# Patient Record
Sex: Female | Born: 1935 | Race: White | Hispanic: No | Smoking: Former smoker
Health system: Southern US, Community
[De-identification: ages and names within clinical notes are randomized; demographics above are authoritative.]

## PROBLEM LIST (undated history)

## (undated) ENCOUNTER — Inpatient Hospital Stay: Admission: AD | Payer: Self-pay | Admitting: Internal Medicine

## (undated) DIAGNOSIS — I4891 Unspecified atrial fibrillation: Secondary | ICD-10-CM

## (undated) DIAGNOSIS — E039 Hypothyroidism, unspecified: Secondary | ICD-10-CM

## (undated) DIAGNOSIS — E669 Obesity, unspecified: Secondary | ICD-10-CM

## (undated) DIAGNOSIS — I5031 Acute diastolic (congestive) heart failure: Secondary | ICD-10-CM

## (undated) DIAGNOSIS — F419 Anxiety disorder, unspecified: Secondary | ICD-10-CM

## (undated) DIAGNOSIS — IMO0002 Reserved for concepts with insufficient information to code with codable children: Secondary | ICD-10-CM

## (undated) DIAGNOSIS — Z90710 Acquired absence of both cervix and uterus: Secondary | ICD-10-CM

## (undated) DIAGNOSIS — I739 Peripheral vascular disease, unspecified: Secondary | ICD-10-CM

## (undated) DIAGNOSIS — I10 Essential (primary) hypertension: Secondary | ICD-10-CM

## (undated) DIAGNOSIS — F0390 Unspecified dementia without behavioral disturbance: Secondary | ICD-10-CM

## (undated) HISTORY — PX: HERNIA REPAIR: SHX51

## (undated) HISTORY — DX: Hypothyroidism, unspecified: E03.9

## (undated) HISTORY — DX: Essential (primary) hypertension: I10

## (undated) HISTORY — PX: ABDOMINAL HYSTERECTOMY: SHX81

## (undated) HISTORY — DX: Acute diastolic (congestive) heart failure: I50.31

## (undated) HISTORY — DX: Reserved for concepts with insufficient information to code with codable children: IMO0002

## (undated) HISTORY — PX: CHOLECYSTECTOMY: SHX55

## (undated) HISTORY — DX: Obesity, unspecified: E66.9

## (undated) HISTORY — DX: Acquired absence of both cervix and uterus: Z90.710

## (undated) HISTORY — DX: Anxiety disorder, unspecified: F41.9

---

## 1998-09-19 ENCOUNTER — Encounter: Payer: Self-pay | Admitting: Cardiology

## 2000-09-11 ENCOUNTER — Encounter: Payer: Self-pay | Admitting: Cardiology

## 2009-01-12 ENCOUNTER — Encounter: Payer: Self-pay | Admitting: Cardiology

## 2009-02-22 ENCOUNTER — Encounter: Payer: Self-pay | Admitting: Cardiology

## 2009-04-05 ENCOUNTER — Ambulatory Visit: Payer: Self-pay | Admitting: Cardiology

## 2009-04-05 DIAGNOSIS — R609 Edema, unspecified: Secondary | ICD-10-CM | POA: Insufficient documentation

## 2009-04-05 DIAGNOSIS — I10 Essential (primary) hypertension: Secondary | ICD-10-CM | POA: Insufficient documentation

## 2009-04-05 DIAGNOSIS — R0602 Shortness of breath: Secondary | ICD-10-CM

## 2009-04-05 DIAGNOSIS — Z8674 Personal history of sudden cardiac arrest: Secondary | ICD-10-CM

## 2009-04-06 ENCOUNTER — Telehealth (INDEPENDENT_AMBULATORY_CARE_PROVIDER_SITE_OTHER): Payer: Self-pay | Admitting: *Deleted

## 2009-04-17 ENCOUNTER — Ambulatory Visit: Payer: Self-pay | Admitting: Internal Medicine

## 2009-04-17 ENCOUNTER — Encounter: Payer: Self-pay | Admitting: Cardiology

## 2009-04-17 ENCOUNTER — Ambulatory Visit: Payer: Self-pay

## 2009-04-17 ENCOUNTER — Ambulatory Visit (HOSPITAL_COMMUNITY): Admission: RE | Admit: 2009-04-17 | Discharge: 2009-04-17 | Payer: Self-pay | Admitting: Cardiology

## 2009-04-18 LAB — CONVERTED CEMR LAB
CO2: 31 meq/L (ref 19–32)
Calcium: 9.7 mg/dL (ref 8.4–10.5)

## 2009-04-19 ENCOUNTER — Ambulatory Visit: Payer: Self-pay | Admitting: Cardiology

## 2009-04-19 DIAGNOSIS — R079 Chest pain, unspecified: Secondary | ICD-10-CM

## 2009-04-19 DIAGNOSIS — I426 Alcoholic cardiomyopathy: Secondary | ICD-10-CM

## 2009-04-26 LAB — CONVERTED CEMR LAB
GFR calc non Af Amer: 73.56 mL/min (ref >60)
Glucose, Bld: 104 mg/dL — ABNORMAL HIGH (ref 70–99)
Potassium: 4.1 meq/L (ref 3.5–5.1)
Sodium: 138 meq/L (ref 135–145)

## 2010-04-16 NOTE — Letter (Signed)
Summary: [facility] 1996 - 2002  [facility] 1996 - 2002   Imported By: Roderic Ovens 06/21/2009 16:47:59  _____________________________________________________________________  External Attachment:    Type:   Image     Comment:   External Document

## 2010-04-16 NOTE — Letter (Signed)
Summary: [facility] Surgical Clearance  [facility] Surgical Clearance   Imported By: Roderic Ovens 04/26/2009 11:54:46  _____________________________________________________________________  External Attachment:    Type:   Image     Comment:   External Document

## 2010-04-16 NOTE — Assessment & Plan Note (Signed)
Summary: f/u echo 04-17-09 md will see pt in  [address] office   Visit Type:  Follow-up Primary Provider:  [name], MD   History of Present Illness: 77 yo with history of HTN and diastolic CHF presents for cardiac evaluation prior to cataract surgery.  She has a history of multiple past surgeries for ? abdominal cysts (she is a little vague about exactly what the operations were).  During one of the operations, at [facility] in [place], she says that her "heart stopped."  She is not sure what was done, but she says they kept her in the hospital about 3 extra days.  This was 15-20 years ago.  She says that she was told afterwards to "take it easy" and "eat right."  She has never had any other heart trouble that she is aware of.  She has never passed out or had significant lightheadedness.  She does not get much exercise but does walk in the grocery store.    She is easily fatigued.  She can climb a flight of steps but is short of breath at the top.  Today she tells me that she gets occasional chest tightness along with shortness of breath when walking up an incline.    We have been trying to get records about this ? cardiac arrest from [place] but there is nothing in the records from [place] mentioning it and her prior primary care MD has no documentation. She needs cataract surgery.  Her daughter-in-law thinks that she will be under general anesthesia.    Echocardiogram showed normal LV systolic function with mild diastolic dysfunction.   Labs (10/10): K 4.3, creatinine 0.88, HCT 37.6, TSH normal Labs (2/11): K 4.3, creatinine 0.8, BNP 179  Current Medications (verified): 1)  Alprazolam 0.5 Mg Tabs (Alprazolam) .... Take One Tablet Two Times A Day 2)  Metoprolol Succinate 50 Mg Xr24h-Tab (Metoprolol Succinate) .... Take One Tablet Once Daily 3)  Benazepril-Hydrochlorothiazide 20-12.5 Mg Tabs (Benazepril-Hydrochlorothiazide) .... Take Two  Tablets  Once  Daily 4)  Vitamin D (Ergocalciferol) 50000 Unit Caps (Ergocalciferol) .... Take One Tablet Weekly 5)  Levothyroxine Sodium 100 Mcg Tabs (Levothyroxine Sodium) .... Take One Tablet By Mouth Once Daily 6)  Ketoconazole 200 Mg Tabs (Ketoconazole) .... Take 1/2 Tablet Two Times A Day 7)  Furosemide 40 Mg Tabs (Furosemide) .... Take One Tablet By Mouth Daily. 8)  Aspir-Low 81 Mg Tbec (Aspirin) .... One Tablet Daily 9)  Potassium Chloride Crys Cr 20 Meq Cr-Tabs (Potassium Chloride Crys Cr) .... Take One Tablet By Mouth Daily  Allergies: 1)  ! Morphine 2)  ! Codeine 3)  ! Valium 4)  ! Demerol 5)  ! Penicillin  Past History:  Past Medical History: 1. HTN 2. Anxiety 3. hypothyroidism 4. TAH 5. CCY 6. Multiple surgeries for ? abdominal cysts in [place] 7. "Heart stopped" on the operating table during one of her "cyst" operations, probably 15-20 years ago 8. Obesity.  9. Diastolic CHF: BNP 179 in 1/11.  Echo (1/11) with EF 60%, mild focal basal septal hypertrophy, grade I diasotlic dysfunction, mild left atrial enlargement.   Family History: Reviewed history from 04/05/2009 and no changes required. Father died at 76 from "heart disease" (she does not know any more details.  Son has had stents placed.  She has 2 brothers, neither has had coronary disease.   Social History: Reviewed history from 04/05/2009 and no changes required. Widow, originally from [place], now lives in [place].  She  lives with her son.  Her grandson and his wife live nearby.  She quit smoking at age 44.    Review of Systems       All systems reviewed and negative except as per HPI.   Vital Signs:  Patient profile:   77 year old female Height:      68 inches Weight:      215 pounds BMI:     32.81 Pulse rate:   76 / minute BP sitting:   136 / 82  (left arm)  Vitals Entered By: Laurance Flatten CMA (April 19, 2009 10:01 AM)  Physical Exam  General:  Well developed, well nourished, in no acute distress.  Obese.  Neck:  Neck supple, JVP 7-8 cm. No masses, thyromegaly or abnormal cervical nodes. Lungs:  Clear bilaterally to auscultation and percussion. Heart:  Non-displaced PMI, chest non-tender; regular rate and rhythm, S1, S2 without  rubs or gallops. Carotid upstroke normal, no bruit. 2+ edema 2/3 to knees left > right lower legs.  Difficult to feel pedal pulses due to edema.   Abdomen:  Bowel sounds positive; abdomen soft and non-tender without masses, organomegaly, or hernias noted. No hepatosplenomegaly. Extremities:  No clubbing or cyanosis. Neurologic:  Alert and oriented x 3. Psych:  Normal affect.   Impression & Recommendations:  Problem # 1:  CHEST PAIN UNSPECIFIED (ICD-786.50) Patient now reports occasional episodes of exertional chest tightness (usually when going up an incline).  She will continue ASA.  I will risk stratify her with a Lexiscan myoview given diastolic CHF, exertional chest tightness, and need for cataract surgery with history of ? perioperative cardiac arrest.   Problem # 2:  PERSONAL HISTORY OF SUDDEN CARDIAC ARREST (ICD-V12.53) Patient has history of "heart stopping" 15-20 years ago during surgery.  I do not have any further details of this.  We have tried to get records from [place] about this but have not been able to find anything either from [facility] or her primary MD in [place] (Dr. Christophe Louis).  I think that the safest course, especially given her diastolic CHF and occasional exertional chest tightness, will be to do a Lexiscan myoview to assess for any ischemia.  Her echo showed normal LV systolic function and mild diastolic dysfunction.  If the myoview is low risk, she should be able to undergo surgery with no further workup.    Problem # 3:  DYSPNEA (ICD-786.05) Suspect diastolic CHF given elevated BNP and normal EF by echo.  I will have her increase Lasix to 40 mg daily as she does appear somewhat volume overloaded.  Will add KCl 20  mEq daily.  BMET and BNP in 2 wks.   Other Orders: Nuclear Stress Test (Nuc Stress Test) TLB-BMP (Basic Metabolic Panel-BMET) (80048-METABOL) TLB-BNP (B-Natriuretic Peptide) (83880-BNPR)  Patient Instructions: 1)  Your physician recommends that you schedule a follow-up appointment in: follow up 1 month at [place] [facility] 2)  Your physician has recommended you make the following change in your medication: please increase lasix to 40mg  once daily and KCL 20mg  1tab by mouth qd 3)  Your physician wants you to follow-up in:   You will receive a reminder letter in the mail two months in advance. If you don't receive a letter, please call our office to schedule the follow-up appointment. 4)  Your physician has requested that you have an exercise stress myoview.  For further information please visit [email or web address]  Please follow instruction sheet, as given. Prescriptions: POTASSIUM  CHLORIDE CRYS CR 20 MEQ CR-TABS (POTASSIUM CHLORIDE CRYS CR) Take one tablet by mouth daily  #30 x 10   Entered by:   Ledon Snare, RN   Authorized by:   Marca Ancona, MD   Signed by:   Ledon Snare, RN on 04/19/2009   Method used:   [facility]  [place] [address]       [address]       [place], [place]  [address]       Ph: [phone] or [phone]       Fax: [phone]   RxID:   [id number] FUROSEMIDE 40 MG TABS (FUROSEMIDE) Take one tablet by mouth daily.  #30 x 10   Entered by:   Ledon Snare, RN   Authorized by:   Marca Ancona, MD   Signed by:   Ledon Snare, RN on 04/19/2009   Method used:   [facility]        [facility]  [place] [address] [facility]       [address]       [place], [place]  [address]       Ph: [phone] or [phone]       Fax: [phone]   RxID:   [id number]

## 2010-04-16 NOTE — Progress Notes (Signed)
  Faxed ROi over today, Recieved Records today forwarded to [place]. Cala Bradford Mesiemore  April 06, 2009 11:26 AM

## 2010-04-16 NOTE — Assessment & Plan Note (Signed)
Summary: NP6/CARDIAC EVAL/JML   Primary Provider:  [name], MD  CC:  new patient.  Cardiac evaluation.  Pt went to have procedure on eye and was referred for cardiac consult as she had a prior surgery during which her heart stopped.  Tina West  History of Present Illness: 77 yo with history of HTN presents for cardiac evaluation prior to cataract surgery.  She has a history of multiple past surgeries for ? abdominal cysts (she is a little vague about exactly what the operations were).  During one of the operations, at [facility] [place] in [place], she says that her "heart stopped."  She is not sure what was done, but she says they kept her in the hospital about 3 extra days.  This was 15-20 years ago.  She says that she was told afterwards to "take it easy" and "eat right."  She has never had any other heart trouble that she is aware of.  She has never had exertional chest pain.  She gets a fluttering sensation in her chest when she is excited or under stress.  She has never passed out or had significant lightheadedness.  She does not get much exercise but does walk in the grocery store.  She is easily fatigued.  She can climb a flight of steps but is short of breath at the top.   ECG: NSR, normal  Labs (10/10): K 4.3, creatinine 0.88, HCT 37.6, TSH normal  Current Medications (verified): 1)  Alprazolam 0.5 Mg Tabs (Alprazolam) .... Take One Tablet Two Times A Day 2)  Folic Acid 1 Mg Tabs (Folic Acid) .... Take One Tablet Once Daily 3)  Metoprolol Succinate 50 Mg Xr24h-Tab (Metoprolol Succinate) .... Take One Tablet Once Daily 4)  Benazepril-Hydrochlorothiazide 20-12.5 Mg Tabs (Benazepril-Hydrochlorothiazide) .... Take Two  Tablets  Once Daily 5)  Vitamin D (Ergocalciferol) 50000 Unit Caps (Ergocalciferol) .... Take One Tablet Weekly 6)  Levothyroxine Sodium 100 Mcg Tabs (Levothyroxine Sodium) .... Take One Tablet By Mouth Once Daily 7)  Ketoconazole 200 Mg Tabs (Ketoconazole) .... Take 1/2  Tablet Two Times A Day 8)  Furosemide 20 Mg Tabs (Furosemide) .... Take One Tablet Once Daily 9)  Aspir-Low 81 Mg Tbec (Aspirin) .... One Tablet Daily  Allergies (verified): 1)  ! Morphine 2)  ! Codeine 3)  ! Valium 4)  ! Demerol 5)  ! Penicillin  Past History:  Past Medical History: 1. HTN 2. Anxiety 3. hypothyroidism 4. TAH 5. CCY 6. Multiple surgeries for ? abdominal cysts in [place] 7. "Heart stopped" on the operating table during one of her "cyst" operations, probably 15-20 years ago 8. Obesity.   Family History: Father died at 28 from "heart disease" (she does not know any more details.  Son has had stents placed.  She has 2 brothers, neither has had coronary disease.   Social History: Widow, originally from [place], now lives in [place].  She lives with her son.  Her grandson and his wife live nearby.  She quit smoking at age 89.    Review of Systems       All systems reviewed and negative except as per HPI.   Vital Signs:  Patient profile:   77 year old female Height:      68 inches Weight:      220 pounds BMI:     33.57 Pulse rate:   70 / minute Pulse rhythm:   regular BP sitting:   160 / 94  (right arm) Cuff size:  large  Vitals Entered By: Judithe Modest CMA (April 05, 2009 2:59 PM)  Physical Exam  General:  Well developed, well nourished, in no acute distress. Obese.  Head:  normocephalic and atraumatic Nose:  no deformity, discharge, inflammation, or lesions Mouth:  Teeth, gums and palate normal. Oral mucosa normal. Neck:  Neck supple, no JVD. No masses, thyromegaly or abnormal cervical nodes. Lungs:  Clear bilaterally to auscultation and percussion. Heart:  Non-displaced PMI, chest non-tender; regular rate and rhythm, S1, S2 without  rubs or gallops. Carotid upstroke normal, no bruit. 2+ edema 2/3 to knees left > right lower legs.  Difficult to feel pedal pulses due to edema.   Abdomen:  Bowel sounds positive; abdomen soft and non-tender  without masses, organomegaly, or hernias noted. No hepatosplenomegaly. Msk:  Back normal, normal gait. Muscle strength and tone normal. Extremities:  No clubbing or cyanosis. Neurologic:  Alert and oriented x 3. Skin:  Intact without lesions or rashes. Psych:  Normal affect.   Impression & Recommendations:  Problem # 1:  PERSONAL HISTORY OF SUDDEN CARDIAC ARREST (ICD-V12.53) Patient has history of "heart stopping" 15-20 years ago during surgery.  I do not have any further details of this.  Before she undergoes anesthesia again for her cataract surgery, we will need more details of exactly what happened at that time.  We will try to obtain the records from [place].  In the mean time, I will have her start ASA 81 mg daily and will get an echocardiogram to assess LV function.  There is no definite indication at this time for a stress test but she may need one based on the records from [place] or based on echo findings.    Problem # 2:  DYSPNEA (ICD-786.05) Patient has mild dyspnea on exertion and quite significant lower extremity edema.  Her JVP is not elevated, so the edema may be more due to obesity and venous insufficiency.  Will assess LV systolic and diastolic function on echo.  Will get a BNP.    Problem # 3:  HYPERTENSION, UNSPECIFIED (ICD-401.9) BP is elevated today to 160/94 and was 178/73 when she saw her PCP last.  I will have her increase her lisinopril/HCTZ to 40/25.  Will get a BMET in 10 days.    Patient will followup in 2 weeks.  Will get her last lipids from Dr [facility] office.   Other Orders: EKG w/ Interpretation (93[address]) Echocardiogram (Echo)  Patient Instructions: 1)  Your physician has recommended you make the following change in your medication:  2)  Increase Lisinopril/HCT to 20/12.5 two tablets daily--this will be 40/25mg  daily 3)  Start Apirin 81mg  daily 4)  Your physician has requested that you have an echocardiogram.  Echocardiography is a painless test that uses  sound waves to create images of your heart. It provides your doctor with information about the size and shape of your heart and how well your heart's chambers and valves are working.  This procedure takes approximately one hour. There are no restrictions for this procedure.  Tuesday February 1,2011 at 10:30 5)  Your physician recommends that you return for lab work  in 10days when you have the echocardiogram  BMP/BNP/783.3 v58.69  Tuesday February 1,2011 at 10:30 6)  Your physician recommends that you schedule a follow-up appointment in: 2 weeks with Dr Dario Ave 3,2011 10am Prescriptions: BENAZEPRIL-HYDROCHLOROTHIAZIDE 20-12.5 MG TABS (BENAZEPRIL-HYDROCHLOROTHIAZIDE) take two  tablets  once daily  #60 x 3   Entered by:   Katina Dung, RN,  BSN   Authorized by:   Marca Ancona, MD   Signed by:   Katina Dung, RN, BSN on 04/05/2009   Method used:   [facility]  [place] [address]       [address]       [place], [place]  [address]       Ph: [phone] or [phone]       Fax: [phone]   RxID:   [id number]

## 2010-04-16 NOTE — Progress Notes (Signed)
Summary: Patients Hand Written Medical History  Patients Medical History   Imported By: Roderic Ovens 04/26/2009 10:14:30  _____________________________________________________________________  External Attachment:    Type:   Image     Comment:   External Document

## 2010-04-16 NOTE — Letter (Signed)
Summary: [facility], [place], [place] 9147 - 2002  [facility] 200 - 2002   Imported By: Roderic Ovens 04/26/2009 10:16:23  _____________________________________________________________________  External Attachment:    Type:   Image     Comment:   External Document

## 2010-10-29 ENCOUNTER — Encounter: Payer: Self-pay | Admitting: Cardiology

## 2012-07-28 ENCOUNTER — Other Ambulatory Visit: Payer: Self-pay

## 2012-07-28 MED ORDER — FUROSEMIDE 40 MG PO TABS: 40.0000 mg | ORAL_TABLET | Freq: Every day | ORAL | Status: DC

## 2012-10-19 ENCOUNTER — Other Ambulatory Visit: Payer: Self-pay | Admitting: Family Medicine

## 2014-07-14 ENCOUNTER — Encounter (INDEPENDENT_AMBULATORY_CARE_PROVIDER_SITE_OTHER): Payer: Self-pay

## 2014-07-14 ENCOUNTER — Ambulatory Visit (INDEPENDENT_AMBULATORY_CARE_PROVIDER_SITE_OTHER): Payer: Medicare PPO | Admitting: Family Medicine

## 2014-07-14 ENCOUNTER — Encounter: Payer: Self-pay | Admitting: Family Medicine

## 2014-07-14 VITALS — BP 205/104 | HR 102 | Temp 97.1°F | Ht 64.0 in | Wt 173.0 lb

## 2014-07-14 DIAGNOSIS — E038 Other specified hypothyroidism: Secondary | ICD-10-CM | POA: Diagnosis not present

## 2014-07-14 DIAGNOSIS — I10 Essential (primary) hypertension: Secondary | ICD-10-CM | POA: Diagnosis not present

## 2014-07-14 LAB — POCT CBC
GRANULOCYTE PERCENT: 53.6 % (ref 37–80)
HEMATOCRIT: 40.1 % (ref 37.7–47.9)
Hemoglobin: 12.3 g/dL (ref 12.2–16.2)
Lymph, poc: 2 (ref 0.6–3.4)
MCH: 25.9 pg — AB (ref 27–31.2)
MCHC: 30.6 g/dL — AB (ref 31.8–35.4)
MCV: 84.5 fL (ref 80–97)
MPV: 7.7 fL (ref 0–99.8)
PLATELET COUNT, POC: 490 10*3/uL — AB (ref 142–424)
POC GRANULOCYTE: 2.9 (ref 2–6.9)
POC LYMPH PERCENT: 37.6 %L (ref 10–50)
RBC: 4.75 M/uL (ref 4.04–5.48)
RDW, POC: 15.1 %
WBC: 5.4 10*3/uL (ref 4.6–10.2)

## 2014-07-14 MED ORDER — ALPRAZOLAM 0.25 MG PO TABS: 0.2500 mg | ORAL_TABLET | Freq: Two times a day (BID) | ORAL | Status: AC

## 2014-07-14 MED ORDER — DONEPEZIL HCL 5 MG PO TABS: 5.0000 mg | ORAL_TABLET | Freq: Every day | ORAL | Status: DC

## 2014-07-14 NOTE — Progress Notes (Signed)
Subjective:  Patient ID: Tina West, female    DOB: 08-06-1928  Age: 81 y.o. MRN: [id number]  CC: Hypertension and GAD   HPI Tina West presents for  follow-up of hypertension. Patient has no history of headache chest pain or shortness of breath or recent cough. Patient also denies symptoms of TIA such as numbness weakness lateralizing. Patient checks  blood pressure at home and has not had any elevated readings recently. Patient denies side effects from his medication. States taking it regularly.  Patient presents for follow-up on  thyroid. She has a history of hypothyroidism for many years. It has been stable recently. Pt. denies any change in  voice, loss of hair, heat or cold intolerance. Energy level has been adequate to good. She denies constipation and diarrhea. No myxedema. Medication is as noted below. Verified that pt is taking it daily on an empty stomach. Well tolerated.  PAranoia toward family stealing.     History Tina West has a past medical history of HTN (hypertension); Anxiety; Hypothyroidism; S/P TAH (total abdominal hysterectomy); Abdominal cyst; Obesity; and Diastolic CHF, acute.   She has past surgical history that includes Abdominal hysterectomy; Cholecystectomy; and Hernia repair.   Her family history includes Heart disease in her father.She reports that she has quit smoking. She does not have any smokeless tobacco history on file. Her alcohol and drug histories are not on file.  Current Outpatient Prescriptions on File Prior to Visit  Medication Sig Dispense Refill  . aspirin (ASPIR-LOW) 81 MG EC tablet Take 81 mg by mouth daily.      . benazepril-hydrochlorthiazide (LOTENSIN HCT) 20-12.5 MG per tablet Take 1 tablet by mouth 2 (two) times daily.      . ergocalciferol (VITAMIN D2) 50000 UNITS capsule Take 50,000 Units by mouth once a week.      . furosemide (LASIX) 40 MG tablet TAKE 1 TABLET (40 MG TOTAL) BY MOUTH DAILY. (Patient not taking: Reported on  07/14/2014) 30 tablet 2  . ketoconazole (NIZORAL) 200 MG tablet Take 100 mg by mouth 2 (two) times daily.      Marland Kitchen levothyroxine (SYNTHROID, LEVOTHROID) 100 MCG tablet Take 100 mcg by mouth daily.      . metoprolol (TOPROL-XL) 50 MG 24 hr tablet Take 50 mg by mouth daily.      . potassium chloride SA (K-DUR,KLOR-CON) 20 MEQ tablet Take 20 mEq by mouth daily.       No current facility-administered medications on file prior to visit.    ROS Review of Systems  Constitutional: Negative for fever, chills, diaphoresis, appetite change, fatigue and unexpected weight change.  HENT: Negative for congestion, ear pain, hearing loss, postnasal drip, rhinorrhea, sneezing, sore throat and trouble swallowing.   Eyes: Negative for pain.  Respiratory: Negative for cough, chest tightness and shortness of breath.   Cardiovascular: Negative for chest pain and palpitations.  Gastrointestinal: Negative for nausea, vomiting, abdominal pain, diarrhea and constipation.  Genitourinary: Negative for dysuria, frequency and menstrual problem.  Musculoskeletal: Negative for joint swelling and arthralgias.  Skin: Negative for rash.  Neurological: Negative for dizziness, weakness, numbness and headaches.  Psychiatric/Behavioral: Negative for dysphoric mood and agitation.    Objective:  BP 205/104 mmHg  Pulse 102  Temp(Src) 97.1 F (36.2 C) (Oral)  Ht 5\' 4"  (1.626 m)  Wt 173 lb (78.472 kg)  BMI 29.68 kg/m2  BP Readings from Last 3 Encounters:  07/14/14 205/104  04/19/09 136/82  04/05/09 160/94    Wt Readings from Last 3 Encounters:  07/14/14 173 lb (78.472 kg)  04/19/09 215 lb (97.523 kg)  04/05/09 220 lb (99.791 kg)     Physical Exam  Constitutional: She is oriented to person, place, and time. She appears well-developed and well-nourished. No distress.  HENT:  Head: Normocephalic and atraumatic.  Right Ear: External ear normal.  Left Ear: External ear normal.  Nose: Nose normal.  Mouth/Throat:  Oropharynx is clear and moist.  Eyes: Conjunctivae and EOM are normal. Pupils are equal, round, and reactive to light.  Neck: Normal range of motion. Neck supple. No thyromegaly present.  Cardiovascular: Normal rate, regular rhythm and normal heart sounds.   No murmur heard. Pulmonary/Chest: Effort normal and breath sounds normal. No respiratory distress. She has no wheezes. She has no rales.  Abdominal: Soft. Bowel sounds are normal. She exhibits no distension. There is no tenderness.  Lymphadenopathy:    She has no cervical adenopathy.  Neurological: She is alert and oriented to person, place, and time. She has normal reflexes.  Skin: Skin is warm and dry.  Psychiatric: She has a normal mood and affect. Her behavior is normal. Judgment and thought content normal.    No results found for: HGBA1C  Lab Results  Component Value Date   GLUCOSE 104* 04/19/2009   NA 138 04/19/2009   K 4.1 04/19/2009   CL 103 04/19/2009   CREATININE 0.8 04/19/2009   BUN 18 04/19/2009   CO2 30 04/19/2009    No results found.  Assessment & Plan:   Tina West was seen today for hypertension and gad.  Diagnoses and all orders for this visit:  Essential hypertension  Other specified hypothyroidism  Other orders -     donepezil (ARICEPT) 5 MG tablet; Take 1 tablet (5 mg total) by mouth at bedtime. -     ALPRAZolam (XANAX) 0.25 MG tablet; Take 1 tablet (0.25 mg total) by mouth 2 (two) times daily.  I have changed Tina West's ALPRAZolam. I am also having her start on donepezil. Additionally, I am having her maintain her aspirin, benazepril-hydrochlorthiazide, ketoconazole, levothyroxine, metoprolol succinate, potassium chloride SA, ergocalciferol, and furosemide.  Meds ordered this encounter  Medications  . donepezil (ARICEPT) 5 MG tablet    Sig: Take 1 tablet (5 mg total) by mouth at bedtime.    Dispense:  30 tablet    Refill:  2  . ALPRAZolam (XANAX) 0.25 MG tablet    Sig: Take 1 tablet (0.25  mg total) by mouth 2 (two) times daily.    Dispense:  60 tablet    Refill:  2     Follow-up: Return in about 6 weeks (around 08/25/2014).  Claretta Fraise, M.D.

## 2014-07-15 LAB — CMP14+EGFR
A/G RATIO: 1.9 (ref 1.1–2.5)
ALT: 11 IU/L (ref 0–32)
AST: 16 IU/L (ref 0–40)
Albumin: 4.6 g/dL (ref 3.5–4.7)
Alkaline Phosphatase: 74 IU/L (ref 39–117)
BUN/Creatinine Ratio: 19 (ref 11–26)
BUN: 13 mg/dL (ref 8–27)
Bilirubin Total: 0.9 mg/dL (ref 0.0–1.2)
CALCIUM: 9.7 mg/dL (ref 8.7–10.3)
CO2: 24 mmol/L (ref 18–29)
Chloride: 98 mmol/L (ref 97–108)
Creatinine, Ser: 0.69 mg/dL (ref 0.57–1.00)
GFR calc Af Amer: 92 mL/min/{1.73_m2} (ref >59)
GFR calc non Af Amer: 80 mL/min/{1.73_m2} (ref >59)
Globulin, Total: 2.4 g/dL (ref 1.5–4.5)
Glucose: 99 mg/dL (ref 65–99)
POTASSIUM: 4.2 mmol/L (ref 3.5–5.2)
SODIUM: 139 mmol/L (ref 134–144)
Total Protein: 7 g/dL (ref 6.0–8.5)

## 2014-07-15 LAB — THYROID PANEL WITH TSH
Free Thyroxine Index: 1.2 (ref 1.2–4.9)
T3 UPTAKE RATIO: 21 % — AB (ref 24–39)
T4 TOTAL: 5.5 ug/dL (ref 4.5–12.0)
TSH: 10.23 u[IU]/mL — ABNORMAL HIGH (ref 0.450–4.500)

## 2014-07-16 ENCOUNTER — Other Ambulatory Visit: Payer: Self-pay | Admitting: Family Medicine

## 2014-07-16 MED ORDER — LEVOTHYROXINE SODIUM 125 MCG PO TABS: 125.0000 ug | ORAL_TABLET | Freq: Every day | ORAL | Status: DC

## 2014-07-16 NOTE — Addendum Note (Signed)
Addended by: Claretta Fraise on: 07/16/2014 10:19 PM   Modules accepted: Miquel Dunn

## 2014-08-21 ENCOUNTER — Ambulatory Visit (INDEPENDENT_AMBULATORY_CARE_PROVIDER_SITE_OTHER): Payer: Medicare PPO | Admitting: Family Medicine

## 2014-08-21 ENCOUNTER — Encounter (INDEPENDENT_AMBULATORY_CARE_PROVIDER_SITE_OTHER): Payer: Self-pay

## 2014-08-21 ENCOUNTER — Encounter: Payer: Self-pay | Admitting: Family Medicine

## 2014-08-21 VITALS — BP 178/108 | HR 107 | Temp 97.3°F | Ht 64.0 in | Wt 170.0 lb

## 2014-08-21 DIAGNOSIS — I10 Essential (primary) hypertension: Secondary | ICD-10-CM | POA: Diagnosis not present

## 2014-08-21 DIAGNOSIS — E038 Other specified hypothyroidism: Secondary | ICD-10-CM | POA: Diagnosis not present

## 2014-08-21 DIAGNOSIS — G309 Alzheimer's disease, unspecified: Secondary | ICD-10-CM | POA: Diagnosis not present

## 2014-08-21 DIAGNOSIS — F0280 Dementia in other diseases classified elsewhere without behavioral disturbance: Secondary | ICD-10-CM

## 2014-08-21 MED ORDER — DONEPEZIL HCL 10 MG PO TABS: 5.0000 mg | ORAL_TABLET | Freq: Every day | ORAL | Status: DC

## 2014-08-21 MED ORDER — LEVOTHYROXINE SODIUM 125 MCG PO TABS: 125.0000 ug | ORAL_TABLET | Freq: Every day | ORAL | Status: DC

## 2014-08-21 NOTE — Progress Notes (Signed)
Subjective:  Patient ID: Tina West, female    DOB: [birth year]  Age: 81 y.o. MRN: [id number]  CC: No chief complaint on file.   HPI Tina West presents for Follow up of use of donepezil denies any side effects of the medication. Feels that is helping her mind to clear somewhat.  Patient presents for follow-up on  thyroid. She has a history of hypothyroidism for many years. Patient did not increase the medication as recommended after previous visit.. Pt. denies any change in  voice, loss of hair, heat or cold intolerance. Energy level has been adequate to good. She denies constipation and diarrhea. No myxedema. Medication is still 100 g by mouth daily rather than the stated dose noted below.. Verified that pt is taking it daily on an empty stomach. Well tolerated.  History Tina West has a past medical history of HTN (hypertension); Anxiety; Hypothyroidism; S/P TAH (total abdominal hysterectomy); Abdominal cyst; Obesity; and Diastolic CHF, acute.   She has past surgical history that includes Abdominal hysterectomy; Cholecystectomy; and Hernia repair.   Her family history includes Heart disease in her father.She reports that she has quit smoking. She does not have any smokeless tobacco history on file. Her alcohol and drug histories are not on file.  Outpatient Prescriptions Prior to Visit  Medication Sig Dispense Refill  . ALPRAZolam (XANAX) 0.25 MG tablet Take 1 tablet (0.25 mg total) by mouth 2 (two) times daily. 60 tablet 2  . aspirin (ASPIR-LOW) 81 MG EC tablet Take 81 mg by mouth daily.      . benazepril-hydrochlorthiazide (LOTENSIN HCT) 20-12.5 MG per tablet Take 1 tablet by mouth 2 (two) times daily.      . metoprolol (TOPROL-XL) 50 MG 24 hr tablet Take 50 mg by mouth daily.      Marland Kitchen donepezil (ARICEPT) 5 MG tablet Take 1 tablet (5 mg total) by mouth at bedtime. 30 tablet 2  . ergocalciferol (VITAMIN D2) 50000 UNITS capsule Take 50,000 Units by mouth once a week.      .  furosemide (LASIX) 40 MG tablet TAKE 1 TABLET (40 MG TOTAL) BY MOUTH DAILY. (Patient not taking: Reported on 07/14/2014) 30 tablet 2  . ketoconazole (NIZORAL) 200 MG tablet Take 100 mg by mouth 2 (two) times daily.      . potassium chloride SA (K-DUR,KLOR-CON) 20 MEQ tablet Take 20 mEq by mouth daily.      Marland Kitchen levothyroxine (SYNTHROID, LEVOTHROID) 125 MCG tablet Take 1 tablet (125 mcg total) by mouth daily. (Patient not taking: Reported on 08/21/2014) 30 tablet 2   No facility-administered medications prior to visit.    ROS Review of Systems  Constitutional: Negative for fever, chills, diaphoresis, appetite change, fatigue and unexpected weight change.  HENT: Negative for congestion, ear pain, hearing loss, postnasal drip, rhinorrhea, sneezing, sore throat and trouble swallowing.   Eyes: Negative for pain.  Respiratory: Negative for cough, chest tightness and shortness of breath.   Cardiovascular: Negative for chest pain and palpitations.  Gastrointestinal: Negative for nausea, vomiting, abdominal pain, diarrhea and constipation.  Genitourinary: Negative for dysuria, frequency and menstrual problem.  Musculoskeletal: Negative for joint swelling and arthralgias.  Skin: Negative for rash.  Neurological: Negative for dizziness, weakness, numbness and headaches.  Psychiatric/Behavioral: Positive for behavioral problems and confusion. Negative for dysphoric mood and agitation.    Objective:  BP 178/108 mmHg  Pulse 107  Temp(Src) 97.3 F (36.3 C) (Oral)  Ht 5\' 4"  (1.626 m)  Wt 170 lb (77.111 kg)  BMI 29.17  kg/m2  BP Readings from Last 3 Encounters:  08/21/14 178/108  07/14/14 205/104  04/19/09 136/82    Wt Readings from Last 3 Encounters:  08/21/14 170 lb (77.111 kg)  07/14/14 173 lb (78.472 kg)  04/19/09 215 lb (97.523 kg)     Physical Exam  Constitutional: She is oriented to person, place, and time. She appears well-developed and well-nourished. No distress.  HENT:  Head:  Normocephalic and atraumatic.  Right Ear: External ear normal.  Left Ear: External ear normal.  Nose: Nose normal.  Mouth/Throat: Oropharynx is clear and moist.  Eyes: Conjunctivae and EOM are normal. Pupils are equal, round, and reactive to light.  Neck: Normal range of motion. Neck supple. No thyromegaly present.  Cardiovascular: Normal rate, regular rhythm and normal heart sounds.   No murmur heard. Pulmonary/Chest: Effort normal and breath sounds normal. No respiratory distress. She has no wheezes. She has no rales.  Abdominal: Soft. Bowel sounds are normal. She exhibits no distension. There is no tenderness.  Musculoskeletal:  dorsal kyphosis  Lymphadenopathy:    She has no cervical adenopathy.  Neurological: She is alert and oriented to person, place, and time. She has normal reflexes.  Skin: Skin is warm and dry.  Psychiatric: She has a normal mood and affect. Her behavior is normal.  Patient has tangential thinking and unable to remain on topic. However she is cognizant and responds appropriately with appropriate memory but impaired judgment and association    No results found for: HGBA1C  Lab Results  Component Value Date   WBC 5.4 07/14/2014   HGB 12.3 07/14/2014   HCT 40.1 07/14/2014   GLUCOSE 99 07/14/2014   ALT 11 07/14/2014   AST 16 07/14/2014   NA 139 07/14/2014   K 4.2 07/14/2014   CL 98 07/14/2014   CREATININE 0.69 07/14/2014   BUN 13 07/14/2014   CO2 24 07/14/2014   TSH 10.230* 07/14/2014    No results found.  Assessment & Plan:   Diagnoses and all orders for this visit:  Other specified hypothyroidism  Essential hypertension  Alzheimer's disease  Other orders -     levothyroxine (SYNTHROID, LEVOTHROID) 125 MCG tablet; Take 1 tablet (125 mcg total) by mouth daily. -     donepezil (ARICEPT) 10 MG tablet; Take 0.5 tablets (5 mg total) by mouth at bedtime.  I have changed Tina West's donepezil. I am also having her maintain her aspirin,  benazepril-hydrochlorthiazide, ketoconazole, metoprolol succinate, potassium chloride SA, ergocalciferol, furosemide, ALPRAZolam, and levothyroxine.  Meds ordered this encounter  Medications  . levothyroxine (SYNTHROID, LEVOTHROID) 125 MCG tablet    Sig: Take 1 tablet (125 mcg total) by mouth daily.    Dispense:  30 tablet    Refill:  2  . donepezil (ARICEPT) 10 MG tablet    Sig: Take 0.5 tablets (5 mg total) by mouth at bedtime.    Dispense:  30 tablet    Refill:  2   Encouraged patient to take the dose of medication as previously directed.  Follow-up: Return in about 2 months (around 10/21/2014) for thyroid.  Claretta Fraise, M.D.

## 2014-09-19 ENCOUNTER — Encounter: Payer: Self-pay | Admitting: *Deleted

## 2014-10-23 ENCOUNTER — Ambulatory Visit: Payer: Medicare PPO | Admitting: Family Medicine

## 2014-10-25 ENCOUNTER — Ambulatory Visit (INDEPENDENT_AMBULATORY_CARE_PROVIDER_SITE_OTHER): Payer: Medicare PPO | Admitting: Family Medicine

## 2014-10-25 ENCOUNTER — Encounter: Payer: Self-pay | Admitting: Family Medicine

## 2014-10-25 VITALS — BP 187/101 | HR 104 | Temp 97.2°F | Ht 64.0 in | Wt 162.4 lb

## 2014-10-25 DIAGNOSIS — I10 Essential (primary) hypertension: Secondary | ICD-10-CM

## 2014-10-25 DIAGNOSIS — E038 Other specified hypothyroidism: Secondary | ICD-10-CM | POA: Diagnosis not present

## 2014-10-25 MED ORDER — FUROSEMIDE 40 MG PO TABS: ORAL_TABLET | ORAL | Status: DC

## 2014-10-25 MED ORDER — BENAZEPRIL-HYDROCHLOROTHIAZIDE 20-12.5 MG PO TABS: 1.0000 | ORAL_TABLET | Freq: Two times a day (BID) | ORAL | Status: DC

## 2014-10-25 MED ORDER — METOPROLOL SUCCINATE ER 100 MG PO TB24: 100.0000 mg | ORAL_TABLET | Freq: Every day | ORAL | Status: DC

## 2014-10-25 MED ORDER — METOPROLOL SUCCINATE ER 50 MG PO TB24: 50.0000 mg | ORAL_TABLET | Freq: Every day | ORAL | Status: DC

## 2014-10-25 NOTE — Progress Notes (Addendum)
Subjective:  Patient ID: Tina West, female    DOB: 09/04/1928  Age: 81 y.o. MRN: [id number]  CC: Hypertension and Hypothyroidism   HPI Tina West presents for  follow-up of hypertension. Patient has no history of headache chest pain or shortness of breath or recent cough. Patient also denies symptoms of TIA such as numbness weakness lateralizing Patient denies side effects from his medication. States taking it regularly. Patient presents for follow-up on  thyroid. She has a history of hypothyroidism for many years. It has been stable recently. Pt. denies any change in  voice, loss of hair, heat or cold intolerance. Energy level has been adequate to good. She denies constipation and diarrhea. No myxedema. Medication is as noted below. Verified that pt is taking it daily on an empty stomach. Well tolerated.  History Tina West has a past medical history of HTN (hypertension); Anxiety; Hypothyroidism; S/P TAH (total abdominal hysterectomy); Abdominal cyst; Obesity; and Diastolic CHF, acute.   She has past surgical history that includes Abdominal hysterectomy; Cholecystectomy; and Hernia repair.   Her family history includes Heart disease in her father.She reports that she has quit smoking. She does not have any smokeless tobacco history on file. Her alcohol and drug histories are not on file.  Outpatient Prescriptions Prior to Visit  Medication Sig Dispense Refill  . ALPRAZolam (XANAX) 0.25 MG tablet Take 1 tablet (0.25 mg total) by mouth 2 (two) times daily. (Patient not taking: Reported on 10/25/2014) 60 tablet 2  . aspirin (ASPIR-LOW) 81 MG EC tablet Take 81 mg by mouth daily.      Marland Kitchen donepezil (ARICEPT) 10 MG tablet Take 0.5 tablets (5 mg total) by mouth at bedtime. 30 tablet 2  . ergocalciferol (VITAMIN D2) 50000 UNITS capsule Take 50,000 Units by mouth once a week.      Marland Kitchen ketoconazole (NIZORAL) 200 MG tablet Take 100 mg by mouth 2 (two) times daily.      . potassium chloride SA  (K-DUR,KLOR-CON) 20 MEQ tablet Take 20 mEq by mouth daily.      . benazepril-hydrochlorthiazide (LOTENSIN HCT) 20-12.5 MG per tablet Take 1 tablet by mouth 2 (two) times daily.      . furosemide (LASIX) 40 MG tablet TAKE 1 TABLET (40 MG TOTAL) BY MOUTH DAILY. (Patient not taking: Reported on 10/25/2014) 30 tablet 2  . levothyroxine (SYNTHROID, LEVOTHROID) 125 MCG tablet Take 1 tablet (125 mcg total) by mouth daily. 30 tablet 2  . metoprolol (TOPROL-XL) 50 MG 24 hr tablet Take 50 mg by mouth daily.       No facility-administered medications prior to visit.    ROS Review of Systems  Constitutional: Negative for fever, chills, diaphoresis, appetite change, fatigue and unexpected weight change.  HENT: Negative for congestion, ear pain, hearing loss, postnasal drip, rhinorrhea, sneezing, sore throat and trouble swallowing.   Eyes: Negative for pain.  Respiratory: Negative for cough, chest tightness and shortness of breath.   Cardiovascular: Negative for chest pain and palpitations.  Gastrointestinal: Negative for nausea, vomiting, abdominal pain, diarrhea and constipation.  Genitourinary: Negative for dysuria, frequency and menstrual problem.  Musculoskeletal: Negative for joint swelling and arthralgias.  Skin: Negative for rash.  Neurological: Negative for dizziness, weakness, numbness and headaches.  Psychiatric/Behavioral: Negative for dysphoric mood and agitation.    Objective:  BP 187/101 mmHg  Pulse 104  Temp(Src) 97.2 F (36.2 C) (Oral)  Ht 5' 4"  (1.626 m)  Wt 162 lb 6.4 oz (73.664 kg)  BMI 27.86 kg/m2  BP Readings  from Last 3 Encounters:  10/25/14 187/101  08/21/14 178/108  07/14/14 205/104    Wt Readings from Last 3 Encounters:  10/25/14 162 lb 6.4 oz (73.664 kg)  08/21/14 170 lb (77.111 kg)  07/14/14 173 lb (78.472 kg)     Physical Exam  Constitutional: She is oriented to person, place, and time. She appears well-developed and well-nourished. No distress.  HENT:    Head: Normocephalic and atraumatic.  Right Ear: External ear normal.  Left Ear: External ear normal.  Nose: Nose normal.  Mouth/Throat: Oropharynx is clear and moist.  Eyes: Conjunctivae and EOM are normal. Pupils are equal, round, and reactive to light.  Neck: Normal range of motion. Neck supple. No thyromegaly present.  Cardiovascular: Normal rate, regular rhythm and normal heart sounds.   No murmur heard. Pulmonary/Chest: Effort normal and breath sounds normal. No respiratory distress. She has no wheezes. She has no rales.  Abdominal: Soft. Bowel sounds are normal. She exhibits no distension. There is no tenderness.  Lymphadenopathy:    She has no cervical adenopathy.  Neurological: She is alert and oriented to person, place, and time. She has normal reflexes.  Skin: Skin is warm and dry.  Psychiatric: She has a normal mood and affect. Her behavior is normal. Judgment and thought content normal.    No results found for: HGBA1C  Lab Results  Component Value Date   WBC 5.4 07/14/2014   HGB 12.3 07/14/2014   HCT 40.1 07/14/2014   GLUCOSE 88 10/25/2014   ALT 7 10/25/2014   AST 21 10/25/2014   NA 140 10/25/2014   K 4.6 10/25/2014   CL 99 10/25/2014   CREATININE 0.69 10/25/2014   BUN 24 10/25/2014   CO2 21 10/25/2014   TSH 6.890* 10/25/2014    No results found.  Assessment & Plan:   Tina West was seen today for hypertension and hypothyroidism.  Diagnoses and all orders for this visit:  Accelerated hypertension  Other specified hypothyroidism -     TSH -     T4, Free  Essential hypertension -     CMP14+EGFR  Other orders -     benazepril-hydrochlorthiazide (LOTENSIN HCT) 20-12.5 MG per tablet; Take 1 tablet by mouth 2 (two) times daily. -     furosemide (LASIX) 40 MG tablet; TAKE 1 TABLET (40 MG TOTAL) BY MOUTH DAILY. -     Discontinue: metoprolol succinate (TOPROL-XL) 50 MG 24 hr tablet; Take 1 tablet (50 mg total) by mouth daily. -     metoprolol succinate  (TOPROL-XL) 100 MG 24 hr tablet; Take 1 tablet (100 mg total) by mouth daily.   I have discontinued [name]'s metoprolol succinate. I have also changed her metoprolol succinate. Additionally, I am having her maintain her aspirin, ketoconazole, potassium chloride SA, ergocalciferol, ALPRAZolam, donepezil, benazepril-hydrochlorthiazide, and furosemide.  Meds ordered this encounter  Medications  . DISCONTD: donepezil (ARICEPT) 5 MG tablet    Sig:   . benazepril-hydrochlorthiazide (LOTENSIN HCT) 20-12.5 MG per tablet    Sig: Take 1 tablet by mouth 2 (two) times daily.    Dispense:  60 tablet    Refill:  11  . furosemide (LASIX) 40 MG tablet    Sig: TAKE 1 TABLET (40 MG TOTAL) BY MOUTH DAILY.    Dispense:  30 tablet    Refill:  5  . DISCONTD: metoprolol succinate (TOPROL-XL) 50 MG 24 hr tablet    Sig: Take 1 tablet (50 mg total) by mouth daily.    Dispense:  30  tablet    Refill:  11  . metoprolol succinate (TOPROL-XL) 100 MG 24 hr tablet    Sig: Take 1 tablet (100 mg total) by mouth daily.    Dispense:  30 tablet    Refill:  2     Follow-up: Return in about 2 weeks (around 11/08/2014) for hypertension.  Claretta Fraise, M.D.

## 2014-10-26 ENCOUNTER — Other Ambulatory Visit: Payer: Self-pay | Admitting: Family Medicine

## 2014-10-26 ENCOUNTER — Encounter: Payer: Self-pay | Admitting: *Deleted

## 2014-10-26 LAB — CMP14+EGFR
A/G RATIO: 1.9 (ref 1.1–2.5)
ALK PHOS: 66 IU/L (ref 39–117)
ALT: 7 IU/L (ref 0–32)
AST: 21 IU/L (ref 0–40)
Albumin: 4.2 g/dL (ref 3.5–4.7)
BUN / CREAT RATIO: 35 — AB (ref 11–26)
BUN: 24 mg/dL (ref 8–27)
Bilirubin Total: 0.5 mg/dL (ref 0.0–1.2)
CO2: 21 mmol/L (ref 18–29)
Calcium: 9.5 mg/dL (ref 8.7–10.3)
Chloride: 99 mmol/L (ref 97–108)
Creatinine, Ser: 0.69 mg/dL (ref 0.57–1.00)
GFR calc Af Amer: 92 mL/min/{1.73_m2} (ref >59)
GFR calc non Af Amer: 80 mL/min/{1.73_m2} (ref >59)
GLOBULIN, TOTAL: 2.2 g/dL (ref 1.5–4.5)
Glucose: 88 mg/dL (ref 65–99)
Potassium: 4.6 mmol/L (ref 3.5–5.2)
Sodium: 140 mmol/L (ref 134–144)
Total Protein: 6.4 g/dL (ref 6.0–8.5)

## 2014-10-26 LAB — TSH: TSH: 6.89 u[IU]/mL — AB (ref 0.450–4.500)

## 2014-10-26 LAB — T4, FREE: Free T4: 0.86 ng/dL (ref 0.82–1.77)

## 2014-10-26 MED ORDER — LEVOTHYROXINE SODIUM 137 MCG PO TABS: 137.0000 ug | ORAL_TABLET | Freq: Every day | ORAL | Status: DC

## 2014-10-26 NOTE — Addendum Note (Signed)
Addended by: Claretta Fraise on: 10/26/2014 07:46 PM   Modules accepted: Miquel Dunn

## 2014-11-08 ENCOUNTER — Ambulatory Visit: Payer: Self-pay | Admitting: Family Medicine

## 2014-11-10 ENCOUNTER — Ambulatory Visit (INDEPENDENT_AMBULATORY_CARE_PROVIDER_SITE_OTHER): Payer: Medicare PPO | Admitting: Family Medicine

## 2014-11-10 ENCOUNTER — Encounter: Payer: Self-pay | Admitting: Family Medicine

## 2014-11-10 VITALS — BP 134/72 | HR 68 | Temp 97.0°F | Ht 64.0 in | Wt 164.0 lb

## 2014-11-10 DIAGNOSIS — F0280 Dementia in other diseases classified elsewhere without behavioral disturbance: Secondary | ICD-10-CM

## 2014-11-10 DIAGNOSIS — E038 Other specified hypothyroidism: Secondary | ICD-10-CM | POA: Diagnosis not present

## 2014-11-10 DIAGNOSIS — I10 Essential (primary) hypertension: Secondary | ICD-10-CM | POA: Diagnosis not present

## 2014-11-10 DIAGNOSIS — B372 Candidiasis of skin and nail: Secondary | ICD-10-CM | POA: Diagnosis not present

## 2014-11-10 DIAGNOSIS — G309 Alzheimer's disease, unspecified: Secondary | ICD-10-CM

## 2014-11-10 MED ORDER — METOPROLOL SUCCINATE ER 100 MG PO TB24: 100.0000 mg | ORAL_TABLET | Freq: Every day | ORAL | Status: DC

## 2014-11-10 MED ORDER — DONEPEZIL HCL 10 MG PO TABS: 5.0000 mg | ORAL_TABLET | Freq: Every day | ORAL | Status: AC

## 2014-11-10 NOTE — Patient Instructions (Signed)
Use lamisil cream for rash in skin fold of mid abdomen

## 2014-11-10 NOTE — Progress Notes (Signed)
Subjective:  Patient ID: Tina West, female    DOB: 04-08-1928  Age: 81 y.o. MRN: [id number]  CC: Hypertension and Hypothyroidism   HPI Geneva Barrero presents for  follow-up of hypertension. Patient has no history of headache chest pain or shortness of breath or recent cough. Patient also denies symptoms of TIA such as numbness weakness lateralizing. Patient not checking blood pressure at home. Patient denies side effects from his medication. States taking it regularly.  Patient presents for follow-up on  thyroid. She has a history of hypothyroidism for many years. It has been stable recently. Pt. denies any change in  voice, loss of hair, heat or cold intolerance. Energy level has been adequate to good. She denies constipation and diarrhea. No myxedema. Medication is as noted below. Verified that pt is taking it daily on an empty stomach. Well tolerated.  Patient become quite nervous and has feelings of panic and impending doom at times. This is well controlled as long she takes the alprazolam. She continues to be rather forgetful. Today she cannot recall the date or day of the week. She does know location. She can recall names. The pain or nor is she expressed in the past has resolved since starting treatment with donepezil.   History Pinky has a past medical history of HTN (hypertension); Anxiety; Hypothyroidism; S/P TAH (total abdominal hysterectomy); Abdominal cyst; Obesity; and Diastolic CHF, acute.   She has past surgical history that includes Abdominal hysterectomy; Cholecystectomy; and Hernia repair.   Her family history includes Heart disease in her father.She reports that she has quit smoking. She does not have any smokeless tobacco history on file. Her alcohol and drug histories are not on file.  Current Outpatient Prescriptions on File Prior to Visit  Medication Sig Dispense Refill  . ALPRAZolam (XANAX) 0.25 MG tablet Take 1 tablet (0.25 mg total) by mouth 2 (two)  times daily. 60 tablet 2  . aspirin (ASPIR-LOW) 81 MG EC tablet Take 81 mg by mouth daily.      . benazepril-hydrochlorthiazide (LOTENSIN HCT) 20-12.5 MG per tablet Take 1 tablet by mouth 2 (two) times daily. 60 tablet 11  . ergocalciferol (VITAMIN D2) 50000 UNITS capsule Take 50,000 Units by mouth once a week.      . furosemide (LASIX) 40 MG tablet TAKE 1 TABLET (40 MG TOTAL) BY MOUTH DAILY. 30 tablet 5  . ketoconazole (NIZORAL) 200 MG tablet Take 100 mg by mouth 2 (two) times daily.      . potassium chloride SA (K-DUR,KLOR-CON) 20 MEQ tablet Take 20 mEq by mouth daily.      Marland Kitchen levothyroxine (SYNTHROID, LEVOTHROID) 137 MCG tablet Take 1 tablet (137 mcg total) by mouth daily. (Patient not taking: Reported on 11/10/2014) 30 tablet 5   No current facility-administered medications on file prior to visit.    ROS Review of Systems  Constitutional: Negative for fever, chills, diaphoresis, appetite change, fatigue and unexpected weight change.  HENT: Negative for congestion, ear pain, hearing loss, postnasal drip, rhinorrhea, sneezing, sore throat and trouble swallowing.   Eyes: Negative for pain.  Respiratory: Negative for cough, chest tightness and shortness of breath.   Cardiovascular: Negative for chest pain and palpitations.  Gastrointestinal: Negative for nausea, vomiting, abdominal pain, diarrhea and constipation.  Genitourinary: Negative for dysuria, frequency and menstrual problem.  Musculoskeletal: Negative for joint swelling and arthralgias.  Skin: Positive for rash.  Neurological: Negative for dizziness, weakness, numbness and headaches.  Psychiatric/Behavioral: Positive for confusion. Negative for dysphoric mood and agitation.  The patient is nervous/anxious.     Objective:  BP 134/72 mmHg  Pulse 68  Temp(Src) 97 F (36.1 C) (Oral)  Ht 5\' 4"  (1.626 m)  Wt 164 lb (74.39 kg)  BMI 28.14 kg/m2  BP Readings from Last 3 Encounters:  11/10/14 134/72  10/25/14 187/101  08/21/14  178/108    Wt Readings from Last 3 Encounters:  11/10/14 164 lb (74.39 kg)  10/25/14 162 lb 6.4 oz (73.664 kg)  08/21/14 170 lb (77.111 kg)     Physical Exam  Constitutional: She is oriented to person, place, and time. She appears well-developed and well-nourished. No distress.  HENT:  Head: Normocephalic and atraumatic.  Right Ear: External ear normal.  Left Ear: External ear normal.  Nose: Nose normal.  Mouth/Throat: Oropharynx is clear and moist.  Eyes: Conjunctivae and EOM are normal. Pupils are equal, round, and reactive to light.  Neck: Normal range of motion. Neck supple. No thyromegaly present.  Cardiovascular: Normal rate, regular rhythm and normal heart sounds.   No murmur heard. Pulmonary/Chest: Effort normal and breath sounds normal. No respiratory distress. She has no wheezes. She has no rales.  Abdominal: Soft. Bowel sounds are normal. She exhibits no distension. There is no tenderness.  Lymphadenopathy:    She has no cervical adenopathy.  Neurological: She is alert and oriented to person, place, and time. She has normal reflexes.  Skin: Skin is warm and dry. Rash (ntertriginous satellite erythema noted under her breasts bilaterally) noted.  Psychiatric: She has a normal mood and affect. Her behavior is normal.  Unusual affect noted.    No results found for: HGBA1C  Lab Results  Component Value Date   WBC 5.4 07/14/2014   HGB 12.3 07/14/2014   HCT 40.1 07/14/2014   GLUCOSE 88 10/25/2014   ALT 7 10/25/2014   AST 21 10/25/2014   NA 140 10/25/2014   K 4.6 10/25/2014   CL 99 10/25/2014   CREATININE 0.69 10/25/2014   BUN 24 10/25/2014   CO2 21 10/25/2014   TSH 6.890* 10/25/2014    No results found.  Assessment & Plan:   Jenice was seen today for hypertension and hypothyroidism.  Diagnoses and all orders for this visit:  Essential hypertension  Other specified hypothyroidism  Alzheimer's dementia  Yeast dermatitis  Other orders -      donepezil (ARICEPT) 10 MG tablet; Take 0.5 tablets (5 mg total) by mouth at bedtime. -     metoprolol succinate (TOPROL-XL) 100 MG 24 hr tablet; Take 1 tablet (100 mg total) by mouth daily.   I am having Ms. Dross maintain her aspirin, ketoconazole, potassium chloride SA, ergocalciferol, ALPRAZolam, benazepril-hydrochlorthiazide, furosemide, levothyroxine, donepezil, and metoprolol succinate.  Meds ordered this encounter  Medications  . DISCONTD: metoprolol succinate (TOPROL-XL) 50 MG 24 hr tablet    Sig:   . donepezil (ARICEPT) 10 MG tablet    Sig: Take 0.5 tablets (5 mg total) by mouth at bedtime.    Dispense:  30 tablet    Refill:  5  . metoprolol succinate (TOPROL-XL) 100 MG 24 hr tablet    Sig: Take 1 tablet (100 mg total) by mouth daily.    Dispense:  30 tablet    Refill:  5     Follow-up: Return in about 2 months (around 01/10/2015).  Claretta Fraise, M.D.

## 2015-01-12 ENCOUNTER — Encounter: Payer: Self-pay | Admitting: Family Medicine

## 2015-01-12 ENCOUNTER — Ambulatory Visit (INDEPENDENT_AMBULATORY_CARE_PROVIDER_SITE_OTHER): Payer: Medicare PPO | Admitting: Family Medicine

## 2015-01-12 VITALS — BP 139/67 | HR 67 | Temp 97.0°F | Ht 64.0 in | Wt 165.6 lb

## 2015-01-12 DIAGNOSIS — F0280 Dementia in other diseases classified elsewhere without behavioral disturbance: Secondary | ICD-10-CM | POA: Diagnosis not present

## 2015-01-12 DIAGNOSIS — E038 Other specified hypothyroidism: Secondary | ICD-10-CM

## 2015-01-12 DIAGNOSIS — I10 Essential (primary) hypertension: Secondary | ICD-10-CM | POA: Diagnosis not present

## 2015-01-12 DIAGNOSIS — G309 Alzheimer's disease, unspecified: Secondary | ICD-10-CM

## 2015-01-12 NOTE — Patient Instructions (Signed)
Use colace, a stool softener once or twice every day so stools won't hurt so bad.

## 2015-01-12 NOTE — Progress Notes (Signed)
Subjective:  Patient ID: Tina West, female    DOB: [birth year]  Age: 81 y.o. MRN: [id number]  CC: Hypertension and Hypothyroidism   HPI Tina West presents for  follow-up of hypertension. Patient has no history of headache chest pain or shortness of breath or recent cough. Patient also denies symptoms of TIA such as numbness weakness lateralizing. Patient checks  blood pressure at home and has not had any elevated readings recently. Patient denies side effects from his medication. States taking it regularly.  Patient presents for follow-up on  thyroid. She has a history of hypothyroidism for many years. It has been under active recently. Pt. denies any change in  voice, loss of hair, heat or cold intolerance. Energy level has been adequate to good. She does get tired fast.  She denies constipation and diarrhea. No myxedema. Medication is as noted below. Verified that pt is taking it daily on an empty stomach. Well tolerated. Dosage recently adjusted. See medical history.    History Tina West has a past medical history of HTN (hypertension); Anxiety; Hypothyroidism; S/P TAH (total abdominal hysterectomy); Abdominal cyst; Obesity; and Diastolic CHF, acute ([place]).   She has past surgical history that includes Abdominal hysterectomy; Cholecystectomy; and Hernia repair.   Her family history includes Heart disease in her father.She reports that she has quit smoking. She does not have any smokeless tobacco history on file. Her alcohol and drug histories are not on file.  Current Outpatient Prescriptions on File Prior to Visit  Medication Sig Dispense Refill  . ALPRAZolam (XANAX) 0.25 MG tablet Take 1 tablet (0.25 mg total) by mouth 2 (two) times daily. 60 tablet 2  . aspirin (ASPIR-LOW) 81 MG EC tablet Take 81 mg by mouth daily.      . benazepril-hydrochlorthiazide (LOTENSIN HCT) 20-12.5 MG per tablet Take 1 tablet by mouth 2 (two) times daily. 60 tablet 11  . donepezil (ARICEPT) 10 MG  tablet Take 0.5 tablets (5 mg total) by mouth at bedtime. 30 tablet 5  . furosemide (LASIX) 40 MG tablet TAKE 1 TABLET (40 MG TOTAL) BY MOUTH DAILY. 30 tablet 5  . ketoconazole (NIZORAL) 200 MG tablet Take 100 mg by mouth 2 (two) times daily.      Marland Kitchen levothyroxine (SYNTHROID, LEVOTHROID) 137 MCG tablet Take 1 tablet (137 mcg total) by mouth daily. 30 tablet 5  . metoprolol succinate (TOPROL-XL) 100 MG 24 hr tablet Take 1 tablet (100 mg total) by mouth daily. 30 tablet 5  . potassium chloride SA (K-DUR,KLOR-CON) 20 MEQ tablet Take 20 mEq by mouth daily.       No current facility-administered medications on file prior to visit.    ROS Review of Systems  Constitutional: Negative for fever, chills, diaphoresis, appetite change, fatigue and unexpected weight change.  HENT: Negative for congestion, ear pain, hearing loss, postnasal drip, rhinorrhea, sneezing, sore throat and trouble swallowing.   Eyes: Negative for pain.  Respiratory: Negative for cough, chest tightness and shortness of breath.   Cardiovascular: Negative for chest pain and palpitations.  Gastrointestinal: Positive for constipation. Negative for nausea, vomiting, abdominal pain and diarrhea.  Genitourinary: Negative for dysuria, frequency and menstrual problem.  Musculoskeletal: Negative for joint swelling and arthralgias.  Skin: Negative for rash.  Neurological: Negative for dizziness, weakness, numbness and headaches.  Psychiatric/Behavioral: Negative for suicidal ideas, hallucinations, sleep disturbance, dysphoric mood and agitation. The patient is nervous/anxious.     Objective:  BP 139/67 mmHg  Pulse 67  Temp(Src) 97 F (36.1 C) (Oral)  Ht _0  (1.626 m)  Wt 165 lb 9.6 oz (75.116 kg)  BMI 28.41 kg/m2  BP Readings from Last 3 Encounters:  01/12/15 139/67  11/10/14 134/72  10/25/14 187/101    Wt Readings from Last 3 Encounters:  01/12/15 165 lb 9.6 oz (75.116 kg)  11/10/14 164 lb (74.39 kg)  10/25/14 162 lb 6.4  oz (73.664 kg)     Physical Exam  Constitutional: She is oriented to person, place, and time. She appears well-developed and well-nourished. No distress.  HENT:  Head: Normocephalic and atraumatic.  Right Ear: External ear normal.  Left Ear: External ear normal.  Nose: Nose normal.  Mouth/Throat: Oropharynx is clear and moist.  Eyes: Conjunctivae and EOM are normal. Pupils are equal, round, and reactive to light.  Neck: Normal range of motion. Neck supple. No thyromegaly present.  Cardiovascular: Normal rate, regular rhythm and normal heart sounds.   No murmur heard. Pulmonary/Chest: Effort normal and breath sounds normal. No respiratory distress. She has no wheezes. She has no rales.  Abdominal: Soft. Bowel sounds are normal. She exhibits no distension. There is no tenderness.  Lymphadenopathy:    She has no cervical adenopathy.  Neurological: She is alert and oriented to person, place, and time. She has normal reflexes.  Skin: Skin is warm and dry.  Psychiatric: She has a normal mood and affect. Her speech is tangential. She is slowed. Thought content is delusional. Cognition and memory are impaired.    No results found for: HGBA1C  Lab Results  Component Value Date   WBC 5.4 07/14/2014   HGB 12.3 07/14/2014   HCT 40.1 07/14/2014   GLUCOSE 88 10/25/2014   ALT 7 10/25/2014   AST 21 10/25/2014   NA 140 10/25/2014   K 4.6 10/25/2014   CL 99 10/25/2014   CREATININE 0.69 10/25/2014   BUN 24 10/25/2014   CO2 21 10/25/2014   TSH 6.890* 10/25/2014    No results found.  Assessment & Plan:   Tina West was seen today for hypertension and hypothyroidism.  Diagnoses and all orders for this visit:  Essential hypertension -     TSH -     T4, Free -     CMP14+EGFR  Other specified hypothyroidism -     TSH -     T4, Free -     CMP14+EGFR  Alzheimer's dementia -     TSH -     T4, Free -     CMP14+EGFR   I have discontinued Tina West's ergocalciferol. I am also  having her maintain her aspirin, ketoconazole, potassium chloride SA, ALPRAZolam, benazepril-hydrochlorthiazide, furosemide, levothyroxine, donepezil, and metoprolol succinate.  No orders of the defined types were placed in this encounter.     Follow-up: Return in about 6 months (around 07/13/2015) for hypertension, Hypothyroidism.  Claretta Fraise, M.D.

## 2015-04-18 ENCOUNTER — Ambulatory Visit (INDEPENDENT_AMBULATORY_CARE_PROVIDER_SITE_OTHER): Payer: Medicare PPO | Admitting: Family Medicine

## 2015-04-18 ENCOUNTER — Encounter: Payer: Self-pay | Admitting: Family Medicine

## 2015-04-18 VITALS — Temp 96.8°F | Ht 64.0 in | Wt 164.2 lb

## 2015-04-18 DIAGNOSIS — E038 Other specified hypothyroidism: Secondary | ICD-10-CM | POA: Diagnosis not present

## 2015-04-18 DIAGNOSIS — G309 Alzheimer's disease, unspecified: Secondary | ICD-10-CM | POA: Diagnosis not present

## 2015-04-18 DIAGNOSIS — F0280 Dementia in other diseases classified elsewhere without behavioral disturbance: Secondary | ICD-10-CM | POA: Diagnosis not present

## 2015-04-18 DIAGNOSIS — I10 Essential (primary) hypertension: Secondary | ICD-10-CM | POA: Diagnosis not present

## 2015-04-18 LAB — POCT URINALYSIS DIPSTICK
BILIRUBIN UA: NEGATIVE
GLUCOSE UA: NEGATIVE
Ketones, UA: NEGATIVE
Nitrite, UA: NEGATIVE
PH UA: 6
Protein, UA: NEGATIVE
RBC UA: NEGATIVE
SPEC GRAV UA: 1.015
UROBILINOGEN UA: NEGATIVE

## 2015-04-18 NOTE — Progress Notes (Signed)
Subjective:  Patient ID: Tina West, female    DOB: 1928-11-23  Age: 82 y.o. MRN: [id number]  CC: Hypertension; Dementia; and Hypothyroidism   HPI Tina West presents for  follow-up of hypertension. Patient has no history of headache chest pain or shortness of breath or recent cough. Patient also denies symptoms of TIA such as numbness weakness lateralizing. Patient checks  blood pressure at home and has not had any elevated readings recently. Patient denies side effects from medication. States taking it regularly.  Patient presents for follow-up on  thyroid. She has a history of hypothyroidism for many years. Clinically  has been stable recently. Pt. denies any change in  voice, loss of hair, heat or cold intolerance. Energy level has been adequate to good. She denies constipation and diarrhea. No myxedema. Medication is as noted below. Verified that pt is taking it daily on an empty stomach. Well tolerated. She did have an elevated TSH at her last check. An interim visit was scheduled but patient failed to get TSH checked at that time. She is due for blood work today.   History Tina West has a past medical history of HTN (hypertension); Anxiety; Hypothyroidism; S/P TAH (total abdominal hysterectomy); Abdominal cyst; Obesity; and Diastolic CHF, acute ([place]).   She has past surgical history that includes Abdominal hysterectomy; Cholecystectomy; and Hernia repair.   Her family history includes Heart disease in her father.She reports that she has quit smoking. She does not have any smokeless tobacco history on file. Her alcohol and drug histories are not on file.  Current Outpatient Prescriptions on File Prior to Visit  Medication Sig Dispense Refill  . ALPRAZolam (XANAX) 0.25 MG tablet Take 1 tablet (0.25 mg total) by mouth 2 (two) times daily. 60 tablet 2  . aspirin (ASPIR-LOW) 81 MG EC tablet Take 81 mg by mouth daily.      . benazepril-hydrochlorthiazide (LOTENSIN HCT) 20-12.5 MG  per tablet Take 1 tablet by mouth 2 (two) times daily. 60 tablet 11  . donepezil (ARICEPT) 10 MG tablet Take 0.5 tablets (5 mg total) by mouth at bedtime. 30 tablet 5  . furosemide (LASIX) 40 MG tablet TAKE 1 TABLET (40 MG TOTAL) BY MOUTH DAILY. 30 tablet 5  . ketoconazole (NIZORAL) 200 MG tablet Take 100 mg by mouth 2 (two) times daily.      Marland Kitchen levothyroxine (SYNTHROID, LEVOTHROID) 137 MCG tablet Take 1 tablet (137 mcg total) by mouth daily. 30 tablet 5  . metoprolol succinate (TOPROL-XL) 100 MG 24 hr tablet Take 1 tablet (100 mg total) by mouth daily. 30 tablet 5  . potassium chloride SA (K-DUR,KLOR-CON) 20 MEQ tablet Take 20 mEq by mouth daily. Reported on 04/18/2015     No current facility-administered medications on file prior to visit.    ROS Review of Systems  Objective:  Temp(Src) 96.8 F (36 C) (Oral)  Ht _0  (1.626 m)  Wt 164 lb 3.2 oz (74.481 kg)  BMI 28.17 kg/m2  BP Readings from Last 3 Encounters:  01/12/15 139/67  11/10/14 134/72  10/25/14 187/101    Wt Readings from Last 3 Encounters:  04/18/15 164 lb 3.2 oz (74.481 kg)  01/12/15 165 lb 9.6 oz (75.116 kg)  11/10/14 164 lb (74.39 kg)     Physical Exam   Lab Results  Component Value Date   WBC 5.4 07/14/2014   HGB 12.3 07/14/2014   HCT 40.1 07/14/2014   GLUCOSE 88 10/25/2014   ALT 7 10/25/2014   AST 21 10/25/2014  NA 140 10/25/2014   K 4.6 10/25/2014   CL 99 10/25/2014   CREATININE 0.69 10/25/2014   BUN 24 10/25/2014   CO2 21 10/25/2014   TSH 6.890* 10/25/2014    No results found.  Assessment & Plan:   Hailley was seen today for hypertension, dementia and hypothyroidism.  Diagnoses and all orders for this visit:  Essential hypertension -     CBC with Differential/Platelet -     CMP14+EGFR -     Lipid panel -     TSH + free T4 -     POCT urinalysis dipstick  Other specified hypothyroidism -     CBC with Differential/Platelet -     CMP14+EGFR -     Lipid panel -     TSH + free T4 -      POCT urinalysis dipstick  Alzheimer's dementia   I am having Ms. Clites maintain her aspirin, ketoconazole, potassium chloride SA, ALPRAZolam, benazepril-hydrochlorthiazide, furosemide, levothyroxine, donepezil, and metoprolol succinate.  No orders of the defined types were placed in this encounter.    Clinically patient is stable with all of her issues noted above. Her blood work is pending thyroid to be adjusted based on that. When she returns in 3 months she will need a physical with a [facility] MS he performed  Follow-up: Return in about 3 months (around 07/16/2015) for hypertension, Hypothyroidism.  Claretta Fraise, M.D.

## 2015-04-19 LAB — CMP14+EGFR
ALK PHOS: 91 IU/L (ref 39–117)
ALT: 12 IU/L (ref 0–32)
AST: 21 IU/L (ref 0–40)
Albumin/Globulin Ratio: 1.5 (ref 1.1–2.5)
Albumin: 4 g/dL (ref 3.5–4.7)
BUN/Creatinine Ratio: 40 — ABNORMAL HIGH (ref 11–26)
BUN: 26 mg/dL (ref 8–27)
Bilirubin Total: 0.4 mg/dL (ref 0.0–1.2)
CHLORIDE: 99 mmol/L (ref 96–106)
CO2: 24 mmol/L (ref 18–29)
Calcium: 10.1 mg/dL (ref 8.7–10.3)
Creatinine, Ser: 0.65 mg/dL (ref 0.57–1.00)
GFR calc Af Amer: 94 mL/min/{1.73_m2} (ref >59)
GFR calc non Af Amer: 82 mL/min/{1.73_m2} (ref >59)
GLUCOSE: 101 mg/dL — AB (ref 65–99)
Globulin, Total: 2.7 g/dL (ref 1.5–4.5)
Potassium: 4.1 mmol/L (ref 3.5–5.2)
Sodium: 142 mmol/L (ref 134–144)
Total Protein: 6.7 g/dL (ref 6.0–8.5)

## 2015-04-19 LAB — CBC WITH DIFFERENTIAL/PLATELET
Basophils Absolute: 0 10*3/uL (ref 0.0–0.2)
Basos: 1 %
EOS (ABSOLUTE): 0.1 10*3/uL (ref 0.0–0.4)
Eos: 1 %
HEMOGLOBIN: 12.3 g/dL (ref 11.1–15.9)
Hematocrit: 38.2 % (ref 34.0–46.6)
Immature Grans (Abs): 0 10*3/uL (ref 0.0–0.1)
Immature Granulocytes: 0 %
Lymphocytes Absolute: 1.4 10*3/uL (ref 0.7–3.1)
Lymphs: 24 %
MCH: 28.3 pg (ref 26.6–33.0)
MCHC: 32.2 g/dL (ref 31.5–35.7)
MCV: 88 fL (ref 79–97)
MONOCYTES: 10 %
MONOS ABS: 0.6 10*3/uL (ref 0.1–0.9)
Neutrophils Absolute: 3.9 10*3/uL (ref 1.4–7.0)
Neutrophils: 64 %
PLATELETS: 577 10*3/uL — AB (ref 150–379)
RBC: 4.35 x10E6/uL (ref 3.77–5.28)
RDW: 14.2 % (ref 12.3–15.4)
WBC: 5.9 10*3/uL (ref 3.4–10.8)

## 2015-04-19 LAB — LIPID PANEL
CHOLESTEROL TOTAL: 191 mg/dL (ref 100–199)
Chol/HDL Ratio: 1.9 ratio units (ref 0.0–4.4)
HDL: 101 mg/dL (ref >39)
LDL Calculated: 82 mg/dL (ref 0–99)
TRIGLYCERIDES: 39 mg/dL (ref 0–149)
VLDL CHOLESTEROL CAL: 8 mg/dL (ref 5–40)

## 2015-04-19 LAB — TSH+FREE T4
Free T4: 0.7 ng/dL — ABNORMAL LOW (ref 0.82–1.77)
TSH: 12.37 u[IU]/mL — ABNORMAL HIGH (ref 0.450–4.500)

## 2015-05-02 ENCOUNTER — Encounter: Payer: Self-pay | Admitting: *Deleted

## 2016-07-22 ENCOUNTER — Other Ambulatory Visit: Payer: Self-pay | Admitting: Family Medicine

## 2016-07-25 ENCOUNTER — Encounter: Payer: Self-pay | Admitting: *Deleted

## 2016-08-01 ENCOUNTER — Ambulatory Visit: Payer: Medicare PPO | Admitting: Family

## 2016-08-04 ENCOUNTER — Encounter: Payer: Self-pay | Admitting: Family Medicine

## 2016-08-04 ENCOUNTER — Telehealth: Payer: Self-pay | Admitting: Family Medicine

## 2016-08-08 ENCOUNTER — Telehealth: Payer: Self-pay | Admitting: Pharmacist

## 2016-08-08 NOTE — Telephone Encounter (Signed)
Tried to call to reschedule missed appt with Tina Dun, NP from 08/01/17 - no answer and unable to leave message

## 2016-08-13 NOTE — Telephone Encounter (Signed)
No voice mail available to leave a message.

## 2017-03-02 ENCOUNTER — Other Ambulatory Visit: Payer: Self-pay | Admitting: *Deleted

## 2017-03-02 ENCOUNTER — Encounter: Payer: Self-pay | Admitting: *Deleted

## 2017-03-02 NOTE — Patient Outreach (Signed)
[employer] HRA call attempted for pt that does not seem to have had an AWV this year. Called and got message that the pt is not taking calls at this time. I will continue to out reach.  Eulah Pont. Myrtie Neither, MSN, [facility] Gerontological Nurse Practitioner [facility] Care Management [phone]

## 2017-03-02 NOTE — Patient Outreach (Signed)
I have tried to call all the listed numbers for this pt without success. I am going to send a letter to inform her of our services.  Tina West. Myrtie Neither, MSN, [facility] Gerontological Nurse Practitioner [facility] Care Management [phone]

## 2017-03-12 ENCOUNTER — Emergency Department (HOSPITAL_COMMUNITY): Payer: Medicare PPO

## 2017-03-12 ENCOUNTER — Other Ambulatory Visit: Payer: Self-pay

## 2017-03-12 ENCOUNTER — Encounter (HOSPITAL_COMMUNITY): Payer: Self-pay | Admitting: *Deleted

## 2017-03-12 ENCOUNTER — Ambulatory Visit: Payer: Medicare PPO | Admitting: Family

## 2017-03-12 ENCOUNTER — Inpatient Hospital Stay (HOSPITAL_COMMUNITY)
Admission: EM | Admit: 2017-03-12 | Discharge: 2017-03-27 | DRG: 286 | Disposition: A | Discharge status: Skilled Nursing Facility | Payer: Medicare PPO | Attending: Internal Medicine | Admitting: Internal Medicine

## 2017-03-12 DIAGNOSIS — L03116 Cellulitis of left lower limb: Secondary | ICD-10-CM

## 2017-03-12 DIAGNOSIS — I481 Persistent atrial fibrillation: Secondary | ICD-10-CM | POA: Diagnosis not present

## 2017-03-12 DIAGNOSIS — R0602 Shortness of breath: Secondary | ICD-10-CM | POA: Diagnosis present

## 2017-03-12 DIAGNOSIS — I248 Other forms of acute ischemic heart disease: Secondary | ICD-10-CM | POA: Diagnosis present

## 2017-03-12 DIAGNOSIS — E871 Hypo-osmolality and hyponatremia: Secondary | ICD-10-CM | POA: Diagnosis not present

## 2017-03-12 DIAGNOSIS — D473 Essential (hemorrhagic) thrombocythemia: Secondary | ICD-10-CM

## 2017-03-12 DIAGNOSIS — I10 Essential (primary) hypertension: Secondary | ICD-10-CM | POA: Diagnosis present

## 2017-03-12 DIAGNOSIS — I509 Heart failure, unspecified: Secondary | ICD-10-CM

## 2017-03-12 DIAGNOSIS — I428 Other cardiomyopathies: Secondary | ICD-10-CM | POA: Diagnosis present

## 2017-03-12 DIAGNOSIS — I272 Pulmonary hypertension, unspecified: Secondary | ICD-10-CM | POA: Diagnosis present

## 2017-03-12 DIAGNOSIS — I5021 Acute systolic (congestive) heart failure: Secondary | ICD-10-CM

## 2017-03-12 DIAGNOSIS — I2510 Atherosclerotic heart disease of native coronary artery without angina pectoris: Secondary | ICD-10-CM | POA: Diagnosis present

## 2017-03-12 DIAGNOSIS — E43 Unspecified severe protein-calorie malnutrition: Secondary | ICD-10-CM

## 2017-03-12 DIAGNOSIS — Z23 Encounter for immunization: Secondary | ICD-10-CM

## 2017-03-12 DIAGNOSIS — I4891 Unspecified atrial fibrillation: Secondary | ICD-10-CM | POA: Diagnosis not present

## 2017-03-12 DIAGNOSIS — I700 Atherosclerosis of aorta: Secondary | ICD-10-CM | POA: Diagnosis present

## 2017-03-12 DIAGNOSIS — R748 Abnormal levels of other serum enzymes: Secondary | ICD-10-CM

## 2017-03-12 DIAGNOSIS — Z6822 Body mass index (BMI) 22.0-22.9, adult: Secondary | ICD-10-CM | POA: Diagnosis not present

## 2017-03-12 DIAGNOSIS — F419 Anxiety disorder, unspecified: Secondary | ICD-10-CM | POA: Diagnosis present

## 2017-03-12 DIAGNOSIS — R600 Localized edema: Secondary | ICD-10-CM | POA: Diagnosis not present

## 2017-03-12 DIAGNOSIS — I5033 Acute on chronic diastolic (congestive) heart failure: Secondary | ICD-10-CM

## 2017-03-12 DIAGNOSIS — Z79899 Other long term (current) drug therapy: Secondary | ICD-10-CM

## 2017-03-12 DIAGNOSIS — Z8249 Family history of ischemic heart disease and other diseases of the circulatory system: Secondary | ICD-10-CM

## 2017-03-12 DIAGNOSIS — I472 Ventricular tachycardia: Secondary | ICD-10-CM | POA: Diagnosis not present

## 2017-03-12 DIAGNOSIS — R2243 Localized swelling, mass and lump, lower limb, bilateral: Secondary | ICD-10-CM | POA: Diagnosis not present

## 2017-03-12 DIAGNOSIS — I110 Hypertensive heart disease with heart failure: Secondary | ICD-10-CM | POA: Diagnosis present

## 2017-03-12 DIAGNOSIS — Z7982 Long term (current) use of aspirin: Secondary | ICD-10-CM

## 2017-03-12 DIAGNOSIS — D75839 Thrombocytosis, unspecified: Secondary | ICD-10-CM

## 2017-03-12 DIAGNOSIS — Z91041 Radiographic dye allergy status: Secondary | ICD-10-CM

## 2017-03-12 DIAGNOSIS — Z90710 Acquired absence of both cervix and uterus: Secondary | ICD-10-CM

## 2017-03-12 DIAGNOSIS — I739 Peripheral vascular disease, unspecified: Secondary | ICD-10-CM

## 2017-03-12 DIAGNOSIS — E872 Acidosis: Secondary | ICD-10-CM | POA: Diagnosis present

## 2017-03-12 DIAGNOSIS — I447 Left bundle-branch block, unspecified: Secondary | ICD-10-CM | POA: Diagnosis present

## 2017-03-12 DIAGNOSIS — I70262 Atherosclerosis of native arteries of extremities with gangrene, left leg: Secondary | ICD-10-CM | POA: Diagnosis present

## 2017-03-12 DIAGNOSIS — L97529 Non-pressure chronic ulcer of other part of left foot with unspecified severity: Secondary | ICD-10-CM | POA: Diagnosis present

## 2017-03-12 DIAGNOSIS — D649 Anemia, unspecified: Secondary | ICD-10-CM | POA: Diagnosis present

## 2017-03-12 DIAGNOSIS — R296 Repeated falls: Secondary | ICD-10-CM | POA: Diagnosis present

## 2017-03-12 DIAGNOSIS — Z87891 Personal history of nicotine dependence: Secondary | ICD-10-CM

## 2017-03-12 DIAGNOSIS — E039 Hypothyroidism, unspecified: Secondary | ICD-10-CM

## 2017-03-12 DIAGNOSIS — Z599 Problem related to housing and economic circumstances, unspecified: Secondary | ICD-10-CM

## 2017-03-12 DIAGNOSIS — Z9114 Patient's other noncompliance with medication regimen: Secondary | ICD-10-CM

## 2017-03-12 DIAGNOSIS — I5043 Acute on chronic combined systolic (congestive) and diastolic (congestive) heart failure: Secondary | ICD-10-CM | POA: Diagnosis present

## 2017-03-12 DIAGNOSIS — Z9119 Patient's noncompliance with other medical treatment and regimen: Secondary | ICD-10-CM

## 2017-03-12 DIAGNOSIS — L0390 Cellulitis, unspecified: Secondary | ICD-10-CM | POA: Diagnosis not present

## 2017-03-12 HISTORY — DX: Unspecified atrial fibrillation: I48.91

## 2017-03-12 HISTORY — DX: Peripheral vascular disease, unspecified: I73.9

## 2017-03-12 LAB — BASIC METABOLIC PANEL
Anion gap: 16 — ABNORMAL HIGH (ref 5–15)
BUN: 27 mg/dL — ABNORMAL HIGH (ref 6–20)
CHLORIDE: 98 mmol/L — AB (ref 101–111)
CO2: 21 mmol/L — ABNORMAL LOW (ref 22–32)
Calcium: 10.1 mg/dL (ref 8.9–10.3)
Creatinine, Ser: 1.04 mg/dL — ABNORMAL HIGH (ref 0.44–1.00)
GFR calc non Af Amer: 47 mL/min — ABNORMAL LOW (ref >60)
GFR, EST AFRICAN AMERICAN: 55 mL/min — AB (ref >60)
Glucose, Bld: 141 mg/dL — ABNORMAL HIGH (ref 65–99)
POTASSIUM: 4 mmol/L (ref 3.5–5.1)
SODIUM: 135 mmol/L (ref 135–145)

## 2017-03-12 LAB — I-STAT CG4 LACTIC ACID, ED
Lactic Acid, Venous: 3.19 mmol/L (ref 0.5–1.9)
Lactic Acid, Venous: 2.38 mmol/L (ref 0.5–1.9)

## 2017-03-12 LAB — URINALYSIS, ROUTINE W REFLEX MICROSCOPIC
Bilirubin Urine: NEGATIVE
Glucose, UA: 50 mg/dL — AB
Hgb urine dipstick: NEGATIVE
Ketones, ur: NEGATIVE mg/dL
Leukocytes, UA: NEGATIVE
Nitrite: NEGATIVE
Protein, ur: 100 mg/dL — AB
Specific Gravity, Urine: 1.026 (ref 1.005–1.030)
pH: 5 (ref 5.0–8.0)

## 2017-03-12 LAB — CBC
HEMATOCRIT: 36.6 % (ref 36.0–46.0)
Hemoglobin: 11.5 g/dL — ABNORMAL LOW (ref 12.0–15.0)
MCH: 25.6 pg — ABNORMAL LOW (ref 26.0–34.0)
MCHC: 31.4 g/dL (ref 30.0–36.0)
MCV: 81.3 fL (ref 78.0–100.0)
PLATELETS: 717 10*3/uL — AB (ref 150–400)
RBC: 4.5 MIL/uL (ref 3.87–5.11)
RDW: 15.4 % (ref 11.5–15.5)
WBC: 10.1 10*3/uL (ref 4.0–10.5)

## 2017-03-12 LAB — I-STAT TROPONIN, ED: Troponin i, poc: 0.31 ng/mL (ref 0.00–0.08)

## 2017-03-12 LAB — TROPONIN I
Troponin I: 0.29 ng/mL (ref <0.03)
Troponin I: 0.29 ng/mL (ref <0.03)

## 2017-03-12 LAB — TSH: TSH: 32.904 u[IU]/mL — AB (ref 0.350–4.500)

## 2017-03-12 MED ORDER — ACETAMINOPHEN 325 MG PO TABS
650.0000 mg | ORAL_TABLET | Freq: Four times a day (QID) | ORAL | Status: DC | PRN
  Administered 2017-03-13 – 2017-03-26 (×8): 650 mg via ORAL
  Filled 2017-03-12 (×8): qty 2

## 2017-03-12 MED ORDER — DILTIAZEM HCL-DEXTROSE 100-5 MG/100ML-% IV SOLN (PREMIX)
5.0000 mg/h | Freq: Once | INTRAVENOUS | Status: AC
  Administered 2017-03-12: 5 mg/h via INTRAVENOUS
  Filled 2017-03-12: qty 100

## 2017-03-12 MED ORDER — ACETAMINOPHEN 650 MG RE SUPP
650.0000 mg | Freq: Four times a day (QID) | RECTAL | Status: DC | PRN
  Filled 2017-03-12: qty 1

## 2017-03-12 MED ORDER — DILTIAZEM HCL-DEXTROSE 100-5 MG/100ML-% IV SOLN (PREMIX)
5.0000 mg/h | INTRAVENOUS | Status: DC
Start: 2017-03-12 — End: 2017-03-13
  Administered 2017-03-13: 7.5 mg/h via INTRAVENOUS
  Filled 2017-03-12: qty 100

## 2017-03-12 MED ORDER — CEFAZOLIN SODIUM-DEXTROSE 1-4 GM/50ML-% IV SOLN
1.0000 g | Freq: Once | INTRAVENOUS | Status: AC
  Administered 2017-03-12: 1 g via INTRAVENOUS
  Filled 2017-03-12: qty 50

## 2017-03-12 MED ORDER — HEPARIN (PORCINE) IN NACL 100-0.45 UNIT/ML-% IJ SOLN
1000.0000 [IU]/h | INTRAMUSCULAR | Status: DC
  Administered 2017-03-12 – 2017-03-13 (×2): 1000 [IU]/h via INTRAVENOUS
  Administered 2017-03-15 – 2017-03-16 (×2): 1350 [IU]/h via INTRAVENOUS
  Administered 2017-03-17: 1250 [IU]/h via INTRAVENOUS
  Filled 2017-03-12 (×7): qty 250

## 2017-03-12 MED ORDER — SODIUM CHLORIDE 0.9% FLUSH
3.0000 mL | Freq: Two times a day (BID) | INTRAVENOUS | Status: DC
  Administered 2017-03-13 – 2017-03-26 (×13): 3 mL via INTRAVENOUS

## 2017-03-12 MED ORDER — BARIUM SULFATE 2.1 % PO SUSP
ORAL | Status: AC
  Filled 2017-03-12: qty 2

## 2017-03-12 MED ORDER — FUROSEMIDE 10 MG/ML IJ SOLN
20.0000 mg | Freq: Once | INTRAMUSCULAR | Status: AC
  Administered 2017-03-12: 20 mg via INTRAVENOUS
  Filled 2017-03-12: qty 2

## 2017-03-12 MED ORDER — ASPIRIN 81 MG PO CHEW
81.0000 mg | CHEWABLE_TABLET | Freq: Once | ORAL | Status: AC
  Administered 2017-03-12: 81 mg via ORAL
  Filled 2017-03-12: qty 1

## 2017-03-12 MED ORDER — ONDANSETRON HCL 4 MG PO TABS: 4.0000 mg | ORAL_TABLET | Freq: Four times a day (QID) | ORAL | Status: DC | PRN

## 2017-03-12 MED ORDER — INFLUENZA VAC SPLIT HIGH-DOSE 0.5 ML IM SUSY
0.5000 mL | PREFILLED_SYRINGE | INTRAMUSCULAR | Status: AC
  Administered 2017-03-15: 0.5 mL via INTRAMUSCULAR
  Filled 2017-03-12: qty 0.5

## 2017-03-12 MED ORDER — PNEUMOCOCCAL VAC POLYVALENT 25 MCG/0.5ML IJ INJ
0.5000 mL | INJECTION | INTRAMUSCULAR | Status: AC
  Administered 2017-03-15: 0.5 mL via INTRAMUSCULAR
  Filled 2017-03-12: qty 0.5

## 2017-03-12 MED ORDER — SODIUM CHLORIDE 0.9 % IV SOLN: 250.0000 mL | INTRAVENOUS | Status: DC | PRN

## 2017-03-12 MED ORDER — ONDANSETRON HCL 4 MG/2ML IJ SOLN
4.0000 mg | Freq: Four times a day (QID) | INTRAMUSCULAR | Status: DC | PRN
  Administered 2017-03-21: 4 mg via INTRAVENOUS
  Filled 2017-03-12: qty 2

## 2017-03-12 MED ORDER — HEPARIN BOLUS VIA INFUSION
3700.0000 [IU] | Freq: Once | INTRAVENOUS | Status: AC
  Administered 2017-03-12: 3700 [IU] via INTRAVENOUS
  Filled 2017-03-12: qty 3700

## 2017-03-12 MED ORDER — SODIUM CHLORIDE 0.9% FLUSH: 3.0000 mL | INTRAVENOUS | Status: DC | PRN

## 2017-03-12 MED ORDER — DILTIAZEM HCL ER COATED BEADS 120 MG PO CP24: 120.0000 mg | ORAL_CAPSULE | Freq: Every day | ORAL | Status: DC

## 2017-03-12 NOTE — ED Notes (Signed)
Blood for TSH was attempted to be collected x2 by tech unsuccessfully.

## 2017-03-12 NOTE — ED Notes (Signed)
Pt returns from CT after accompanied by RN

## 2017-03-12 NOTE — ED Notes (Signed)
Called XR pt is there, will transport to E46.

## 2017-03-12 NOTE — Progress Notes (Signed)
ANTICOAGULATION CONSULT NOTE - Initial Consult  Pharmacy Consult for heparin Indication: atrial fibrillation  Allergies  Allergen Reactions  . Codeine Hives  . Contrast Media [Iodinated Diagnostic Agents] Other (See Comments)    unspecified  . Diazepam Hives  . Meperidine Hcl Other (See Comments)    unspecified  . Morphine Other (See Comments)    "caused heart attack"    Patient Measurements: Height: 5\' 9"  (175.3 cm) Weight: 164 lb (74.4 kg) IBW/kg (Calculated) : 66.2 Heparin Dosing Weight: 74.4 kg  Vital Signs: Temp: 98.2 F (36.8 C) (12/27 1330) Temp Source: Rectal (12/27 1330) BP: 130/114 (12/27 1730) Pulse Rate: 76 (12/27 1730)  Labs: Recent Labs    03/12/17 1130 03/12/17 1524  HGB 11.5*  --   HCT 36.6  --   PLT 717*  --   CREATININE 1.04*  --   TROPONINI  --  0.29*    Estimated Creatinine Clearance: 40.6 mL/min (A) (by C-G formula based on SCr of 1.04 mg/dL (H)).   Medical History: Past Medical History:  Diagnosis Date  . A-fib ([place])   . Abdominal cyst    Multiple surgeries in [place]. "Heart Stopped" on the operating table during one of her "cyst" operations, probably 15-20 years ago  . Anxiety   . Diastolic CHF, acute ([facility])    BNP 179 in 1/11. Echo (1/11) with EF 60%, mild focal basal septal hypertrophy, grade I diastolic dysfunction, mild left atrial enlargement.   Marland Kitchen HTN (hypertension)   . Hypothyroidism   . Obesity   . S/P TAH (total abdominal hysterectomy)     Medications:  Scheduled:  . aspirin  81 mg Oral Once  . diltiazem  120 mg Oral Daily  . furosemide  20 mg Intravenous Once  . heparin  3,700 Units Intravenous Once  . sodium chloride flush  3 mL Intravenous Q12H    Assessment: 38 yof found to be in atrial fibrillation with RVR with no known history. Was not on anticoagulation prior to admission. Hgb 11.5, platelets are elevated at 717. No signs/symptoms of bleeding.   Goal of Therapy:  Heparin level 0.3-0.7 units/ml Monitor  platelets by anticoagulation protocol: Yes   Plan:   Give 3700 units bolus x 1 Start heparin infusion at 1000 units/hr Check anti-Xa level in 8 hours and daily while on heparin Continue to monitor H&H and platelets  Doylene Canard, PharmD Clinical Pharmacist  Pager: [phone] Phone: [phone] 03/12/2017,6:33 PM

## 2017-03-12 NOTE — ED Notes (Signed)
In & Out cath performed at 13:30  Both UA and Urine Culture obtained.

## 2017-03-12 NOTE — ED Notes (Signed)
Heart rate has variability from 90-140. Pt does not perfuse all beats. And morphology of beats varies. Discussed diltiazem drip with Hedges PA and he states to leave drip at current rate.

## 2017-03-12 NOTE — ED Notes (Signed)
Call Frankey Poot when patient is discharge [phone] or [phone]

## 2017-03-12 NOTE — ED Notes (Signed)
Pt c/o pain at feet. Right foot cool to touch with swelling noted . DP pulse located with doppler. Left foot warm to touch and swollen with DP palpable.

## 2017-03-12 NOTE — ED Provider Notes (Signed)
[facility] EMERGENCY DEPARTMENT Provider Note   CSN: [id number] Arrival date & time: 03/12/17  1123   History   Chief Complaint Chief Complaint  Patient presents with  . Shortness of Breath  . Abdominal Pain    HPI Tina West is a 83 y.o. female.  HPI   83 year old female with a past medical history of hypertension, anxiety, hypothyroidism, diastolic congestive heart failure, status post total abdominal hysterectomy presents today with shortness of breath. She notes chest pain, central in location with no radiation of symptoms.  Patient has her son at bedside.  He reports that due to financial constraints she has not had her medications in several months.  He notes that over the last month patient has developed bilateral lower extremity edema, shortness of breath, orthopnea, and abdominal pain.  They deny any fevers at home, report 2 weeks of redness to the left lower foot.    Past Medical History:  Diagnosis Date  . A-fib ([place])   . Abdominal cyst    Multiple surgeries in [place]. "Heart Stopped" on the operating table during one of her "cyst" operations, probably 15-20 years ago  . Anxiety   . Diastolic CHF, acute (HCC)    BNP 179 in 1/11. Echo (1/11) with EF 60%, mild focal basal septal hypertrophy, grade I diastolic dysfunction, mild left atrial enlargement.   Marland Kitchen HTN (hypertension)   . Hypothyroidism   . Obesity   . S/P TAH (total abdominal hysterectomy)     Patient Active Problem List   Diagnosis Date Noted  . A-fib ([place]) 03/12/2017  . Pedal edema 03/12/2017  . Left leg cellulitis 03/12/2017  . Hypothyroidism 03/12/2017  . Other specified hypothyroidism 01/12/2015  . Alzheimer's dementia 01/12/2015  . CARDIOMYOPATHY, ALCOHOLIC 82/42/3536  . Essential hypertension 04/05/2009  . PERSONAL HISTORY OF SUDDEN CARDIAC ARREST 04/05/2009    Past Surgical History:  Procedure Laterality Date  . ABDOMINAL HYSTERECTOMY    . CHOLECYSTECTOMY    .  HERNIA REPAIR      OB History    No data available       Home Medications    Prior to Admission medications   Medication Sig Start Date End Date Taking? Authorizing Provider  acetaminophen (TYLENOL) 325 MG tablet Take 650 mg by mouth every 6 (six) hours as needed for mild pain.   Yes [provider]  ALPRAZolam (XANAX) 0.25 MG tablet Take 1 tablet (0.25 mg total) by mouth 2 (two) times daily. Patient taking differently: Take 0.5 mg by mouth daily as needed for anxiety.  07/14/14  Yes Claretta Fraise, MD  aspirin (ASPIR-LOW) 81 MG EC tablet Take 81 mg by mouth daily.     Yes [provider]  DM-Doxylamine-Acetaminophen (NYQUIL COLD & FLU PO) Take 15 mLs by mouth at bedtime as needed (cough).   Yes [provider]  ibuprofen (ADVIL,MOTRIN) 200 MG tablet Take 200 mg by mouth every 6 (six) hours as needed for moderate pain.   Yes [provider]  benazepril-hydrochlorthiazide (LOTENSIN HCT) 20-12.5 MG per tablet Take 1 tablet by mouth 2 (two) times daily. Patient not taking: Reported on 03/12/2017 10/25/14   Claretta Fraise, MD  donepezil (ARICEPT) 10 MG tablet Take 0.5 tablets (5 mg total) by mouth at bedtime. Patient not taking: Reported on 03/12/2017 11/10/14   Claretta Fraise, MD  furosemide (LASIX) 40 MG tablet TAKE 1 TABLET (40 MG TOTAL) BY MOUTH DAILY. Patient not taking: Reported on 03/12/2017 10/25/14   Claretta Fraise, MD  levothyroxine (SYNTHROID, LEVOTHROID) 137 MCG tablet Take 1 tablet (137 mcg total) by mouth daily. Patient not taking: Reported on 03/12/2017 10/26/14   Claretta Fraise, MD  metoprolol succinate (TOPROL-XL) 100 MG 24 hr tablet Take 1 tablet (100 mg total) by mouth daily. Patient not taking: Reported on 03/12/2017 11/10/14   Claretta Fraise, MD    Family History Family History  Problem Relation Age of Onset  . Heart disease Father        Died of "heart attack" at age 81  . CAD Son        Stents.  Currently age 26  . Brain cancer  Mother   . Hypertension Brother   . Brain cancer Brother   . Ulcers Son        bleeding ulcers  . Alzheimer's disease Brother        presumed to have died of heart attack  . Stomach cancer Daughter        died in her 49's    Social History Social History   Tobacco Use  . Smoking status: Former Research scientist (life sciences)  . Smokeless tobacco: Never Used  . Tobacco comment: quit smoking at age 77  Substance Use Topics  . Alcohol use: No    Frequency: Never  . Drug use: No     Allergies   Codeine; Contrast media [iodinated diagnostic agents]; Diazepam; Meperidine hcl; and Morphine   Review of Systems Review of Systems  All other systems reviewed and are negative.  Physical Exam Updated Vital Signs BP (!) 130/114 (BP Location: Right Arm)   Pulse 76   Temp (S) 98.2 F (36.8 C) (Rectal)   Resp 17   Ht 5\' 9"  (1.753 m)   Wt 74.4 kg (164 lb)   SpO2 99%   BMI 24.22 kg/m   Physical Exam  Constitutional: She is oriented to person, place, and time. She appears well-developed and well-nourished.  HENT:  Head: Normocephalic and atraumatic.  Eyes: Conjunctivae are normal. Pupils are equal, round, and reactive to light. Right eye exhibits no discharge. Left eye exhibits no discharge. No scleral icterus.  Neck: Normal range of motion. No JVD present. No tracheal deviation present.  Pulmonary/Chest: Effort normal. No stridor.  Musculoskeletal:  Bilateral lower extremities with pitting edema; slihglty cool to touch- delayed cap refill- no palpable distal pulses, doppler pulse bilateral posterior tibial, no doppler signal at dorsalis pedis. Left foot with tissue breakdown over great toe with surrounding redness   Neurological: She is alert and oriented to person, place, and time. Coordination normal.  Psychiatric: She has a normal mood and affect. Her behavior is normal. Judgment and thought content normal.  Nursing note and vitals reviewed.    ED Treatments / Results  Labs (all labs ordered  are listed, but only abnormal results are displayed) Labs Reviewed  BASIC METABOLIC PANEL - Abnormal; Notable for the following components:      Result Value   Chloride 98 (*)    CO2 21 (*)    Glucose, Bld 141 (*)    BUN 27 (*)    Creatinine, Ser 1.04 (*)    GFR calc non Af Amer 47 (*)    GFR calc Af Amer 55 (*)    Anion gap 16 (*)    All other components within normal limits  CBC - Abnormal; Notable for the following components:   Hemoglobin 11.5 (*)    MCH 25.6 (*)    Platelets 717 (*)    All other components  within normal limits  URINALYSIS, ROUTINE W REFLEX MICROSCOPIC - Abnormal; Notable for the following components:   Color, Urine AMBER (*)    Glucose, UA 50 (*)    Protein, ur 100 (*)    Bacteria, UA RARE (*)    Squamous Epithelial / LPF 0-5 (*)    All other components within normal limits  TROPONIN I - Abnormal; Notable for the following components:   Troponin I 0.29 (*)    All other components within normal limits  TSH - Abnormal; Notable for the following components:   TSH 32.904 (*)    All other components within normal limits  I-STAT TROPONIN, ED - Abnormal; Notable for the following components:   Troponin i, poc 0.31 (*)    All other components within normal limits  I-STAT CG4 LACTIC ACID, ED - Abnormal; Notable for the following components:   Lactic Acid, Venous 3.19 (*)    All other components within normal limits  I-STAT CG4 LACTIC ACID, ED - Abnormal; Notable for the following components:   Lactic Acid, Venous 2.38 (*)    All other components within normal limits  CULTURE, BLOOD (ROUTINE X 2)  CULTURE, BLOOD (ROUTINE X 2)  URINE CULTURE  BRAIN NATRIURETIC PEPTIDE  HEMOGLOBIN O7F  BASIC METABOLIC PANEL  CBC  TROPONIN I  TROPONIN I  TROPONIN I    EKG  EKG Interpretation  Date/Time:  Thursday March 12 2017 11:30:50 EST Ventricular Rate:  132 PR Interval:    QRS Duration: 154 QT Interval:  368 QTC Calculation: 545 R Axis:   -59 Text  Interpretation:  Atrial fibrillation with rapid ventricular response Left axis deviation Left bundle branch block Abnormal ECG Confirmed by Julianne Rice [phone]) on 03/12/2017 12:40:55 PM       Radiology Ct Abdomen Pelvis Wo Contrast  Result Date: 03/12/2017 CLINICAL DATA:  Shortness of breath for several weeks. Upper abdominal pain. EXAM: CT ABDOMEN AND PELVIS WITHOUT CONTRAST TECHNIQUE: Multidetector CT imaging of the abdomen and pelvis was performed following the standard protocol without IV contrast. COMPARISON:  None. FINDINGS: Motion degraded study. Lower chest: Small to moderate bilateral pleural effusions with bibasilar collapse/ consolidation. The heart is enlarged. Coronary artery calcification is evident. Ground-glass attenuation in the lower lungs suggests edema. Hepatobiliary: No focal abnormality in the liver on this study without intravenous contrast. Gallbladder surgically absent. No intrahepatic or extrahepatic biliary dilation. Pancreas: No focal mass lesion. No dilatation of the main duct. No intraparenchymal cyst. No peripancreatic edema. Spleen: No splenomegaly. No focal mass lesion. Adrenals/Urinary Tract: No adrenal nodule or mass. Kidneys are unremarkable. No evidence for hydroureter. Bladder decompressed. Stomach/Bowel: Stomach is nondistended. No gastric wall thickening. No evidence of outlet obstruction. Distal stomach and proximal duodenum not well characterized due to the motion artifact. No small bowel wall thickening. No small bowel dilatation. Terminal ileum not well seen. The appendix is not visualized, but there is no edema or inflammation in the region of the cecum. Diverticular changes are noted in the left colon without evidence of diverticulitis. Vascular/Lymphatic: There is abdominal aortic atherosclerosis without aneurysm. There is no gastrohepatic or hepatoduodenal ligament lymphadenopathy. No intraperitoneal or retroperitoneal lymphadenopathy. No pelvic sidewall  lymphadenopathy. Reproductive: Uterus surgically absent.  There is no adnexal mass. Other: Apparent edema/ fluid in the retroperitoneum of the right abdomen, tracking in the anterior pararenal space inferior to the duodenum and down along the right psoas muscle. Musculoskeletal: Diffuse body wall edema bones are diffusely demineralized age indeterminate L2 compression fracture noted. IMPRESSION:  1. Study is limited by lack of intravenous contrast and patient motion. There appears to be a small amount of retroperitoneal edema inferior to the duodenum and pancreatic head. Duodenitis/peptic ulcer disease could cause this appearance. Groove pancreatitis would be another consideration. Gallbladder is surgically absent. 2. Diffuse body wall edema. 3. Small the moderate bilateral pleural effusions with diffuse ground-glass attenuation in the lower lungs suggesting edema. 4.  Aortic Atherosclerois (ICD10-170.0) 5. Colonic diverticulosis without definite findings of diverticulitis. Electronically Signed   By: Misty Stanley M.D.   On: 03/12/2017 14:17   Dg Chest 2 View  Result Date: 03/12/2017 CLINICAL DATA:  83 year old female with a history of shortness of breath an weakness EXAM: CHEST  2 VIEW COMPARISON:  None. FINDINGS: Cardiomediastinal silhouette enlarged. Interlobular septal thickening. Low lung volumes with apical kyphotic positioning secondary to kyphotic curvature of the spine. Lateral view demonstrates small pleural effusions. IMPRESSION: Small pleural effusions with evidence of mild edema. Electronically Signed   By: Corrie Mckusick D.O.   On: 03/12/2017 12:32   Dg Foot Complete Left  Result Date: 03/12/2017 CLINICAL DATA:  Left foot necrosis EXAM: LEFT FOOT - COMPLETE 3+ VIEW COMPARISON:  None. FINDINGS: Diffuse osteopenia limits the exam. Mild degenerative changes at the DIP and PIP joints. Mild degenerative change at the first MTP joint. No obvious fracture. Vascular calcifications. Heterogeneous  lucency in the calcaneus bone, possibly due to osteopenia. Dorsal soft tissue swelling. No soft tissue gas. IMPRESSION: Diffuse osteopenia limits the examination. No definite acute osseous abnormality is seen. Heterogeneous lucencies in the calcaneus and metatarsals could be secondary to demineralization Electronically Signed   By: Donavan Foil M.D.   On: 03/12/2017 15:14    Procedures Procedures (including critical care time)  [id number]-[id number] Score for Atrial Fibrillation    Patient Score  Age <65 = 0 65-74 = 1 > 75 = 2 2  Sex Female = 0 Female = 1 1  CHF History No = 0  Yes = 1 yes  HTN History No = 0  Yes = 1 1  Stroke/TIA/TE History No = 0  Yes = 1 0  Vascular Disease History No = 0  Yes = 1 1  Diabetes History No = 0  Yes = 1 0  Total:  5   7.2 % stroke rate/year from a score of 5              CRITICAL CARE Performed by: Stevie Kern Zareena Willis   Total critical care time: 35 minutes  Critical care time was exclusive of separately billable procedures and treating other patients.  Critical care was necessary to treat or prevent imminent or life-threatening deterioration.  Critical care was time spent personally by me on the following activities: development of treatment plan with patient and/or surrogate as well as nursing, discussions with consultants, evaluation of patient's response to treatment, examination of patient, obtaining history from patient or surrogate, ordering and performing treatments and interventions, ordering and review of laboratory studies, ordering and review of radiographic studies, pulse oximetry and re-evaluation of patient's condition.  Medications Ordered in ED Medications  aspirin chewable tablet 81 mg (not administered)  furosemide (LASIX) injection 20 mg (not administered)  heparin ADULT infusion 100 units/mL (25000 units/213mL sodium chloride 0.45%) (not administered)  heparin bolus via infusion 3,700 Units (not administered)  sodium  chloride flush (NS) 0.9 % injection 3 mL (not administered)  sodium chloride flush (NS) 0.9 % injection 3 mL (not administered)  0.9 %  sodium chloride  infusion (not administered)  acetaminophen (TYLENOL) tablet 650 mg (not administered)    Or  acetaminophen (TYLENOL) suppository 650 mg (not administered)  ondansetron (ZOFRAN) tablet 4 mg (not administered)    Or  ondansetron (ZOFRAN) injection 4 mg (not administered)  diltiazem (CARDIZEM CD) 24 hr capsule 120 mg (not administered)  diltiazem (CARDIZEM) 100 mg in dextrose 5% 132mL (1 mg/mL) infusion (not administered)  diltiazem (CARDIZEM) 100 mg in dextrose 5% 183mL (1 mg/mL) infusion (5 mg/hr Intravenous New Bag/Given 03/12/17 1332)  ceFAZolin (ANCEF) IVPB 1 g/50 mL premix (0 g Intravenous Stopped 03/12/17 1616)     Initial Impression / Assessment and Plan / ED Course  I have reviewed the triage vital signs and the nursing notes.  Pertinent labs & imaging results that were available during my care of the patient were reviewed by me and considered in my medical decision making (see chart for details).     Final Clinical Impressions(s) / ED Diagnoses   Final diagnoses:  Atrial fibrillation with RVR (HCC)  Cellulitis of left lower extremity  Acute on chronic diastolic (congestive) heart failure (HCC)    Labs:  I stat lactic acid, I stat trop, bmp, cbc  Imaging: DG Chest   Consults: Cardiology, [facility]   Therapeutics: diltiazem,   Discharge Meds:   Assessment/Plan: 83 year old female presents today with multiple issues.  Patient noncompliant has not been taking medication for last 2 months.  She appears to be fluid overloaded with acute on chronic heart failure, new onset A. fib with RVR, and also septic secondary to cellulitic foot.  Patient started on diltiazem drip here.  Cardiology consulted for evaluation, hospitalist will be consulted for admission for ongoing management.   ED Discharge Orders    None       Francee Gentile 03/12/17 1818    Julianne Rice, MD 03/14/17 [phone]

## 2017-03-12 NOTE — ED Triage Notes (Signed)
Pt states increasing sob over several weeks.  Now states she is unable to lay flat d/t sob.  Also c/o bil LE edema that "goes up into my stomach and hurts and goes up into my chest and sets my heart a-flutterin" .  States has not been taking bp and lasix b/c she has not had the money to fill her prescriptions.

## 2017-03-12 NOTE — ED Notes (Signed)
Writer notified PA [place] of abnormal I stat lactic result 2.38

## 2017-03-12 NOTE — Progress Notes (Signed)
Cardiology Consultation:   Patient ID: Tina West; [id number]; 01-12-1929   Admit date: 03/12/2017 Date of Consult: 03/12/2017  Primary Care Provider: Claretta Fraise, West Primary Cardiologist: New- Tina West  Patient Profile:   Tina West is a 83 y.o. female with a hx of HTN, anxiety, hypothyroidism, diastolic CHF who is being seen today for the evaluation of CHF and atrial fibrillation at the request of Tina West.  History of Present Illness:   Tina West presented to the [facility] ED this am for Complaints of increasing bilateral lower extremity edema, shortness of breath and orthopnea over the past month. She is noted to not have been taking her lasix and BP meds due to lack of money. Her son says that she has hade no meds except for TUMS and occ nerve pill in well over a year.   The patient is alert and oriented but rambling about things that have happened in the distant past. It is difficult to get her recent symptoms. She lives in a trailer with her son and he provides most of the information. He says that he noted specifically that she developed symptoms on December 14th including shortness of breath, dry cough, sneezing, orthopnea and wheezing at night. The patient notes that she has been unable to lay down to sleep, having to sit up on the side of the bed and this leaves her exposed to the cold. She has had fluttering in her chest as well, but I can't get the specifics.   The patient mostly walks around the house. When she walks outside or goes tot he store she gets short of breath. She denies chest pain and her sons says that she has not complained of chest pain.   Her father died of "heart disease" at age 49 but she does not know any details. Her son has had stents placed and just some sort of other procedure yesterday. Her other son has had bleeding ulcers and has a pinched nerve is his neck that causes him to pass out at times. His mother watches him for that. Her  daughter dies of stomach cancer in the 74s'. Her mother also died of a brain tumor.   The patient has been seen by Tina West in the past, last time in 2011. He noted that she had reported that her heart stopped many years ago, well over 20 years, during a surgery. She was told to take it easy and eat better. Tina West was unable to find find records for this, from [place]. He ordered a Lexiscan myoview at the time to assess her complaints of exertional chest tightness and recent symptoms of diastolic CHF. This was presumed to be normal as no cardiac cath was done. Her echo showed normal systolic function.   She has vague abdominal pain and has had nausea for a week or more.    Significant findings: -EKG shows afib with RVR, LAD and LBBB that was not present in 2011.  -First Troponin  0.31 -CXR small pleural effusions with evidence of mild edema.  -Lactic acid 3.19 -K+ 4.0, Scr 1.04 -Hgb 11.5, WBC 10.0 -LDL 82 on 04/18/2015 -CT abdomen shows evidence of coronary artery calcification, suggestion of pulmonary edema, small amt of retroperitoneal edema could indicate duodenitis/peptic ulcer disease. Groove pancreatitis could be another consideration.  -Blood cultures collected  Past Medical History:  Diagnosis Date  . A-fib ([place])   . Abdominal cyst    Multiple surgeries in [place]. "Heart Stopped" on the operating table during  one of her "cyst" operations, probably 15-20 years ago  . Anxiety   . Diastolic CHF, acute ([facility])    BNP 179 in 1/11. Echo (1/11) with EF 60%, mild focal basal septal hypertrophy, grade I diastolic dysfunction, mild left atrial enlargement.   Marland Kitchen HTN (hypertension)   . Hypothyroidism   . Obesity   . S/P TAH (total abdominal hysterectomy)     Past Surgical History:  Procedure Laterality Date  . ABDOMINAL HYSTERECTOMY    . CHOLECYSTECTOMY    . HERNIA REPAIR       Home Medications:  Prior to Admission medications   Medication Sig Start Date End Date Taking?  Authorizing Provider  acetaminophen (TYLENOL) 325 MG tablet Take 650 mg by mouth every 6 (six) hours as needed for mild pain.   Yes Provider, Historical, West  ALPRAZolam (XANAX) 0.25 MG tablet Take 1 tablet (0.25 mg total) by mouth 2 (two) times daily. Patient taking differently: Take 0.5 mg by mouth daily as needed for anxiety.  07/14/14  Yes Tina West  aspirin (ASPIR-LOW) 81 MG EC tablet Take 81 mg by mouth daily.     Yes Provider, Historical, West  DM-Doxylamine-Acetaminophen (NYQUIL COLD & FLU PO) Take 15 mLs by mouth at bedtime as needed (cough).   Yes Provider, Historical, West  ibuprofen (ADVIL,MOTRIN) 200 MG tablet Take 200 mg by mouth every 6 (six) hours as needed for moderate pain.   Yes Provider, Historical, West  benazepril-hydrochlorthiazide (LOTENSIN HCT) 20-12.5 MG per tablet Take 1 tablet by mouth 2 (two) times daily. Patient not taking: Reported on 03/12/2017 10/25/14   Tina West  donepezil (ARICEPT) 10 MG tablet Take 0.5 tablets (5 mg total) by mouth at bedtime. Patient not taking: Reported on 03/12/2017 11/10/14   Tina West  furosemide (LASIX) 40 MG tablet TAKE 1 TABLET (40 MG TOTAL) BY MOUTH DAILY. Patient not taking: Reported on 03/12/2017 10/25/14   Tina West  levothyroxine (SYNTHROID, LEVOTHROID) 137 MCG tablet Take 1 tablet (137 mcg total) by mouth daily. Patient not taking: Reported on 03/12/2017 10/26/14   Tina West  metoprolol succinate (TOPROL-XL) 100 MG 24 hr tablet Take 1 tablet (100 mg total) by mouth daily. Patient not taking: Reported on 03/12/2017 11/10/14   Tina West    Inpatient Medications: Scheduled Meds:  Continuous Infusions:  PRN Meds:   Allergies:    Allergies  Allergen Reactions  . Codeine Hives  . Contrast Media [Iodinated Diagnostic Agents] Other (See Comments)    unspecified  . Diazepam Hives  . Meperidine Hcl Other (See Comments)    unspecified  . Morphine Other (See Comments)    "caused heart  attack"    Social History:   Social History   Socioeconomic History  . Marital status: Widowed    Spouse name: Not on file  . Number of children: 1  . Years of education: Not on file  . Highest education level: Not on file  Social Needs  . Financial resource strain: Not on file  . Food insecurity - worry: Not on file  . Food insecurity - inability: Not on file  . Transportation needs - medical: Not on file  . Transportation needs - non-medical: Not on file  Occupational History  . Not on file  Tobacco Use  . Smoking status: Former Research scientist (life sciences)  . Smokeless tobacco: Never Used  . Tobacco comment: quit smoking at age 59  Substance and Sexual Activity  . Alcohol use: No  Frequency: Never  . Drug use: No  . Sexual activity: Not on file  Other Topics Concern  . Not on file  Social History Narrative   Originally from [place], now lives in [place]. She lives with her son. Her grandson and his wife live nearby.    Family History:    Family History  Problem Relation Age of Onset  . Heart disease Father        Died of "heart attack" at age 23  . CAD Son        Stents.  Currently age 86  . Brain cancer Mother   . Hypertension Brother   . Brain cancer Brother   . Ulcers Son        bleeding ulcers  . Alzheimer's disease Brother        presumed to have died of heart attack  . Stomach cancer Daughter        died in her 83's     ROS:  Please see the history of present illness.  ROS  All other ROS reviewed and negative.     Physical Exam/Data:   Vitals:   03/12/17 1530 03/12/17 1545 03/12/17 1600 03/12/17 1615  BP: 120/87 (!) 120/106 (!) 119/106 (!) 123/92  Pulse: 92 70 (!) 57 (!) 52  Resp: (!) 21 20 19  (!) 21  Temp:      TempSrc:      SpO2: 95% 100% 94% 100%  Weight:      Height:       No intake or output data in the 24 hours ending 03/12/17 1645 Filed Weights   03/12/17 1141  Weight: 164 lb (74.4 kg)   Body mass index is 24.22 kg/m.  General:  Slightly  disheveled elderly female, in no acute distress HEENT: normal Lymph: no adenopathy Neck: 1 cm JVD Vascular: No carotid bruits; FA pulses 2+ bilaterally without bruits  Cardiac:  normal S1, S2; irregularly irregular rhythm; no murmur  Lungs:  clear to auscultation bilaterally, no wheezing, rhonchi or rales  Abd: soft, nontender, no hepatomegaly  Ext: 1-2+ peripheral edema up to knees Musculoskeletal:  No deformities, BUE and BLE strength normal and equal Skin: warm and dry  Neuro:  CNs 2-12 intact, no focal abnormalities noted Psych:  Normal affect   EKG:  The EKG was personally reviewed and demonstrates:   afib with RVR, 132 bpm, LAD and LBBB that was not present in 2011.   Telemetry:  Telemetry was personally reviewed and demonstrates:  afib in the low 100's Relevant CV Studies:  Echocardiogram 04/17/2009 EF 60%, normal wall motion, grade 1 DD, mildly dilated LA, mild focal basal hypertrophy of the septum  Laboratory Data:  Chemistry Recent Labs  Lab 03/12/17 1130  NA 135  K 4.0  CL 98*  CO2 21*  GLUCOSE 141*  BUN 27*  CREATININE 1.04*  CALCIUM 10.1  GFRNONAA 47*  GFRAA 55*  ANIONGAP 16*    No results for input(s): PROT, ALBUMIN, AST, ALT, ALKPHOS, BILITOT in the last 168 hours. Hematology Recent Labs  Lab 03/12/17 1130  WBC 10.1  RBC 4.50  HGB 11.5*  HCT 36.6  MCV 81.3  MCH 25.6*  MCHC 31.4  RDW 15.4  PLT 717*   Cardiac Enzymes Recent Labs  Lab 03/12/17 1524  TROPONINI 0.29*    Recent Labs  Lab 03/12/17 1148  TROPIPOC 0.31*    BNPNo results for input(s): BNP, PROBNP in the last 168 hours.  DDimer No results for input(s): DDIMER  in the last 168 hours.  Radiology/Studies:  Ct Abdomen Pelvis Wo Contrast  Result Date: 03/12/2017 CLINICAL DATA:  Shortness of breath for several weeks. Upper abdominal pain. EXAM: CT ABDOMEN AND PELVIS WITHOUT CONTRAST TECHNIQUE: Multidetector CT imaging of the abdomen and pelvis was performed following the standard  protocol without IV contrast. COMPARISON:  None. FINDINGS: Motion degraded study. Lower chest: Small to moderate bilateral pleural effusions with bibasilar collapse/ consolidation. The heart is enlarged. Coronary artery calcification is evident. Ground-glass attenuation in the lower lungs suggests edema. Hepatobiliary: No focal abnormality in the liver on this study without intravenous contrast. Gallbladder surgically absent. No intrahepatic or extrahepatic biliary dilation. Pancreas: No focal mass lesion. No dilatation of the main duct. No intraparenchymal cyst. No peripancreatic edema. Spleen: No splenomegaly. No focal mass lesion. Adrenals/Urinary Tract: No adrenal nodule or mass. Kidneys are unremarkable. No evidence for hydroureter. Bladder decompressed. Stomach/Bowel: Stomach is nondistended. No gastric wall thickening. No evidence of outlet obstruction. Distal stomach and proximal duodenum not well characterized due to the motion artifact. No small bowel wall thickening. No small bowel dilatation. Terminal ileum not well seen. The appendix is not visualized, but there is no edema or inflammation in the region of the cecum. Diverticular changes are noted in the left colon without evidence of diverticulitis. Vascular/Lymphatic: There is abdominal aortic atherosclerosis without aneurysm. There is no gastrohepatic or hepatoduodenal ligament lymphadenopathy. No intraperitoneal or retroperitoneal lymphadenopathy. No pelvic sidewall lymphadenopathy. Reproductive: Uterus surgically absent.  There is no adnexal mass. Other: Apparent edema/ fluid in the retroperitoneum of the right abdomen, tracking in the anterior pararenal space inferior to the duodenum and down along the right psoas muscle. Musculoskeletal: Diffuse body wall edema bones are diffusely demineralized age indeterminate L2 compression fracture noted. IMPRESSION: 1. Study is limited by lack of intravenous contrast and patient motion. There appears to be a  small amount of retroperitoneal edema inferior to the duodenum and pancreatic head. Duodenitis/peptic ulcer disease could cause this appearance. Groove pancreatitis would be another consideration. Gallbladder is surgically absent. 2. Diffuse body wall edema. 3. Small the moderate bilateral pleural effusions with diffuse ground-glass attenuation in the lower lungs suggesting edema. 4.  Aortic Atherosclerois (ICD10-170.0) 5. Colonic diverticulosis without definite findings of diverticulitis. Electronically Signed   By: Misty Stanley M.D.   On: 03/12/2017 14:17   Dg Chest 2 View  Result Date: 03/12/2017 CLINICAL DATA:  83 year old female with a history of shortness of breath an weakness EXAM: CHEST  2 VIEW COMPARISON:  None. FINDINGS: Cardiomediastinal silhouette enlarged. Interlobular septal thickening. Low lung volumes with apical kyphotic positioning secondary to kyphotic curvature of the spine. Lateral view demonstrates small pleural effusions. IMPRESSION: Small pleural effusions with evidence of mild edema. Electronically Signed   By: Corrie Mckusick D.O.   On: 03/12/2017 12:32   Dg Foot Complete Left  Result Date: 03/12/2017 CLINICAL DATA:  Left foot necrosis EXAM: LEFT FOOT - COMPLETE 3+ VIEW COMPARISON:  None. FINDINGS: Diffuse osteopenia limits the exam. Mild degenerative changes at the DIP and PIP joints. Mild degenerative change at the first MTP joint. No obvious fracture. Vascular calcifications. Heterogeneous lucency in the calcaneus bone, possibly due to osteopenia. Dorsal soft tissue swelling. No soft tissue gas. IMPRESSION: Diffuse osteopenia limits the examination. No definite acute osseous abnormality is seen. Heterogeneous lucencies in the calcaneus and metatarsals could be secondary to demineralization Electronically Signed   By: Donavan Foil M.D.   On: 03/12/2017 15:14    Assessment and Plan:   1.  Atrial fibrillation with RVR -No known hx of afib -possibly related to acute illness  as lactic acid is elevated. BC in process. Has ulcerations on her left first 2 toes -Pt with Shortness of breath and heart fluttering for about 2 weeks. Difficult to tell if has been in afib for that length of time.  -EKG shows new LBBB since EKG in 2011, may be rate related. Will need to reassess once rate is controlled.  -currently with rate improved to the low 100's on IV cardizem. BP is stable.  -CHA2DS2/VAS Stroke Risk score is at least 5  (CHF, HTN, Age (2), female). She has no known bleeding risk but is being evaluated for abdominal pain. Once evaluation done and safe, would start anticoagulation for stroke risk reduction.  -Troponin mildly elevated, trend troponins. No chest pain. Possible related to afib with RVR and demand ischemia.     2. Acute on chronic diastolic CHF -Last echo was in 2011 showed normal LV systolic function with EF 60% and grade 1 dd. Last seen by Tina West in 2011 and was noted to have extra volume at the time and lasix added.  -Pt with 1 month of progressively worsening lower extremity edema, DOE and orthopnea. Not taking any meds in at least a year per the son. Was prescribed benazapril-HCTZ 20-12.5 mg BID, Lasix 40 mg daily, Toprol 100 mg daily -Check BNP -CXR and CT shows mild pulmonary edema. Pt with lower extremity edema up to the knees.  -Check echo once heart rate better controlled.   3. Hypothyroidism -Not taking her levothyroxine. Will check TSH.  4. Possible infection -Elevated lactic acid. WBCs normal. BC done. -Pt with complaints of abdominal pain and nausea -Has ulcerations on left foot.  -Check Hgb A1c for risk stratification and possibility of diabetes related complications.    For questions or updates, please contact [place] Please consult [email or web address] for contact info under Cardiology/STEMI.   Signed, Daune Perch, NP  03/12/2017 4:45 PM   History and all data above reviewed.  Patient examined.  I agree with the findings as  above.  The patient presents with SOB.  Her case seems very sad.  She has been seen in the distant past with diastolic HF but not seen in years because of finances.  She lives in a mobile home with her son.  She can get a ride to the grocery store and shop but she moves slowly.  She does not report severe SOB until about two weeks ago.  There apparently was an abrupt worsening of her SOB.  She cannot describe this.  Her son gives more (but still vague) details.  She is in atrial fib but it is not clear whether she notices this and therefore it is not clear how new this is.  She does not report chest pain.  She reports being cold all of the time but there is a problem with how her home is heated.  Also she has untreated hypothyroidism.  Also of note her feet are cool and there is an ulcer on the left dorsum of the foot and some discoloration on the toes of the right foot.   She does have mild troponin elevation and LBBB on EKG which was not present on old EKGs.  She has an elevated lactic acid.  There is some edema on CXR.  The patient exam reveals [id number]  ,  Lungs: Decreased breath sounds with crackles,  Neck Trace JVD  ,  Abd: Positive bowel  sounds, no rebound no guarding, Ext DP/PT are not palpable but per the ED they were able to Doppler pulses on the right foot but not the left.  Right is cooler than the left but this apparently has improved since she was admitted.    .  All available labs, radiology testing, previous records reviewed. Agree with documented assessment and plan. Atrial fib:  Not clear when the onset was.  She will need anticoagulation and I would start with Heparin.  Rate is currently controlled with IV Dilt and this can continue tonight while we gather information.  Not sure if this precipitated how she feels or how long this has been ongoing.  Elevated troponin:  Could be the tail end of an MI two weeks ago or demand ischemia.  Less likely but possible that it is ongoing ischemia.  Cycle  enzymes, start ASA, check an echo.  Acute SOB:  I suspect some HF and would check echo and BNP.  I will give one dose of IV Lasix in the ED.  PVD:  I would start heparin and get vascular surgery involved to look at her feet.    Jeneen Rinks Sparsh Callens  4:48 PM  03/12/2017

## 2017-03-12 NOTE — H&P (Signed)
History and Physical    Tina West NTI:[id number] DOB: 06-Sep-1928 DOA: 03/12/2017    PCP: Claretta Fraise, MD  Patient coming from: home  Chief Complaint: shortness of breath on exertion  HPI: Tina West is a 83 y.o. female with medical history of A-fib, dCHF, hypothyroidism and anxiety who comes in for symptoms that have been going on for about 2 wks (started Dec 14th).  She mainly complains of shortness of breath with laying flat and with exertion. No chest pain and no palpitations. No cough or wheezing.  She is noted to have significant pedal edema and has 2 shallow ulcers on her left foot. She states that she has blisters which burst open and resulted in the ulcers. Her feet are sore from the swelling.  She further states that has not been able to take her medications for many months now. They do not have a car to get to the pharmacy and have not had a way to pay for medications.   ED Course:  noted to have A-fib with RVR- started on Cardizem infusion and cardiology eval requested - mild troponin elevation - Lactic acid is elevated - TSH is 32  Review of Systems:  Admits to weight loss- cannot quantify - admits to poor urine output  All other systems reviewed and apart from HPI, are negative.  Past Medical History:  Diagnosis Date  . A-fib ([place])   . Abdominal cyst    Multiple surgeries in [place]. "Heart Stopped" on the operating table during one of her "cyst" operations, probably 15-20 years ago  . Anxiety   . Diastolic CHF, acute (HCC)    BNP 179 in 1/11. Echo (1/11) with EF 60%, mild focal basal septal hypertrophy, grade I diastolic dysfunction, mild left atrial enlargement.   Marland Kitchen HTN (hypertension)   . Hypothyroidism   . Obesity   . S/P TAH (total abdominal hysterectomy)     Past Surgical History:  Procedure Laterality Date  . ABDOMINAL HYSTERECTOMY    . CHOLECYSTECTOMY    . HERNIA REPAIR      Social History:   reports that she has quit smoking. she  has never used smokeless tobacco. She reports that she does not drink alcohol or use drugs.  Allergies  Allergen Reactions  . Codeine Hives  . Contrast Media [Iodinated Diagnostic Agents] Other (See Comments)    unspecified  . Diazepam Hives  . Meperidine Hcl Other (See Comments)    unspecified  . Morphine Other (See Comments)    "caused heart attack"    Family History  Problem Relation Age of Onset  . Heart disease Father        Died of "heart attack" at age 34  . CAD Son        Stents.  Currently age 61  . Brain cancer Mother   . Hypertension Brother   . Brain cancer Brother   . Ulcers Son        bleeding ulcers  . Alzheimer's disease Brother        presumed to have died of heart attack  . Stomach cancer Daughter        died in her 50's     Prior to Admission medications  Physical Exam: Wt Readings from Last 3 Encounters:  03/12/17 74.4 kg (164 lb)  04/18/15 74.5 kg (164 lb 3.2 oz)  01/12/15 75.1 kg (165 lb 9.6 oz)   Vitals:   03/12/17 1657 03/12/17 1700 03/12/17 1715 03/12/17 1730  BP:  (!) 112/93 (!) 126/111 (!) 130/114  Pulse: 63   76  Resp: 13 (!) 26 (!) 23 17  Temp:      TempSrc:      SpO2: 100%   99%  Weight:      Height:          Constitutional: NAD, calm, comfortable- thin, frail appearing woman  Eyes: PERTLA, lids and conjunctivae normal ENMT: Mucous membranes are moist. Posterior pharynx clear of any exudate or lesions. Normal dentition.  Neck: normal, supple, no masses, no thyromegaly Respiratory: clear to auscultation bilaterally, no wheezing, no crackles. Normal respiratory effort. No accessory muscle use.  Cardiovascular: S1 & S2 heard, irregularly irregular rate and rhythm, no murmurs / rubs / gallops. 3 + pitting lower extremity edema from toes to mid shin- difficult to palpate dorsalis pedal pulses. No carotid bruits.  Abdomen: No distension, no  tenderness, no masses palpated. No hepatosplenomegaly. Bowel sounds normal.  Musculoskeletal: no clubbing / cyanosis. No joint deformity upper and lower extremities. Good ROM, no contractures. Normal muscle tone.  Skin: two shallow ulcers on left toes, erythema of left foot and ankle with tenderness- right foot is more bluish tinted rather than red-  Neurologic: CN 2-12 grossly intact. Sensation intact, DTR normal. Strength 5/5 in all 4 limbs.  Psychiatric: Normal judgment and insight. Alert and oriented x 3. Normal mood.     Labs on Admission: I have personally reviewed following labs and imaging studies  CBC: Recent Labs  Lab 03/12/17 1130  WBC 10.1  HGB 11.5*  HCT 36.6  MCV 81.3  PLT 865*   Basic Metabolic Panel: Recent Labs  Lab 03/12/17 1130  NA 135  K 4.0  CL 98*  CO2 21*  GLUCOSE 141*  BUN 27*  CREATININE 1.04*  CALCIUM 10.1   GFR: Estimated Creatinine Clearance: 40.6 mL/min (A) (by C-G formula based on SCr of 1.04 mg/dL (H)). Liver Function Tests: No results for input(s): AST, ALT, ALKPHOS, BILITOT, PROT, ALBUMIN in the last 168 hours. No results for input(s): LIPASE, AMYLASE in the last 168 hours. No results for input(s): AMMONIA in the last 168 hours. Coagulation Profile: No results for input(s): INR, PROTIME in the last 168 hours. Cardiac Enzymes: Recent Labs  Lab 03/12/17 1524  TROPONINI 0.29*   BNP (last 3 results) No results for input(s): PROBNP in the last 8760 hours. HbA1C: No results for input(s): HGBA1C in the last 72 hours. CBG: No results for input(s): GLUCAP in the last 168 hours. Lipid Profile: No results for input(s): CHOL, HDL, LDLCALC, TRIG, CHOLHDL, LDLDIRECT in the last 72 hours. Thyroid Function Tests: Recent Labs    03/12/17 1645  TSH 32.904*   Anemia Panel: No results for input(s): VITAMINB12, FOLATE, FERRITIN, TIBC, IRON, RETICCTPCT in the last 72 hours. Urine analysis:    Component Value Date/Time   COLORURINE AMBER (A)  03/12/2017 1338   APPEARANCEUR CLEAR 03/12/2017 1338   LABSPEC 1.026 03/12/2017 1338   PHURINE 5.0 03/12/2017 1338   GLUCOSEU 50 (A) 03/12/2017 1338   HGBUR NEGATIVE 03/12/2017 1338   BILIRUBINUR NEGATIVE 03/12/2017 1338   BILIRUBINUR negative 04/18/2015 1114   KETONESUR NEGATIVE 03/12/2017 1338   PROTEINUR 100 (A) 03/12/2017 1338   UROBILINOGEN negative  04/18/2015 1114   NITRITE NEGATIVE 03/12/2017 1338   LEUKOCYTESUR NEGATIVE 03/12/2017 1338   Sepsis Labs: @LABRCNTIP (procalcitonin:4,lacticidven:4) )No results found for this or any previous visit (from the past 240 hour(s)).   Radiological Exams on Admission: Ct Abdomen Pelvis Wo Contrast  Result Date: 03/12/2017 CLINICAL DATA:  Shortness of breath for several weeks. Upper abdominal pain. EXAM: CT ABDOMEN AND PELVIS WITHOUT CONTRAST TECHNIQUE: Multidetector CT imaging of the abdomen and pelvis was performed following the standard protocol without IV contrast. COMPARISON:  None. FINDINGS: Motion degraded study. Lower chest: Small to moderate bilateral pleural effusions with bibasilar collapse/ consolidation. The heart is enlarged. Coronary artery calcification is evident. Ground-glass attenuation in the lower lungs suggests edema. Hepatobiliary: No focal abnormality in the liver on this study without intravenous contrast. Gallbladder surgically absent. No intrahepatic or extrahepatic biliary dilation. Pancreas: No focal mass lesion. No dilatation of the main duct. No intraparenchymal cyst. No peripancreatic edema. Spleen: No splenomegaly. No focal mass lesion. Adrenals/Urinary Tract: No adrenal nodule or mass. Kidneys are unremarkable. No evidence for hydroureter. Bladder decompressed. Stomach/Bowel: Stomach is nondistended. No gastric wall thickening. No evidence of outlet obstruction. Distal stomach and proximal duodenum not well characterized due to the motion artifact. No small bowel wall thickening. No small bowel dilatation. Terminal  ileum not well seen. The appendix is not visualized, but there is no edema or inflammation in the region of the cecum. Diverticular changes are noted in the left colon without evidence of diverticulitis. Vascular/Lymphatic: There is abdominal aortic atherosclerosis without aneurysm. There is no gastrohepatic or hepatoduodenal ligament lymphadenopathy. No intraperitoneal or retroperitoneal lymphadenopathy. No pelvic sidewall lymphadenopathy. Reproductive: Uterus surgically absent.  There is no adnexal mass. Other: Apparent edema/ fluid in the retroperitoneum of the right abdomen, tracking in the anterior pararenal space inferior to the duodenum and down along the right psoas muscle. Musculoskeletal: Diffuse body wall edema bones are diffusely demineralized age indeterminate L2 compression fracture noted. IMPRESSION: 1. Study is limited by lack of intravenous contrast and patient motion. There appears to be a small amount of retroperitoneal edema inferior to the duodenum and pancreatic head. Duodenitis/peptic ulcer disease could cause this appearance. Groove pancreatitis would be another consideration. Gallbladder is surgically absent. 2. Diffuse body wall edema. 3. Small the moderate bilateral pleural effusions with diffuse ground-glass attenuation in the lower lungs suggesting edema. 4.  Aortic Atherosclerois (ICD10-170.0) 5. Colonic diverticulosis without definite findings of diverticulitis. Electronically Signed   By: Misty Stanley M.D.   On: 03/12/2017 14:17   Dg Chest 2 View  Result Date: 03/12/2017 CLINICAL DATA:  83 year old female with a history of shortness of breath an weakness EXAM: CHEST  2 VIEW COMPARISON:  None. FINDINGS: Cardiomediastinal silhouette enlarged. Interlobular septal thickening. Low lung volumes with apical kyphotic positioning secondary to kyphotic curvature of the spine. Lateral view demonstrates small pleural effusions. IMPRESSION: Small pleural effusions with evidence of mild  edema. Electronically Signed   By: Corrie Mckusick D.O.   On: 03/12/2017 12:32   Dg Foot Complete Left  Result Date: 03/12/2017 CLINICAL DATA:  Left foot necrosis EXAM: LEFT FOOT - COMPLETE 3+ VIEW COMPARISON:  None. FINDINGS: Diffuse osteopenia limits the exam. Mild degenerative changes at the DIP and PIP joints. Mild degenerative change at the first MTP joint. No obvious fracture. Vascular calcifications. Heterogeneous lucency in the calcaneus bone, possibly due to osteopenia. Dorsal soft tissue swelling. No soft tissue gas. IMPRESSION: Diffuse osteopenia limits the examination. No definite acute osseous abnormality is seen. Heterogeneous lucencies in the  calcaneus and metatarsals could be secondary to demineralization Electronically Signed   By: Donavan Foil M.D.   On: 03/12/2017 15:14    EKG: Independently reviewed. S-fib with RVR at 132, LAD, LBBB  Assessment/Plan Principal Problem:   A-fib with RVR - CHA2DS2-VASc Score at least 4  - has been started on Cardizem and heparin infusiona - will start oral Cardizem which will allow HR to hopefully come under control overnight and infusion to be titrated off by the AM - f/u ECHO   Active Problems: Mild Troponin elevation - f/u ECHO- cont to cycle troponin - cardiology following  Severe hypothyroidism - due to being off of Synthroid - will resume  Lactic acidosis - likely poor cardiac output in setting of A-fib with RVR, cellulitis and possibly being intravascularly depleted due to anasarca - she state her urine output has been poor and U sp gravity is on high side suggesting intravascular depletion - follow   Severe pedal edema/ anasarca - diffuse body wall edema noted on CT abd/pelvis - likely due to hypothyroidism and also venous stasis - f/u ECHO for right sided CHF - I don't feel that she has much left sided heart failure- she does not have any pulmonary edema on exam- lungs are clear and pulse ox on room air is 98/ 100 - low  dose Lasix given in ER- I recommend elevating feet and following  Erythema left foot and ankle - pink discoloration of left foot and ankle with significant edema- right foot is swollen as well but is more bluish in color rather than pink  - possibly cellulitis of left foot - has been given Ancef in ER- will continue and follow - ulcers on left toes are quite shallow and a result of ruptured blisters    Essential hypertension - currently will manage with Cardizem    Social issues: unable to afford medications, no transportation, poor health literacy as well as significant poverty - case management consulted   DVT prophylaxis: Heparin infusion Code Status: full code  Family Communication: son  Disposition Plan: admit to SDU   Consults called: cardiology  Admission status: admit    Debbe Odea MD [facility] Pager: [email or web address] Password [email or web address] 7PM-7AM, please contact night-coverage   03/12/2017, 6:03 PM

## 2017-03-12 NOTE — ED Notes (Signed)
ED Provider at bedside. 

## 2017-03-12 NOTE — ED Notes (Signed)
Troponin attempted to be collected x1 by tech; unsuccessful; phlebotomy in room now at 19:44 drawing.

## 2017-03-13 ENCOUNTER — Inpatient Hospital Stay (HOSPITAL_COMMUNITY): Payer: Medicare PPO

## 2017-03-13 DIAGNOSIS — E43 Unspecified severe protein-calorie malnutrition: Secondary | ICD-10-CM

## 2017-03-13 DIAGNOSIS — R2243 Localized swelling, mass and lump, lower limb, bilateral: Secondary | ICD-10-CM

## 2017-03-13 DIAGNOSIS — I4891 Unspecified atrial fibrillation: Secondary | ICD-10-CM

## 2017-03-13 DIAGNOSIS — D75839 Thrombocytosis, unspecified: Secondary | ICD-10-CM

## 2017-03-13 DIAGNOSIS — I248 Other forms of acute ischemic heart disease: Secondary | ICD-10-CM

## 2017-03-13 DIAGNOSIS — D473 Essential (hemorrhagic) thrombocythemia: Secondary | ICD-10-CM

## 2017-03-13 DIAGNOSIS — I5033 Acute on chronic diastolic (congestive) heart failure: Secondary | ICD-10-CM

## 2017-03-13 DIAGNOSIS — I481 Persistent atrial fibrillation: Principal | ICD-10-CM

## 2017-03-13 LAB — BASIC METABOLIC PANEL
ANION GAP: 12 (ref 5–15)
Anion gap: 13 (ref 5–15)
BUN: 28 mg/dL — AB (ref 6–20)
BUN: 37 mg/dL — ABNORMAL HIGH (ref 6–20)
CALCIUM: 8.9 mg/dL (ref 8.9–10.3)
CALCIUM: 8.9 mg/dL (ref 8.9–10.3)
CO2: 22 mmol/L (ref 22–32)
CO2: 22 mmol/L (ref 22–32)
CREATININE: 1.09 mg/dL — AB (ref 0.44–1.00)
Chloride: 100 mmol/L — ABNORMAL LOW (ref 101–111)
Chloride: 99 mmol/L — ABNORMAL LOW (ref 101–111)
Creatinine, Ser: 0.96 mg/dL (ref 0.44–1.00)
GFR calc Af Amer: >60 mL/min (ref >60)
GFR, EST AFRICAN AMERICAN: 52 mL/min — AB (ref >60)
GFR, EST NON AFRICAN AMERICAN: 52 mL/min — AB (ref >60)
GFR, EST NON AFRICAN AMERICAN: 45 mL/min — AB (ref >60)
GLUCOSE: 107 mg/dL — AB (ref 65–99)
Glucose, Bld: 154 mg/dL — ABNORMAL HIGH (ref 65–99)
POTASSIUM: 3.9 mmol/L (ref 3.5–5.1)
Potassium: 3.9 mmol/L (ref 3.5–5.1)
SODIUM: 135 mmol/L (ref 135–145)
SODIUM: 133 mmol/L — AB (ref 135–145)

## 2017-03-13 LAB — URINE CULTURE: Culture: NO GROWTH

## 2017-03-13 LAB — CBC
HEMATOCRIT: 33.8 % — AB (ref 36.0–46.0)
HEMOGLOBIN: 10.7 g/dL — AB (ref 12.0–15.0)
MCH: 25.8 pg — ABNORMAL LOW (ref 26.0–34.0)
MCHC: 31.7 g/dL (ref 30.0–36.0)
MCV: 81.6 fL (ref 78.0–100.0)
Platelets: 666 10*3/uL — ABNORMAL HIGH (ref 150–400)
RBC: 4.14 MIL/uL (ref 3.87–5.11)
RDW: 15.9 % — ABNORMAL HIGH (ref 11.5–15.5)
WBC: 11.1 10*3/uL — AB (ref 4.0–10.5)

## 2017-03-13 LAB — PREALBUMIN: Prealbumin: 16.2 mg/dL — ABNORMAL LOW (ref 18–38)

## 2017-03-13 LAB — TROPONIN I
TROPONIN I: 0.25 ng/mL — AB (ref <0.03)
Troponin I: 0.27 ng/mL (ref <0.03)

## 2017-03-13 LAB — MAGNESIUM: MAGNESIUM: 1.8 mg/dL (ref 1.7–2.4)

## 2017-03-13 LAB — ECHOCARDIOGRAM COMPLETE
HEIGHTINCHES: 69 in
WEIGHTICAEL: 2380.97 [oz_av]

## 2017-03-13 LAB — HEMOGLOBIN A1C
Hgb A1c MFr Bld: 5.2 % (ref 4.8–5.6)
Mean Plasma Glucose: 102.54 mg/dL

## 2017-03-13 LAB — BRAIN NATRIURETIC PEPTIDE: B Natriuretic Peptide: 1846.7 pg/mL — ABNORMAL HIGH (ref 0.0–100.0)

## 2017-03-13 LAB — MRSA PCR SCREENING: MRSA BY PCR: NEGATIVE

## 2017-03-13 LAB — HEPARIN LEVEL (UNFRACTIONATED)
HEPARIN UNFRACTIONATED: 0.56 [IU]/mL (ref 0.30–0.70)
HEPARIN UNFRACTIONATED: 0.31 [IU]/mL (ref 0.30–0.70)

## 2017-03-13 LAB — SEDIMENTATION RATE: SED RATE: 0 mm/h (ref 0–22)

## 2017-03-13 LAB — C-REACTIVE PROTEIN: CRP: 1 mg/dL — AB (ref <1.0)

## 2017-03-13 LAB — LACTIC ACID, PLASMA
LACTIC ACID, VENOUS: 1.7 mmol/L (ref 0.5–1.9)
Lactic Acid, Venous: 2.2 mmol/L (ref 0.5–1.9)

## 2017-03-13 MED ORDER — FUROSEMIDE 10 MG/ML IJ SOLN
40.0000 mg | Freq: Two times a day (BID) | INTRAMUSCULAR | Status: DC
  Administered 2017-03-13 – 2017-03-16 (×7): 40 mg via INTRAVENOUS
  Filled 2017-03-13 (×8): qty 4

## 2017-03-13 MED ORDER — LEVOTHYROXINE SODIUM 75 MCG PO TABS
150.0000 ug | ORAL_TABLET | Freq: Every day | ORAL | Status: DC
Start: 2017-03-14 — End: 2017-03-27
  Administered 2017-03-14 – 2017-03-27 (×14): 150 ug via ORAL
  Filled 2017-03-13 (×14): qty 2

## 2017-03-13 MED ORDER — CEFAZOLIN SODIUM-DEXTROSE 1-4 GM/50ML-% IV SOLN
1.0000 g | Freq: Three times a day (TID) | INTRAVENOUS | Status: DC
  Administered 2017-03-13 – 2017-03-15 (×6): 1 g via INTRAVENOUS
  Filled 2017-03-13 (×7): qty 50

## 2017-03-13 MED ORDER — ADULT MULTIVITAMIN W/MINERALS CH
1.0000 | ORAL_TABLET | Freq: Every day | ORAL | Status: DC
  Administered 2017-03-13 – 2017-03-27 (×14): 1 via ORAL
  Filled 2017-03-13 (×14): qty 1

## 2017-03-13 MED ORDER — ALUM & MAG HYDROXIDE-SIMETH 200-200-20 MG/5ML PO SUSP
15.0000 mL | Freq: Four times a day (QID) | ORAL | Status: DC | PRN
  Administered 2017-03-13 – 2017-03-21 (×3): 15 mL via ORAL
  Filled 2017-03-13 (×4): qty 30

## 2017-03-13 MED ORDER — COLLAGENASE 250 UNIT/GM EX OINT
TOPICAL_OINTMENT | Freq: Every day | CUTANEOUS | Status: DC
  Administered 2017-03-13 – 2017-03-27 (×13): via TOPICAL
  Filled 2017-03-13: qty 30

## 2017-03-13 MED ORDER — ENSURE ENLIVE PO LIQD
237.0000 mL | Freq: Three times a day (TID) | ORAL | Status: DC
  Administered 2017-03-13 – 2017-03-24 (×26): 237 mL via ORAL

## 2017-03-13 MED ORDER — DILTIAZEM HCL 60 MG PO TABS
60.0000 mg | ORAL_TABLET | Freq: Three times a day (TID) | ORAL | Status: DC
  Administered 2017-03-13 – 2017-03-14 (×4): 60 mg via ORAL
  Filled 2017-03-13 (×3): qty 1

## 2017-03-13 MED ORDER — ALPRAZOLAM 0.5 MG PO TABS
0.5000 mg | ORAL_TABLET | Freq: Every day | ORAL | Status: DC | PRN
Start: 2017-03-13 — End: 2017-03-27
  Administered 2017-03-13 – 2017-03-24 (×8): 0.5 mg via ORAL
  Filled 2017-03-13 (×8): qty 1

## 2017-03-13 MED ORDER — CALCIUM CARBONATE ANTACID 500 MG PO CHEW
1.0000 | CHEWABLE_TABLET | Freq: Three times a day (TID) | ORAL | Status: DC
  Administered 2017-03-14 – 2017-03-25 (×33): 200 mg via ORAL
  Filled 2017-03-13 (×34): qty 1

## 2017-03-13 MED ORDER — SODIUM CHLORIDE 0.9 % IV BOLUS (SEPSIS)
500.0000 mL | Freq: Once | INTRAVENOUS | Status: AC
Start: 2017-03-13 — End: 2017-03-13
  Administered 2017-03-13: 500 mL via INTRAVENOUS

## 2017-03-13 MED ORDER — HYDROCODONE-ACETAMINOPHEN 5-325 MG PO TABS
2.0000 | ORAL_TABLET | Freq: Once | ORAL | Status: AC
  Administered 2017-03-13: 2 via ORAL
  Filled 2017-03-13: qty 2

## 2017-03-13 NOTE — Progress Notes (Signed)
CRITICAL VALUE ALERT  Critical Value:  Lactic Acid  Date & Time Notied:  0140  Provider Notified: yes  Orders Received/Actions taken: trending

## 2017-03-13 NOTE — Progress Notes (Signed)
[place] for heparin  Indication: atrial fibrillation  Allergies  Allergen Reactions  . Codeine Hives  . Contrast Media [Iodinated Diagnostic Agents] Other (See Comments)    unspecified  . Diazepam Hives  . Meperidine Hcl Other (See Comments)    unspecified  . Morphine Other (See Comments)    "caused heart attack"    Patient Measurements: Height: 5\' 9"  (175.3 cm) Weight: 148 lb 13 oz (67.5 kg) IBW/kg (Calculated) : 66.2   Vital Signs: Temp: 97.6 F (36.4 C) (12/28 0800) Temp Source: Oral (12/28 0800) BP: 119/77 (12/28 1400) Pulse Rate: 44 (12/28 1400)  Labs: Recent Labs    03/12/17 1130  03/12/17 1936 03/12/17 2335 03/13/17 0341 03/13/17 0731 03/13/17 1133  HGB 11.5*  --   --   --  10.7*  --   --   HCT 36.6  --   --   --  33.8*  --   --   PLT 717*  --   --   --  666*  --   --   HEPARINUNFRC  --   --   --   --  0.56  --  0.31  CREATININE 1.04*  --   --   --  0.96  --   --   TROPONINI  --    < > 0.29* 0.25*  --  0.27*  --    < > = values in this interval not displayed.    Estimated Creatinine Clearance: 44 mL/min (by C-G formula based on SCr of 0.96 mg/dL).   Medical History: Past Medical History:  Diagnosis Date  . A-fib ([place])   . Abdominal cyst    Multiple surgeries in [place]. "Heart Stopped" on the operating table during one of her "cyst" operations, probably 15-20 years ago  . Anxiety   . Diastolic CHF, acute (HCC)    BNP 179 in 1/11. Echo (1/11) with EF 60%, mild focal basal septal hypertrophy, grade I diastolic dysfunction, mild left atrial enlargement.   Marland Kitchen HTN (hypertension)   . Hypothyroidism   . Obesity   . S/P TAH (total abdominal hysterectomy)     Medications:  Medications Prior to Admission  Medication Sig Dispense Refill Last Dose  . acetaminophen (TYLENOL) 325 MG tablet Take 650 mg by mouth every 6 (six) hours as needed for mild pain.   Past Month at Unknown time  . ALPRAZolam (XANAX) 0.25 MG  tablet Take 1 tablet (0.25 mg total) by mouth 2 (two) times daily. (Patient taking differently: Take 0.5 mg by mouth daily as needed for anxiety. ) 60 tablet 2 03/11/2017 at Unknown time  . aspirin (ASPIR-LOW) 81 MG EC tablet Take 81 mg by mouth daily.     03/11/2017 at Unknown time  . DM-Doxylamine-Acetaminophen (NYQUIL COLD & FLU PO) Take 15 mLs by mouth at bedtime as needed (cough).   Past Week at Unknown time  . ibuprofen (ADVIL,MOTRIN) 200 MG tablet Take 200 mg by mouth every 6 (six) hours as needed for moderate pain.   unk at unk  . benazepril-hydrochlorthiazide (LOTENSIN HCT) 20-12.5 MG per tablet Take 1 tablet by mouth 2 (two) times daily. (Patient not taking: Reported on 03/12/2017) 60 tablet 11 Not Taking at Unknown time  . donepezil (ARICEPT) 10 MG tablet Take 0.5 tablets (5 mg total) by mouth at bedtime. (Patient not taking: Reported on 03/12/2017) 30 tablet 5 Not Taking at Unknown time  . furosemide (LASIX) 40 MG tablet TAKE 1 TABLET (40  MG TOTAL) BY MOUTH DAILY. (Patient not taking: Reported on 03/12/2017) 30 tablet 5 Not Taking at Unknown time  . levothyroxine (SYNTHROID, LEVOTHROID) 137 MCG tablet Take 1 tablet (137 mcg total) by mouth daily. (Patient not taking: Reported on 03/12/2017) 30 tablet 5 Not Taking at Unknown time  . metoprolol succinate (TOPROL-XL) 100 MG 24 hr tablet Take 1 tablet (100 mg total) by mouth daily. (Patient not taking: Reported on 03/12/2017) 30 tablet 5 Not Taking at Unknown time   Scheduled:  . collagenase   Topical Daily  . diltiazem  60 mg Oral Q8H  . feeding supplement (ENSURE ENLIVE)  237 mL Oral TID BM  . furosemide  40 mg Intravenous BID  . Influenza vac split quadrivalent PF  0.5 mL Intramuscular Tomorrow-1000  . [START ON 03/14/2017] levothyroxine  150 mcg Oral QAC breakfast  . multivitamin with minerals  1 tablet Oral Daily  . pneumococcal 23 valent vaccine  0.5 mL Intramuscular Tomorrow-1000  . sodium chloride flush  3 mL Intravenous Q12H     Assessment: 83 yo female on heparin for afib. Noted plans for possible apixaban. -heparin level confirmed at goal on 1000 units/hr  Goal of Therapy:  Heparin level 0.3-0.7 units/ml Monitor platelets by anticoagulation protocol: Yes   Plan:  -No heparin changes needed -Daily CBC and heparin level  -Will follow oral anticoagulation plans  Hildred Laser, Pharm D 03/13/2017 2:15 PM

## 2017-03-13 NOTE — Progress Notes (Signed)
   Notified the patient was having runs of NSVT for 5-7 beats. Repeat EKG obtained and showed atrial fibrillation with RVR, HR 109, with known LBBB. No acute changes. Will obtain STAT BMET and Mg.   Signed, Erma Heritage, PA-C 03/13/2017, 4:50 PM

## 2017-03-13 NOTE — Progress Notes (Signed)
PROGRESS NOTE  Tina West HQI:[id number] DOB: 1928/08/27 DOA: 03/12/2017 PCP: Claretta Fraise, MD  Brief Narrative: 88yow presented with LE edema, orthopnea, DOE. Admitted for (new) afib/RVR, elevated torponin,   Assessment/Plan Afib RVR, new onset with associated demand ischemia. CHA2DS2-VASc 5. - HR better controlled, cardiology converting IV >> PO diltiazem - ideally needs anticoagulation but financial limitations may limit NOAC use and non-compliance makes warfarin suboptimal. CM consulted  Acute on chronic diastolic CHF with anasarca and associated demand ischemia  - IV Lasix, follow daily BMP, strict I/O - f/u echo  Possible cellulitis left foot with ulcerations noted present on admission (loook like secondary to ruptured bulla but do suspect PAD). No evidence of acute limb ischemia. - consult wound care, VVS - check ABI   Hypothyroidism, TSH 32.9, off outpatient meds - resume levothyroxine @ 137 mcg  Essential HTN - stable  Thrombocytosis, chronic - f/u as an outpatient  Normocytic anemia.  On CT small amount of retroperitoneal edema inferior to the duodenum and pancreatic head. Duodenitis/peptic ulcer disease could cause this appearance. Groove pancreatitis would be another consideration. - significance unclear, patient asymptomatic - check LFTs and lipase in AM  Aortic atherosclerosis  - statin  DVT prophylaxis: heparin gtt Code Status: full Family Communication: son at bedside Disposition Plan: home    Murray Hodgkins, MD  [facility] Direct contact: [phone] --[place] [place]  --[email or web address]; password [email or web address]  7PM-7AM contact night coverage as above 03/13/2017, 11:21 AM  LOS: 1 day   Consultants:   cardiology  Procedures:    Antimicrobials:  Cefazolin 12/27 >>  Interval history/Subjective: Feels better, breathing better. Eating better.  Objective: Vitals:  Vitals:   03/13/17 0800 03/13/17 1000  BP: (!) 119/106 117/84    Pulse: (!) 43 61  Resp: 19 17  Temp: 97.6 F (36.4 C)   SpO2: 98% 95%    Exam:  Constitutional:  . Appears calm and comfortable Eyes:  . pupils and irises appear normal . Normal lids ENMT:  . grossly normal hearing  . Lips appear normal Respiratory:  . CTA bilaterally, no w/r/r.  . Respiratory effort normal.  Cardiovascular:  . RRR, no m/r/g . 2+ pedal edema   1+ bilateral DP pulses; feet warm, dry Skin:  . No rashes, lesions, ulcers. Bilateral LE rubor noted. . palpation of skin: no induration or nodules Psychiatric:  . Mental status o Mood, affect appropriate  I have personally reviewed the following:  Filed Weights   03/12/17 1141 03/12/17 2047 03/13/17 0323  Weight: 74.4 kg (164 lb) 67.5 kg (148 lb 13 oz) 67.5 kg (148 lb 13 oz)   Weight change:   Labs:  BMP unremarkable  troponins .29 >> .25 >> .27  Hgb 10.7  Plts noted  Hgb A1c 5.2  Scheduled Meds: . diltiazem  60 mg Oral Q8H  . furosemide  40 mg Intravenous BID  . Influenza vac split quadrivalent PF  0.5 mL Intramuscular Tomorrow-1000  . [START ON 03/14/2017] levothyroxine  150 mcg Oral QAC breakfast  . pneumococcal 23 valent vaccine  0.5 mL Intramuscular Tomorrow-1000  . sodium chloride flush  3 mL Intravenous Q12H   Continuous Infusions: . sodium chloride    . heparin 1,000 Units/hr (03/12/17 1838)    Principal Problem:   A-fib [facility]) Active Problems:   Essential hypertension   Left leg cellulitis   Hypothyroidism   Demand ischemia (HCC)   Acute on chronic diastolic CHF (congestive heart failure) (HCC)   Thrombocytosis ([place])  LOS: 1 day          

## 2017-03-13 NOTE — Progress Notes (Signed)
Progress Note  Patient Name: Tina West Date of Encounter: 03/13/2017  Primary Cardiologist: Dr. Percival Spanish  Subjective   Still having dyspnea with minimal activity and orthopnea. No chest discomfort. Reports feeling her "heart racing" intermittently throughout the day.    Inpatient Medications    Scheduled Meds: . Influenza vac split quadrivalent PF  0.5 mL Intramuscular Tomorrow-1000  . pneumococcal 23 valent vaccine  0.5 mL Intramuscular Tomorrow-1000  . sodium chloride flush  3 mL Intravenous Q12H   Continuous Infusions: . sodium chloride    . diltiazem (CARDIZEM) infusion 7.5 mg/hr (03/13/17 0315)  . heparin 1,000 Units/hr (03/12/17 1838)   PRN Meds: sodium chloride, acetaminophen **OR** acetaminophen, ondansetron **OR** ondansetron (ZOFRAN) IV, sodium chloride flush   Vital Signs    Vitals:   03/13/17 0500 03/13/17 0600 03/13/17 0700 03/13/17 0800  BP: 119/80 102/76 (!) 116/93 (!) 119/106  Pulse: 85 (!) 102 (!) 115 (!) 43  Resp: 20 (!) 23 17 19   Temp:      TempSrc:      SpO2: 97% 97% 99% 98%  Weight:      Height:        Intake/Output Summary (Last 24 hours) at 03/13/2017 0848 Last data filed at 03/13/2017 0325 Gross per 24 hour  Intake 240 ml  Output 0 ml  Net 240 ml   Filed Weights   03/12/17 1141 03/12/17 2047 03/13/17 0323  Weight: 164 lb (74.4 kg) 148 lb 13 oz (67.5 kg) 148 lb 13 oz (67.5 kg)    Telemetry    Atrial fibrillation, HR in 80's to low-100's. Occasional PVC's.  - Personally Reviewed  ECG    No new tracings.   Physical Exam   General: Well developed, elderly Caucasian female appearing in no acute distress. Head: Normocephalic, atraumatic.  Neck: Supple without bruits, JVD at 9cm. Lungs:  Resp regular and unlabored, decreased breath sounds along bases bilaterally. Heart: Irregularly irregular, S1, S2, no S3, S4, or murmur; no rub. Abdomen: Soft, non-tender, non-distended with normoactive bowel sounds. No hepatomegaly. No  rebound/guarding. No obvious abdominal masses. Extremities: No clubbing or cyanosis, 1+ pitting edema up to knees bilaterally. Distal pedal pulses are diminished bilaterally. Ulcers along both feet in various stages of healing.  Neuro: Alert and oriented X 3. Moves all extremities spontaneously. Psych: Normal affect.  Labs    Chemistry Recent Labs  Lab 03/12/17 1130 03/13/17 0341  NA 135 135  K 4.0 3.9  CL 98* 100*  CO2 21* 22  GLUCOSE 141* 107*  BUN 27* 28*  CREATININE 1.04* 0.96  CALCIUM 10.1 8.9  GFRNONAA 47* 52*  GFRAA 55* >60  ANIONGAP 16* 13     Hematology Recent Labs  Lab 03/12/17 1130 03/13/17 0341  WBC 10.1 11.1*  RBC 4.50 4.14  HGB 11.5* 10.7*  HCT 36.6 33.8*  MCV 81.3 81.6  MCH 25.6* 25.8*  MCHC 31.4 31.7  RDW 15.4 15.9*  PLT 717* 666*    Cardiac Enzymes Recent Labs  Lab 03/12/17 1524 03/12/17 1936 03/12/17 2335  TROPONINI 0.29* 0.29* 0.25*    Recent Labs  Lab 03/12/17 1148  TROPIPOC 0.31*     BNP Recent Labs  Lab 03/12/17 2335  BNP 1,846.7*     DDimer No results for input(s): DDIMER in the last 168 hours.   Radiology    Ct Abdomen Pelvis Wo Contrast  Result Date: 03/12/2017 CLINICAL DATA:  Shortness of breath for several weeks. Upper abdominal pain. EXAM: CT ABDOMEN AND PELVIS WITHOUT CONTRAST  TECHNIQUE: Multidetector CT imaging of the abdomen and pelvis was performed following the standard protocol without IV contrast. COMPARISON:  None. FINDINGS: Motion degraded study. Lower chest: Small to moderate bilateral pleural effusions with bibasilar collapse/ consolidation. The heart is enlarged. Coronary artery calcification is evident. Ground-glass attenuation in the lower lungs suggests edema. Hepatobiliary: No focal abnormality in the liver on this study without intravenous contrast. Gallbladder surgically absent. No intrahepatic or extrahepatic biliary dilation. Pancreas: No focal mass lesion. No dilatation of the main duct. No  intraparenchymal cyst. No peripancreatic edema. Spleen: No splenomegaly. No focal mass lesion. Adrenals/Urinary Tract: No adrenal nodule or mass. Kidneys are unremarkable. No evidence for hydroureter. Bladder decompressed. Stomach/Bowel: Stomach is nondistended. No gastric wall thickening. No evidence of outlet obstruction. Distal stomach and proximal duodenum not well characterized due to the motion artifact. No small bowel wall thickening. No small bowel dilatation. Terminal ileum not well seen. The appendix is not visualized, but there is no edema or inflammation in the region of the cecum. Diverticular changes are noted in the left colon without evidence of diverticulitis. Vascular/Lymphatic: There is abdominal aortic atherosclerosis without aneurysm. There is no gastrohepatic or hepatoduodenal ligament lymphadenopathy. No intraperitoneal or retroperitoneal lymphadenopathy. No pelvic sidewall lymphadenopathy. Reproductive: Uterus surgically absent.  There is no adnexal mass. Other: Apparent edema/ fluid in the retroperitoneum of the right abdomen, tracking in the anterior pararenal space inferior to the duodenum and down along the right psoas muscle. Musculoskeletal: Diffuse body wall edema bones are diffusely demineralized age indeterminate L2 compression fracture noted. IMPRESSION: 1. Study is limited by lack of intravenous contrast and patient motion. There appears to be a small amount of retroperitoneal edema inferior to the duodenum and pancreatic head. Duodenitis/peptic ulcer disease could cause this appearance. Groove pancreatitis would be another consideration. Gallbladder is surgically absent. 2. Diffuse body wall edema. 3. Small the moderate bilateral pleural effusions with diffuse ground-glass attenuation in the lower lungs suggesting edema. 4.  Aortic Atherosclerois (ICD10-170.0) 5. Colonic diverticulosis without definite findings of diverticulitis. Electronically Signed   By: Misty Stanley M.D.    On: 03/12/2017 14:17   Dg Chest 2 View  Result Date: 03/12/2017 CLINICAL DATA:  83 year old female with a history of shortness of breath an weakness EXAM: CHEST  2 VIEW COMPARISON:  None. FINDINGS: Cardiomediastinal silhouette enlarged. Interlobular septal thickening. Low lung volumes with apical kyphotic positioning secondary to kyphotic curvature of the spine. Lateral view demonstrates small pleural effusions. IMPRESSION: Small pleural effusions with evidence of mild edema. Electronically Signed   By: Corrie Mckusick D.O.   On: 03/12/2017 12:32   Dg Foot Complete Left  Result Date: 03/12/2017 CLINICAL DATA:  Left foot necrosis EXAM: LEFT FOOT - COMPLETE 3+ VIEW COMPARISON:  None. FINDINGS: Diffuse osteopenia limits the exam. Mild degenerative changes at the DIP and PIP joints. Mild degenerative change at the first MTP joint. No obvious fracture. Vascular calcifications. Heterogeneous lucency in the calcaneus bone, possibly due to osteopenia. Dorsal soft tissue swelling. No soft tissue gas. IMPRESSION: Diffuse osteopenia limits the examination. No definite acute osseous abnormality is seen. Heterogeneous lucencies in the calcaneus and metatarsals could be secondary to demineralization Electronically Signed   By: Donavan Foil M.D.   On: 03/12/2017 15:14    Cardiac Studies   Echocardiogram: Pending  Patient Profile     83 y.o. female w/ PMH of chronic diastolic CHF, HTN, hypothyroidism, and anxiety who presented to [facility] ED on 03/12/2017 for evaluation of worsening edema, orthopnea, and lower  extremity edema in the setting of medication noncompliance secondary to financial issues.   Assessment & Plan    1. New-onset Atrial fibrillation with RVR - presented with worsening respiratory distress and lower extremity edema over the past few weeks.   - EKG shows new LBBB since EKG in 2011. K+ WNL. TSH elevated to 32.904 in the setting of untreated hypothyroidism.  - This patients CHA2DS2-VASc  Score and unadjusted Ischemic Stroke Rate (% per year) is equal to 7.2 % stroke rate/year from a score of 5 (CHF, HTN, Female, Age (2)). Currently on Heparin. Will need to address starting long-term anticoagulation. Medical compliance may be a hindrance for Coumadin while financial limitations would limit the use of NOAC's. Will consult Case Management for patient assistance options.  - currently on IV Cardizem with HR in the 80's to low-100's. Will switch to short-acting Cardizem and convert to  Cardizem CD tomorrow if HR and BP remain well-controlled and EF is preserved on echo.   2. Acute on chronic diastolic CHF - Last echo in 2011 showed normal LV systolic function with EF 60% and grade 1 DD.  - BNP elevated to 1846 with CXR consistent with CHF. Repeat echocardiogram is pending.  - received IV Lasix on admission. None currently ordered but she still has diminished breath sounds and pitting edema on examination. Will start IV Lasix 40mg  BID. Follow I&O's along with daily weights. Repeat BMET in AM.   3. Elevated Troponin - values have been flat at 0.29, 0.29, and 0.25. - likely secondary to demand ischemia in the setting of her CHF exacerbation and atrial fibrillation with RVR.  - echo is pending to assess LV function and wall motion.   4. Hypothyroidism - TSH significantly elevated at 32. Has not taken Levothyroxine within the past year.  - recommend resuming Synthroid.   5. Possible infection/ Ulceration of Left Foot - Lactic Acid 3.19 on admission, improved to 1.7 on most recent check.  - has significant lower extremity ulcers with diminished pulses. Possible source of her infection.  - Vascular Surgery consult recommended.    For questions or updates, please contact [place] Please consult [email or web address] for contact info under Cardiology/STEMI.   Arna Medici , PA-C 8:48 AM 03/13/2017 Pager: [phone]   Attending Note:   The patient was seen and  examined.  Agree with assessment and plan as noted above.  Changes made to the above note as needed.  Patient seen and independently examined with Bernerd Pho, [place] .   We discussed all aspects of the encounter. I agree with the assessment and plan as stated above.  1.   Atrial fib:  HR is improving . Would transition to PO diltiazem instead of IV dilt Would try to get her on Eliquis - she has a very poor social situation and would not be able to manage coumdin / INR testing.  Echo pending    I have spent a total of 40 minutes with patient reviewing hospital  notes , telemetry, EKGs, labs and examining patient as well as establishing an assessment and plan that was discussed with the patient. > 50% of time was spent in direct patient care.    Thayer Headings, Brooke Bonito., MD, [facility] 03/13/2017, 9:49 AM [address],  [place] Pager [phone]

## 2017-03-13 NOTE — Progress Notes (Signed)
  Echocardiogram 2D Echocardiogram has been performed.  Tina West 03/13/2017, 11:21 AM

## 2017-03-13 NOTE — Consult Note (Addendum)
[place] Nurse wound consult note Reason for Consult: Consult requested for bilat feet.   Wound type: 3 areas of full thickness wounds to anterior areas with tightly adhered yellow slough, painful to touch.  Pt has been applying Vinegar at home. Measurement: Left great toe; 2X2cm, left 2nd toe 2X.5cm, Right great toe .8X.3cm Wound bed: All are dry yellow, no odor, drainage, or fluctuance. Dressing procedure/placement/frequency: Santyl ointment to provide enzymatic debridement of nonviable tissue.  Discussed plan of care with patient and family member at the bedside. Please re-consult if further assistance is needed.  Thank-you,  Julien Girt MSN, [place], [place], [place], [place]

## 2017-03-13 NOTE — Progress Notes (Signed)
ANTICOAGULATION CONSULT NOTE - Follow Up Consult  Pharmacy Consult for heparin Indication: atrial fibrillation  Labs: Recent Labs    03/12/17 1130 03/12/17 1524 03/12/17 1936 03/12/17 2335 03/13/17 0341  HGB 11.5*  --   --   --  10.7*  HCT 36.6  --   --   --  33.8*  PLT 717*  --   --   --  666*  HEPARINUNFRC  --   --   --   --  0.56  CREATININE 1.04*  --   --   --   --   TROPONINI  --  0.29* 0.29* 0.25*  --     Assessment/Plan:  83yo female therapeutic on heparin with initial dosing for Afib. Will continue gtt at current rate and confirm stable with additional level.   Wynona Neat, PharmD, BCPS  03/13/2017,4:42 AM

## 2017-03-13 NOTE — Plan of Care (Signed)
Care plan initiated.

## 2017-03-13 NOTE — Consult Note (Signed)
Hospital Consult    Reason for Consult:  Non healing wounds Requesting Physician:  Sarajane Jews ([facility]) MRN #:  [id number]  History of Present Illness:   Tina West is a 83 y.o. (1928-11-16) female who came to the hospital yesterday with increasing shortness of breath with some chest pain the son and pt states this has been going on for about 3 weeks.  Pt is a poor historian, so history is reconstructed from son and chart.   The son states that the pt has been developing lower extremity swelling over the past month and that the redness has also been present for about 3 weeks as well.  In the ER, she was found to be in afib and has been placed on a heparin gtt.  She was also found to have non healing wounds.  She states that she was able to get around before December 14, but has needed crutches also. She states that she feel last December and these have been present since then.  She states she feels like she has some swelling in her abdomen with some pain.  Per internal medicine, pt had a CT scan and on CT small amount of retroperitoneal edema inferior to the duodenum and pancreatic head. She also has a thrombocytosis on this admission as well.   Her son stated that due to financial constrictions, the pt has not been taking her medications in the past year.  Cardiology was consulted for increased troponins, CHF and afib.    Past Medical History:  Diagnosis Date  . A-fib ([place])   . Abdominal cyst    Multiple surgeries in [place]. "Heart Stopped" on the operating table during one of her "cyst" operations, probably 15-20 years ago  . Anxiety   . Diastolic CHF, acute ([facility])    BNP 179 in 1/11. Echo (1/11) with EF 60%, mild focal basal septal hypertrophy, grade I diastolic dysfunction, mild left atrial enlargement.   Marland Kitchen HTN (hypertension)   . Hypothyroidism   . Obesity   . S/P TAH (total abdominal hysterectomy)     Past Surgical History:  Procedure Laterality Date  . ABDOMINAL  HYSTERECTOMY    . CHOLECYSTECTOMY    . HERNIA REPAIR      Allergies  Allergen Reactions  . Codeine Hives  . Contrast Media [Iodinated Diagnostic Agents] Other (See Comments)    unspecified  . Diazepam Hives  . Meperidine Hcl Other (See Comments)    unspecified  . Morphine Other (See Comments)    "caused heart attack"    Prior to Admission medications   Medication Sig Start Date End Date Taking? Authorizing Provider  acetaminophen (TYLENOL) 325 MG tablet Take 650 mg by mouth every 6 (six) hours as needed for mild pain.   Yes [provider]  ALPRAZolam (XANAX) 0.25 MG tablet Take 1 tablet (0.25 mg total) by mouth 2 (two) times daily. Patient taking differently: Take 0.5 mg by mouth daily as needed for anxiety.  07/14/14  Yes Claretta Fraise, MD  aspirin (ASPIR-LOW) 81 MG EC tablet Take 81 mg by mouth daily.     Yes [provider]  DM-Doxylamine-Acetaminophen (NYQUIL COLD & FLU PO) Take 15 mLs by mouth at bedtime as needed (cough).   Yes [provider]  ibuprofen (ADVIL,MOTRIN) 200 MG tablet Take 200 mg by mouth every 6 (six) hours as needed for moderate pain.   Yes [provider]  benazepril-hydrochlorthiazide (LOTENSIN HCT) 20-12.5 MG per tablet Take 1 tablet by mouth 2 (  two) times daily. Patient not taking: Reported on 03/12/2017 10/25/14   Claretta Fraise, MD  donepezil (ARICEPT) 10 MG tablet Take 0.5 tablets (5 mg total) by mouth at bedtime. Patient not taking: Reported on 03/12/2017 11/10/14   Claretta Fraise, MD  furosemide (LASIX) 40 MG tablet TAKE 1 TABLET (40 MG TOTAL) BY MOUTH DAILY. Patient not taking: Reported on 03/12/2017 10/25/14   Claretta Fraise, MD  levothyroxine (SYNTHROID, LEVOTHROID) 137 MCG tablet Take 1 tablet (137 mcg total) by mouth daily. Patient not taking: Reported on 03/12/2017 10/26/14   Claretta Fraise, MD  metoprolol succinate (TOPROL-XL) 100 MG 24 hr tablet Take 1 tablet (100 mg total) by mouth daily. Patient not taking:  Reported on 03/12/2017 11/10/14   Claretta Fraise, MD    Social History   Socioeconomic History  . Marital status: Widowed    Spouse name: Not on file  . Number of children: 1  . Years of education: Not on file  . Highest education level: Not on file  Social Needs  . Financial resource strain: Not on file  . Food insecurity - worry: Not on file  . Food insecurity - inability: Not on file  . Transportation needs - medical: Not on file  . Transportation needs - non-medical: Not on file  Occupational History  . Not on file  Tobacco Use  . Smoking status: Former Research scientist (life sciences)  . Smokeless tobacco: Never Used  . Tobacco comment: quit smoking at age 32  Substance and Sexual Activity  . Alcohol use: No    Frequency: Never  . Drug use: No  . Sexual activity: Not on file  Other Topics Concern  . Not on file  Social History Narrative   Originally from [place], now lives in [place]. She lives with her son. Her grandson and his wife live nearby.     Family History  Problem Relation Age of Onset  . Heart disease Father        Died of "heart attack" at age 19  . CAD Son        Stents.  Currently age 74  . Brain cancer Mother   . Hypertension Brother   . Brain cancer Brother   . Ulcers Son        bleeding ulcers  . Alzheimer's disease Brother        presumed to have died of heart attack  . Stomach cancer Daughter        died in her 75's    ROS: [x]  Positive   [ ]  Negative   [ ]  All sytems reviewed and are negative  Cardiac: [x]  chest pain/pressure []  palpitations [x]  SOB lying flat [x]  DOE [x]  afib  Vascular: []  pain in legs while walking []  pain in legs at rest []  pain in legs at night [x]  non-healing ulcers []  hx of DVT [x]  swelling in legs [x]  redness right leg   Pulmonary: []  productive cough []  asthma/wheezing []  home O2  Neurologic: []  weakness in []  arms []  legs []  numbness in []  arms []  legs []  hx of CVA []  mini stroke [] difficulty speaking or  slurred speech []  temporary loss of vision in one eye []  dizziness  Hematologic: []  hx of cancer []  bleeding problems []  problems with blood clotting easily  Endocrine:   []  diabetes [x]  thyroid disease  GI []  vomiting blood []  blood in stool  GU: []  CKD/renal failure []  HD--[]  M/W/F or []  T/T/S []  burning with urination []  blood in urine  Psychiatric: []   anxiety []  depression  Musculoskeletal: []  arthritis []  joint pain  Integumentary: []  rashes []  ulcers  Constitutional: [x]  fever [x]  chills   Physical Examination  Vitals:   03/13/17 0800 03/13/17 1000  BP: (!) 119/106 117/84  Pulse: (!) 43 61  Resp: 19 17  Temp: 97.6 F (36.4 C)   SpO2: 98% 95%   Body mass index is 21.98 kg/m.  General:  Mild distress with mildly labored breathing Gait: Not observed HENT: WNL, normocephalic Pulmonary: mildly labored breathing, without Rales, rhonchi,  wheezing Cardiac: irregular, without  Murmurs, rubs or gallops; without carotid bruits Abdomen:  soft, NT/ND, no masses; +BS Skin: without rashes Vascular Exam/Pulses:  Right Left  Radial 2+ (normal) 2+ (normal)  Ulnar Unable to palpate  Unable to palpate   Femoral 2+ (normal) 2+ (normal)  Popliteal Unable to palpate  Unable to palpate   AT Monophasic doppler signal Monophasic doppler signal  PT Brisk doppler signal Brisk doppler signal  Peroneal Monophasic doppler signal Monophasic doppler signal   Extremities:         Musculoskeletal: no muscle wasting or atrophy  Neurologic: A&O X 3;  No focal weakness or paresthesias are detected; speech is fluent/normal Lymph:  No inguinal lymphadenopathy Psychiatric: Judgment intact, Mood & affect appropriate for pt's clinical situation    CBC    Component Value Date/Time   WBC 11.1 (H) 03/13/2017 0341   RBC 4.14 03/13/2017 0341   HGB 10.7 (L) 03/13/2017 0341   HGB 12.3 04/18/2015 0951   HCT 33.8 (L) 03/13/2017 0341   HCT 38.2 04/18/2015 0951   PLT  666 (H) 03/13/2017 0341   PLT 577 (H) 04/18/2015 0951   MCV 81.6 03/13/2017 0341   MCV 88 04/18/2015 0951   MCH 25.8 (L) 03/13/2017 0341   MCHC 31.7 03/13/2017 0341   RDW 15.9 (H) 03/13/2017 0341   RDW 14.2 04/18/2015 0951   LYMPHSABS 1.4 04/18/2015 0951   EOSABS 0.1 04/18/2015 0951   BASOSABS 0.0 04/18/2015 0951    BMET    Component Value Date/Time   NA 135 03/13/2017 0341   NA 142 04/18/2015 0951   K 3.9 03/13/2017 0341   CL 100 (L) 03/13/2017 0341   CO2 22 03/13/2017 0341   GLUCOSE 107 (H) 03/13/2017 0341   BUN 28 (H) 03/13/2017 0341   BUN 26 04/18/2015 0951   CREATININE 0.96 03/13/2017 0341   CALCIUM 8.9 03/13/2017 0341   GFRNONAA 52 (L) 03/13/2017 0341   GFRAA >60 03/13/2017 0341    COAGS: No results found for: INR, PROTIME   Non-Invasive Vascular Imaging:   none  Statin:  No. Beta Blocker:  No. Aspirin:  Yes.   ACEI:  No. ARB:  No. CCB use:  No Other antiplatelets/anticoagulants:  No.  Heparin gtt now (nothing at home)   ASSESSMENT/PLAN: Tina West is a 83 y.o. (15-Dec-1928) female  who was admitted with acute on chronic diastolic CHF and new onset afib with rvr, hypothyroidism and new toe wounds  -pt is poor historian but states that her wounds on her feet have been present since last December after a fall.  Son confirms this.  Her doppler signals in her feet are present in the AT/DP/PT bilaterally.  Santyl has been ordered -will order ABI's -pt on IV ancef  -pt on heparin for afib -Dr. Bridgett Larsson will be by to evaluate pt this afternoon.   Leontine Locket, PA-C [facility] [phone]   Addendum  I have independently interviewed and examined the  patient, and I agree with the physician assistant's findings.  Patient feet are consistent with significant recent swelling whether due to baseline CVI with CHF exacerbation.  Both feet appear well perfused.  After a period of foot elevation, both feet already look less cyanotic than  previous.  BLE ABI are pending.   Reportedly patient has been treating her toes after blistering with vinegar, so unclear if her lesions are acid burns due to the vinegar rather than malperfusion, especially as I can faintly feel pulses in both feet despite the swelling.  Elevated both feet.  Ok with using Santyl to debride the skin in the toes.  Once debrided, probably would switch over to Aquacel Ag dressing.  Once pt can tolerate would start compression on both feet with ACE wraps.  Might need ulna boots eventually.  Will check on patient after ABI completed.  No immediate interventions from Vascular Surgery needed.  Adele Barthel, MD, FACS [facility] [place] Office: [phone] Pager: [phone]  03/13/2017, 5:00 PM

## 2017-03-13 NOTE — Progress Notes (Signed)
Initial Nutrition Assessment  DOCUMENTATION CODES:   Severe malnutrition in context of social or environmental circumstances  INTERVENTION:   -Ensure Enlive po TID, each supplement provides 350 kcal and 20 grams of protein -MVI daily  NUTRITION DIAGNOSIS:   Severe Malnutrition related to social / environmental circumstances as evidenced by moderate fat depletion, severe fat depletion, moderate muscle depletion, severe muscle depletion, energy intake < 75% for > or equal to 1 month.  GOAL:   Patient will meet greater than or equal to 90% of their needs  MONITOR:   PO intake, Supplement acceptance, Labs, Weight trends, Skin, I & O's  REASON FOR ASSESSMENT:   Consult Wound healing  ASSESSMENT:   Tina West is a 83 y.o. female with medical history of A-fib, dCHF, hypothyroidism and anxiety who comes in for symptoms that have been going on for about 2 wks (started Dec 14th).   Pt admitted with new onset a-fib with RVR.   Spoke with pt and son at bedside. Both reports a general decline in health over the past year since experiencing a fall about the same time last year. Pt son reports that pt has had minimal intake over the past 2-3 weeks, which he suspects is related to poor respiratory status. Pt typically consumes 2 meals per day at baseline- meal usually consist of rice or canned green beans or canned potatoes or stew. Pt shares that she intermittently uses why protein powder and "muscle strength vitamins", however, pt has had difficulty obtained medications and medical care due to poor social situation. Per pt, she has been crawling around on the floor due to weakness.   Pt reports wt loss, however, unable to provide further details. Pt son also confirms wt loss, especially over the past few weeks. Noted pt has experienced 9.8% wt loss over the past 10 months, which while not significant for time frame, is concerning when coupled with poor oral intake and poor social  situation.   Pt reports eating a few bites of eggs and toast for breakfast. She endorses difficulty chewing due to jaw pain, however, reveals foods are "soft enough" for her. She politely declined offer of diet downgrade. Discussed importance of good meal and supplement intake to promote healing. Pt amenable to supplements.   Labs reviewed.   NUTRITION - FOCUSED PHYSICAL EXAM:    Most Recent Value  Orbital Region  Moderate depletion  Upper Arm Region  Severe depletion  Thoracic and Lumbar Region  Moderate depletion  Buccal Region  Severe depletion  Temple Region  Moderate depletion  Clavicle Bone Region  Moderate depletion  Clavicle and Acromion Bone Region  Severe depletion  Scapular Bone Region  Severe depletion  Dorsal Hand  Moderate depletion  Patellar Region  Mild depletion  Anterior Thigh Region  Mild depletion  Posterior Calf Region  Mild depletion  Edema (RD Assessment)  Moderate  Hair  Reviewed  Eyes  Reviewed  Mouth  Reviewed  Skin  Reviewed  Nails  Reviewed       Diet Order:  Diet 2 gram sodium Room service appropriate? Yes; Fluid consistency: Thin  EDUCATION NEEDS:   Education needs have been addressed  Skin:  Skin Assessment: Skin Integrity Issues: Skin Integrity Issues:: Other (Comment) Other: bilateral leg cellulitis  Last BM:  PTA  Height:   Ht Readings from Last 1 Encounters:  03/12/17 5\' 9"  (1.753 m)    Weight:   Wt Readings from Last 1 Encounters:  03/13/17 148 lb 13 oz (67.5  kg)    Ideal Body Weight:  65.9 kg  BMI:  Body mass index is 21.98 kg/m.  Estimated Nutritional Needs:   Kcal:  1650-1850  Protein:  80-95 grams  Fluid:  > 1.6 L    Jonea Bukowski A. Jimmye Norman, RD, LDN, CDE Pager: [phone] After hours Pager: [phone]

## 2017-03-14 ENCOUNTER — Inpatient Hospital Stay (HOSPITAL_COMMUNITY): Payer: Medicare PPO

## 2017-03-14 DIAGNOSIS — I10 Essential (primary) hypertension: Secondary | ICD-10-CM

## 2017-03-14 DIAGNOSIS — E43 Unspecified severe protein-calorie malnutrition: Secondary | ICD-10-CM

## 2017-03-14 DIAGNOSIS — I248 Other forms of acute ischemic heart disease: Secondary | ICD-10-CM

## 2017-03-14 DIAGNOSIS — L0390 Cellulitis, unspecified: Secondary | ICD-10-CM

## 2017-03-14 DIAGNOSIS — I5021 Acute systolic (congestive) heart failure: Secondary | ICD-10-CM

## 2017-03-14 LAB — COMPREHENSIVE METABOLIC PANEL
ALBUMIN: 3.2 g/dL — AB (ref 3.5–5.0)
ALT: 85 U/L — ABNORMAL HIGH (ref 14–54)
ANION GAP: 13 (ref 5–15)
AST: 68 U/L — ABNORMAL HIGH (ref 15–41)
Alkaline Phosphatase: 62 U/L (ref 38–126)
BUN: 31 mg/dL — ABNORMAL HIGH (ref 6–20)
CO2: 25 mmol/L (ref 22–32)
Calcium: 8.3 mg/dL — ABNORMAL LOW (ref 8.9–10.3)
Chloride: 96 mmol/L — ABNORMAL LOW (ref 101–111)
Creatinine, Ser: 0.99 mg/dL (ref 0.44–1.00)
GFR calc non Af Amer: 50 mL/min — ABNORMAL LOW (ref >60)
GFR, EST AFRICAN AMERICAN: 58 mL/min — AB (ref >60)
GLUCOSE: 96 mg/dL (ref 65–99)
POTASSIUM: 3.3 mmol/L — AB (ref 3.5–5.1)
SODIUM: 134 mmol/L — AB (ref 135–145)
Total Bilirubin: 1.1 mg/dL (ref 0.3–1.2)
Total Protein: 5.7 g/dL — ABNORMAL LOW (ref 6.5–8.1)

## 2017-03-14 LAB — HEPARIN LEVEL (UNFRACTIONATED)
HEPARIN UNFRACTIONATED: 0.19 [IU]/mL — AB (ref 0.30–0.70)
HEPARIN UNFRACTIONATED: 0.25 [IU]/mL — AB (ref 0.30–0.70)

## 2017-03-14 LAB — CBC
HCT: 37 % (ref 36.0–46.0)
Hemoglobin: 11.3 g/dL — ABNORMAL LOW (ref 12.0–15.0)
MCH: 25.3 pg — ABNORMAL LOW (ref 26.0–34.0)
MCHC: 30.5 g/dL (ref 30.0–36.0)
MCV: 83 fL (ref 78.0–100.0)
PLATELETS: 605 10*3/uL — AB (ref 150–400)
RBC: 4.46 MIL/uL (ref 3.87–5.11)
RDW: 15.7 % — AB (ref 11.5–15.5)
WBC: 9.6 10*3/uL (ref 4.0–10.5)

## 2017-03-14 LAB — LIPASE, BLOOD: Lipase: 33 U/L (ref 11–51)

## 2017-03-14 MED ORDER — ATORVASTATIN CALCIUM 20 MG PO TABS
20.0000 mg | ORAL_TABLET | Freq: Every day | ORAL | Status: DC
  Administered 2017-03-15 – 2017-03-26 (×12): 20 mg via ORAL
  Filled 2017-03-14 (×12): qty 1

## 2017-03-14 MED ORDER — CARVEDILOL 3.125 MG PO TABS
3.1250 mg | ORAL_TABLET | Freq: Two times a day (BID) | ORAL | Status: DC
  Administered 2017-03-14 – 2017-03-15 (×2): 3.125 mg via ORAL
  Filled 2017-03-14 (×2): qty 1

## 2017-03-14 MED ORDER — METOPROLOL TARTRATE 12.5 MG HALF TABLET: 12.5000 mg | ORAL_TABLET | Freq: Two times a day (BID) | ORAL | Status: DC

## 2017-03-14 NOTE — Progress Notes (Signed)
Nutrition Education Note  RD consulted for nutrition education regarding new onset CHF.  RD provided "Low Sodium Nutrition Therapy" handout from the Academy of [employer]. Reviewed patient's dietary recall. Provided examples on ways to decrease sodium intake in diet. Discouraged intake of processed foods and use of salt shaker. Encouraged fresh fruits and vegetables as well as whole grain sources of carbohydrates to maximize fiber intake.   RD discussed why it is important for patient to adhere to diet recommendations, and emphasized the role of fluids, foods to avoid, and importance of weighing self daily. Teach back method used.  Expect fair compliance.  Body mass index is 22.33 kg/m. Pt meets criteria for normal weight based on current BMI.  Current diet order is 2 gram sodium  RD is following this pt  Tina Distance MS, RD, LDN Pager #[phone] After Hours Pager: [phone]

## 2017-03-14 NOTE — Progress Notes (Signed)
PROGRESS NOTE  Tina West DGL:[id number] DOB: September 22, 1928 DOA: 03/12/2017 PCP: Claretta Fraise, MD  Brief Narrative: 88yow presented with LE edema, orthopnea, DOE. Admitted for (new) afib/RVR, elevated torponin,   Assessment/Plan Afib RVR, new onset with associated demand ischemia. CHA2DS2-VASc 5. - HR controlled on oral diltiazem. Will change to BB based on low EF. - ideally needs anticoagulation but financial limitations may limit NOAC use and non-compliance makes warfarin suboptimal. CM consulted.  Acute systolic CHF (new), acute on chronic diastolic CHF with anasarca and associated demand ischemia  - LE edema rapidly decreasing thought minus just .5 L. Not sure I/O accurate. Continue IV Lasix, check BMP in AM - new, profound systolic dysfunction on echo (last 2011). Tachy mediated? - add ACE-I if Entresto not planned. - add statin - further recs per cardiology--R/LHC?  Possible cellulitis left foot with ulcerations noted present on admission (loook like secondary to ruptured bulla but do suspect PAD). No evidence of acute limb ischemia. - appreciated VVS eval and recs; f/u ABI - change to Keflex and follow; total course 5 days  Hypothyroidism, TSH 32.9, off outpatient meds - continue levothyroxine @ 137 mcg  Essential HTN - remains stable  Thrombocytosis, chronic - f/u as an outpatient  Normocytic anemia.  On CT small amount of retroperitoneal edema inferior to the duodenum and pancreatic head. Duodenitis/peptic ulcer disease could cause this appearance. Groove pancreatitis would be another consideration. - significance unclear, patient asymptomatic - check LFTs and lipase today (ordered yesterday)  Aortic atherosclerosis  - statin  Severe malnutrition - supplements per dietician  DVT prophylaxis: heparin gtt Code Status: full Family Communication: son at bedside 12/29 Disposition Plan: home    Murray Hodgkins, MD  [facility] Direct contact:  [phone] --Via [place]  --[email or web address]; password [email or web address]  7PM-7AM contact night coverage as above 03/14/2017, 10:05 AM  LOS: 2 days   Consultants:   Cardiology   VVS   Procedures:  Echo Study Conclusions  - Left ventricle: The cavity size was mildly dilated. There was   moderate concentric hypertrophy. Systolic function was severely   reduced. The estimated ejection fraction was in the range of 15%   to 20%. Diffuse hypokinesis. The study was not technically   sufficient to allow evaluation of LV diastolic dysfunction due to   atrial fibrillation. - Ventricular septum: Septal motion showed paradox. - Aortic valve: Valve mobility was restricted. - Aortic root: The aortic root was normal in size. - Mitral valve: Calcified annulus. Mildly thickened leaflets .   There was mild regurgitation. - Left atrium: The atrium was moderately dilated. - Right ventricle: The cavity size was severely dilated. Wall   thickness was normal. Systolic function was moderately reduced. - Right atrium: The atrium was moderately dilated. - Tricuspid valve: There was moderate regurgitation. - Pulmonary arteries: Systolic pressure was moderately increased.   PA peak pressure: 54 mm Hg (S). - Inferior vena cava: The vessel was dilated. The respirophasic   diameter changes were blunted (< 50%), consistent with elevated   central venous pressure. - Pericardium, extracardiac: There was no pericardial effusion.  Antimicrobials:  Cefazolin 12/27 >>  Interval history/Subjective: Better, eating some, breathing better.  Objective: Vitals:  Vitals:   03/14/17 0331 03/14/17 0700  BP: 120/83   Pulse: 79   Resp: 16   Temp: 97.7 F (36.5 C) (!) 97.5 F (36.4 C)  SpO2: 100%     Exam:  Constitutional:   . Appears calm and comfortable Eyes:  . pupils and  irises appear normal . Normal lids ENMT:  . grossly normal hearing  Neck:  . no thyromegaly Respiratory:  . CTA bilaterally, no  w/r/r.  . Respiratory effort normal. Cardiovascular:  . RRR, no m/r/g . 1+ BLE extremity edema   Abdomen:  . Abdomen appears normal; no tenderness or masses . No hernias . No HSM Skin:  . Bilateral feet rubor Psychiatric:  . Mental status o Mood, affect appropriate  I have personally reviewed the following:  Filed Weights   03/12/17 2047 03/13/17 0323 03/14/17 0331  Weight: 67.5 kg (148 lb 13 oz) 67.5 kg (148 lb 13 oz) 68.6 kg (151 lb 3.8 oz)   Weight change: -5.79 kg (-12.2 oz)  Labs:  BMP last evening noted, creatinine upt to 1.09, Mg WNL, K+ 3.9  Scheduled Meds: . calcium carbonate  1 tablet Oral TID WC  . collagenase   Topical Daily  . diltiazem  60 mg Oral Q8H  . feeding supplement (ENSURE ENLIVE)  237 mL Oral TID BM  . furosemide  40 mg Intravenous BID  . Influenza vac split quadrivalent PF  0.5 mL Intramuscular Tomorrow-1000  . levothyroxine  150 mcg Oral QAC breakfast  . multivitamin with minerals  1 tablet Oral Daily  . pneumococcal 23 valent vaccine  0.5 mL Intramuscular Tomorrow-1000  . sodium chloride flush  3 mL Intravenous Q12H   Continuous Infusions: . sodium chloride    .  ceFAZolin (ANCEF) IV Stopped (03/14/17 0831)  . heparin 1,000 Units/hr (03/13/17 1610)    Principal Problem:   A-fib ([place]) Active Problems:   Essential hypertension   Left leg cellulitis   Hypothyroidism   Demand ischemia (HCC)   Acute on chronic diastolic CHF (congestive heart failure) (HCC)   Thrombocytosis (HCC)   Protein-calorie malnutrition, severe   LOS: 2 days

## 2017-03-14 NOTE — Progress Notes (Signed)
VASCULAR LAB PRELIMINARY  ARTERIAL  ABI completed:    RIGHT    LEFT    PRESSURE WAVEFORM  PRESSURE WAVEFORM  BRACHIAL 115 T BRACHIAL  T  DP   DP    AT 71 DM AT >300 DM  PT >300 DM PT >300 DM  PER   PER    GREAT TOE 106 NA GREAT TOE 59 NA    RIGHT LEFT  ABI Non Compressible Non Compressible     Daya Dutt, RVT 03/14/2017, 11:12 AM

## 2017-03-14 NOTE — Progress Notes (Signed)
Pt transported to MRI via ventilator with no complications noted. Pt returned to 2M05.

## 2017-03-14 NOTE — Progress Notes (Signed)
New pt transferred to the unit from 2c. Pt brought to the floor in stable condition. Vitals taken. Initial Assessment done. All immediate pertinent needs to patient addressed. Patient Guide given to patient. Important safety instructions relating to hospitalization reviewed with patient. Patient verbalized understanding. Pt has a dressing on her BL lower leg, pt is in IV heparin continue @12cc /hr. Will continue to monitor pt.

## 2017-03-14 NOTE — Progress Notes (Addendum)
   Daily Progress Note  ABI read.  Bilateral moderate peripheral disease with non-compressible tibial arteries.  [facility] Cardiology note reviewed.  - Pt is NOT a surgical candidate at this point with acute changes in cardiac function - Cardiology recommends cardiac catheterization so would hold off on peripheral angiography to avoid compounding any contrast induced nephropathy - At this point, I would manage the feet with wound care:  Santyl as currently ordered  Compression once necrotic skin debrided  Would get Dr. Jess Barters opinion once he is back in town given his silver impregnated compression stockings - Reconsult once cardiac status stable    Adele Barthel, MD, FACS [facility]: [phone] Pager: [phone]  03/14/2017, 3:44 PM

## 2017-03-14 NOTE — Progress Notes (Signed)
[place] for heparin  Indication: atrial fibrillation  Allergies  Allergen Reactions  . Codeine Hives  . Contrast Media [Iodinated Diagnostic Agents] Other (See Comments)    unspecified  . Diazepam Hives  . Meperidine Hcl Other (See Comments)    unspecified  . Morphine Other (See Comments)    "caused heart attack"    Patient Measurements: Height: 5\' 9"  (175.3 cm) Weight: 151 lb 3.8 oz (68.6 kg) IBW/kg (Calculated) : 66.2   Vital Signs: Temp: 98.1 F (36.7 C) (12/29 2131) Temp Source: Oral (12/29 2131) BP: 139/77 (12/29 2131) Pulse Rate: 134 (12/29 2131)  Labs: Recent Labs    03/12/17 1130  03/12/17 1936 03/12/17 2335  03/13/17 0341 03/13/17 0731 03/13/17 1133 03/13/17 1737 03/14/17 1108 03/14/17 2037  HGB 11.5*  --   --   --   --  10.7*  --   --   --  11.3*  --   HCT 36.6  --   --   --   --  33.8*  --   --   --  37.0  --   PLT 717*  --   --   --   --  666*  --   --   --  605*  --   HEPARINUNFRC  --   --   --   --    < > 0.56  --  0.31  --  0.19* 0.25*  CREATININE 1.04*  --   --   --   --  0.96  --   --  1.09* 0.99  --   TROPONINI  --    < > 0.29* 0.25*  --   --  0.27*  --   --   --   --    < > = values in this interval not displayed.    Estimated Creatinine Clearance: 42.6 mL/min (by C-G formula based on SCr of 0.99 mg/dL).    Assessment: 58 yoF on heparin for AFib with plans for DOAC after LHC this week. Heparin level low at 0.25   Goal of Therapy:  Heparin level 0.3-0.7 units/ml Monitor platelets by anticoagulation protocol: Yes   Plan:  -Increase heparin to 1350 units/hr -Daily heparin level, CBC, S/Sx bleeding  Thank you Anette Guarneri, PharmD [phone] 03/14/2017

## 2017-03-14 NOTE — Progress Notes (Signed)
[place] for heparin  Indication: atrial fibrillation  Allergies  Allergen Reactions  . Codeine Hives  . Contrast Media [Iodinated Diagnostic Agents] Other (See Comments)    unspecified  . Diazepam Hives  . Meperidine Hcl Other (See Comments)    unspecified  . Morphine Other (See Comments)    "caused heart attack"    Patient Measurements: Height: 5\' 9"  (175.3 cm) Weight: 151 lb 3.8 oz (68.6 kg) IBW/kg (Calculated) : 66.2   Vital Signs: Temp: 97.5 F (36.4 C) (12/29 0700) Temp Source: Oral (12/29 0700) BP: 115/90 (12/29 1000) Pulse Rate: 121 (12/29 1000)  Labs: Recent Labs    03/12/17 1130  03/12/17 1936 03/12/17 2335 03/13/17 0341 03/13/17 0731 03/13/17 1133 03/13/17 1737 03/14/17 1108  HGB 11.5*  --   --   --  10.7*  --   --   --  11.3*  HCT 36.6  --   --   --  33.8*  --   --   --  37.0  PLT 717*  --   --   --  666*  --   --   --  605*  HEPARINUNFRC  --   --   --   --  0.56  --  0.31  --  0.19*  CREATININE 1.04*  --   --   --  0.96  --   --  1.09* 0.99  TROPONINI  --    < > 0.29* 0.25*  --  0.27*  --   --   --    < > = values in this interval not displayed.    Estimated Creatinine Clearance: 42.6 mL/min (by C-G formula based on SCr of 0.99 mg/dL).   Medical History: Past Medical History:  Diagnosis Date  . A-fib ([place])   . Abdominal cyst    Multiple surgeries in [place]. "Heart Stopped" on the operating table during one of her "cyst" operations, probably 15-20 years ago  . Anxiety   . Diastolic CHF, acute (HCC)    BNP 179 in 1/11. Echo (1/11) with EF 60%, mild focal basal septal hypertrophy, grade I diastolic dysfunction, mild left atrial enlargement.   [place] Kitchen HTN (hypertension)   . Hypothyroidism   . Obesity   . S/P TAH (total abdominal hysterectomy)     Medications:  Medications Prior to Admission  Medication Sig Dispense Refill Last Dose  . acetaminophen (TYLENOL) 325 MG tablet Take 650 mg by mouth every 6  (six) hours as needed for mild pain.   Past Month at Unknown time  . ALPRAZolam (XANAX) 0.25 MG tablet Take 1 tablet (0.25 mg total) by mouth 2 (two) times daily. (Patient taking differently: Take 0.5 mg by mouth daily as needed for anxiety. ) 60 tablet 2 03/11/2017 at Unknown time  . aspirin (ASPIR-LOW) 81 MG EC tablet Take 81 mg by mouth daily.     03/11/2017 at Unknown time  . DM-Doxylamine-Acetaminophen (NYQUIL COLD & FLU PO) Take 15 mLs by mouth at bedtime as needed (cough).   Past Week at Unknown time  . ibuprofen (ADVIL,MOTRIN) 200 MG tablet Take 200 mg by mouth every 6 (six) hours as needed for moderate pain.   unk at unk  . benazepril-hydrochlorthiazide (LOTENSIN HCT) 20-12.5 MG per tablet Take 1 tablet by mouth 2 (two) times daily. (Patient not taking: Reported on 03/12/2017) 60 tablet 11 Not Taking at Unknown time  . donepezil (ARICEPT) 10 MG tablet Take 0.5 tablets (5 mg total) by  mouth at bedtime. (Patient not taking: Reported on 03/12/2017) 30 tablet 5 Not Taking at Unknown time  . furosemide (LASIX) 40 MG tablet TAKE 1 TABLET (40 MG TOTAL) BY MOUTH DAILY. (Patient not taking: Reported on 03/12/2017) 30 tablet 5 Not Taking at Unknown time  . levothyroxine (SYNTHROID, LEVOTHROID) 137 MCG tablet Take 1 tablet (137 mcg total) by mouth daily. (Patient not taking: Reported on 03/12/2017) 30 tablet 5 Not Taking at Unknown time  . metoprolol succinate (TOPROL-XL) 100 MG 24 hr tablet Take 1 tablet (100 mg total) by mouth daily. (Patient not taking: Reported on 03/12/2017) 30 tablet 5 Not Taking at Unknown time   Scheduled:  . [START ON 03/15/2017] atorvastatin  20 mg Oral q1800  . calcium carbonate  1 tablet Oral TID WC  . carvedilol  3.125 mg Oral BID WC  . collagenase   Topical Daily  . feeding supplement (ENSURE ENLIVE)  237 mL Oral TID BM  . furosemide  40 mg Intravenous BID  . Influenza vac split quadrivalent PF  0.5 mL Intramuscular Tomorrow-1000  . levothyroxine  150 mcg Oral QAC  breakfast  . multivitamin with minerals  1 tablet Oral Daily  . pneumococcal 23 valent vaccine  0.5 mL Intramuscular Tomorrow-1000  . sodium chloride flush  3 mL Intravenous Q12H    Assessment: 88 yoF on heparin for AFib with plans for DOAC after LHC this week. Heparin level subtherapeutic at 0.19 this morning, no issues noted by RN.  Goal of Therapy:  Heparin level 0.3-0.7 units/ml Monitor platelets by anticoagulation protocol: Yes   Plan:  -Increase heparin to 1200 units/hr -Recheck 8-hr heparin level -Daily heparin level, CBC, S/Sx bleeding  Arrie Senate, PharmD, BCPS PGY-2 Cardiology Pharmacy Resident Pager: [phone] 03/14/2017

## 2017-03-14 NOTE — Progress Notes (Signed)
DAILY PROGRESS NOTE   Patient Name: Tina West Date of Encounter: 03/14/2017  Chief Complaint   Short of breath  Patient Profile   83 y.o. female w/ PMH of chronic diastolic CHF, HTN, hypothyroidism, and anxiety who presented to [facility] ED on 03/12/2017 for evaluation of worsening edema, orthopnea, and lower extremity edema in the setting of medication noncompliance secondary to financial issues.   Subjective   Echo yesterday shows new cardiomyopathy - LVEF 15-20% with global hypokinesis and elevated left and right heart pressures with RVSP of 54 mmHg. Now rate controlled on oral diltiazem - switched to BB this am for cardiomyopathy. Diuresed about 500 cc negative yesterday. Weight up to 151 from 148.   Objective   Vitals:   03/14/17 0331 03/14/17 0700 03/14/17 0800 03/14/17 1000  BP: 120/83  105/80 115/90  Pulse: 79  (!) 46 (!) 121  Resp: _0 Temp: 97.7 F (36.5 C) (!) 97.5 F (36.4 C)    TempSrc: Axillary Oral    SpO2: 100%  98% 100%  Weight: 151 lb 3.8 oz (68.6 kg)     Height:        Intake/Output Summary (Last 24 hours) at 03/14/2017 1114 Last data filed at 03/14/2017 1540 Gross per 24 hour  Intake 233.67 ml  Output 1750 ml  Net -1516.33 ml   Filed Weights   03/12/17 2047 03/13/17 0323 03/14/17 0331  Weight: 148 lb 13 oz (67.5 kg) 148 lb 13 oz (67.5 kg) 151 lb 3.8 oz (68.6 kg)    Physical Exam   General appearance: alert, cachectic, no distress and pale Neck: JVD - 5 cm above sternal notch, no carotid bruit and thyroid not enlarged, symmetric, no tenderness/mass/nodules Lungs: diminished breath sounds bilaterally Heart: irregularly irregular rhythm Abdomen: soft, non-tender; bowel sounds normal; no masses,  no organomegaly Extremities: extremities normal, atraumatic, no cyanosis or edema Pulses: 2+ and symmetric Skin: Skin color, texture, turgor normal. No rashes or lesions Neurologic: Mental status: awake, mildly confused Psych:  Pleasant  Inpatient Medications    Scheduled Meds: . [START ON 03/15/2017] atorvastatin  20 mg Oral q1800  . calcium carbonate  1 tablet Oral TID WC  . collagenase   Topical Daily  . feeding supplement (ENSURE ENLIVE)  237 mL Oral TID BM  . furosemide  40 mg Intravenous BID  . Influenza vac split quadrivalent PF  0.5 mL Intramuscular Tomorrow-1000  . levothyroxine  150 mcg Oral QAC breakfast  . metoprolol tartrate  12.5 mg Oral BID  . multivitamin with minerals  1 tablet Oral Daily  . pneumococcal 23 valent vaccine  0.5 mL Intramuscular Tomorrow-1000  . sodium chloride flush  3 mL Intravenous Q12H    Continuous Infusions: . sodium chloride    .  ceFAZolin (ANCEF) IV Stopped (03/14/17 0831)  . heparin 1,000 Units/hr (03/13/17 1610)    PRN Meds: sodium chloride, acetaminophen **OR** acetaminophen, ALPRAZolam, alum & mag hydroxide-simeth, ondansetron **OR** ondansetron (ZOFRAN) IV, sodium chloride flush   Labs   Results for orders placed or performed during the hospital encounter of 03/12/17 (from the past 48 hour(s))  Basic metabolic panel     Status: Abnormal   Collection Time: 03/12/17 11:30 AM  Result Value Ref Range   Sodium 135 135 - 145 mmol/L   Potassium 4.0 3.5 - 5.1 mmol/L   Chloride 98 (L) 101 - 111 mmol/L   CO2 21 (L) 22 - 32 mmol/L   Glucose, Bld 141 (H) 65 -  99 mg/dL   BUN 27 (H) 6 - 20 mg/dL   Creatinine, Ser 1.04 (H) 0.44 - 1.00 mg/dL   Calcium 10.1 8.9 - 10.3 mg/dL   GFR calc non Af Amer 47 (L) >60 mL/min   GFR calc Af Amer 55 (L) >60 mL/min    Comment: (NOTE) The eGFR has been calculated using the CKD EPI equation. This calculation has not been validated in all clinical situations. eGFR's persistently <60 mL/min signify possible Chronic Kidney Disease.    Anion gap 16 (H) 5 - 15  CBC     Status: Abnormal   Collection Time: 03/12/17 11:30 AM  Result Value Ref Range   WBC 10.1 4.0 - 10.5 K/uL   RBC 4.50 3.87 - 5.11 MIL/uL   Hemoglobin 11.5 (L) 12.0  - 15.0 g/dL   HCT 36.6 36.0 - 46.0 %   MCV 81.3 78.0 - 100.0 fL   MCH 25.6 (L) 26.0 - 34.0 pg   MCHC 31.4 30.0 - 36.0 g/dL   RDW 15.4 11.5 - 15.5 %   Platelets 717 (H) 150 - 400 K/uL  I-stat troponin, ED     Status: Abnormal   Collection Time: 03/12/17 11:48 AM  Result Value Ref Range   Troponin i, poc 0.31 (HH) 0.00 - 0.08 ng/mL   Comment NOTIFIED PHYSICIAN    Comment 3            Comment: Due to the release kinetics of cTnI, a negative result within the first hours of the onset of symptoms does not rule out myocardial infarction with certainty. If myocardial infarction is still suspected, repeat the test at appropriate intervals.   Blood culture (routine x 2)     Status: None (Preliminary result)   Collection Time: 03/12/17  1:00 PM  Result Value Ref Range   Specimen Description BLOOD LEFT ANTECUBITAL    Special Requests      IN BOTH AEROBIC AND ANAEROBIC BOTTLES Blood Culture adequate volume   Culture NO GROWTH 1 DAY    Report Status PENDING   Blood culture (routine x 2)     Status: None (Preliminary result)   Collection Time: 03/12/17  1:00 PM  Result Value Ref Range   Specimen Description BLOOD RIGHT ANTECUBITAL    Special Requests      IN BOTH AEROBIC AND ANAEROBIC BOTTLES Blood Culture results may not be optimal due to an inadequate volume of blood received in culture bottles   Culture NO GROWTH 1 DAY    Report Status PENDING   I-Stat CG4 Lactic Acid, ED     Status: Abnormal   Collection Time: 03/12/17  1:04 PM  Result Value Ref Range   Lactic Acid, Venous 3.19 (HH) 0.5 - 1.9 mmol/L   Comment NOTIFIED PHYSICIAN   Urine culture     Status: None   Collection Time: 03/12/17  1:38 PM  Result Value Ref Range   Specimen Description URINE, CLEAN CATCH    Special Requests NONE    Culture NO GROWTH    Report Status 03/13/2017 FINAL   Urinalysis, Routine w reflex microscopic     Status: Abnormal   Collection Time: 03/12/17  1:38 PM  Result Value Ref Range   Color,  Urine AMBER (A) YELLOW    Comment: BIOCHEMICALS MAY BE AFFECTED BY COLOR   APPearance CLEAR CLEAR   Specific Gravity, Urine 1.026 1.005 - 1.030   pH 5.0 5.0 - 8.0   Glucose, UA 50 (A) NEGATIVE mg/dL  Hgb urine dipstick NEGATIVE NEGATIVE   Bilirubin Urine NEGATIVE NEGATIVE   Ketones, ur NEGATIVE NEGATIVE mg/dL   Protein, ur 100 (A) NEGATIVE mg/dL   Nitrite NEGATIVE NEGATIVE   Leukocytes, UA NEGATIVE NEGATIVE   RBC / HPF 0-5 0 - 5 RBC/hpf   WBC, UA 0-5 0 - 5 WBC/hpf   Bacteria, UA RARE (A) NONE SEEN   Squamous Epithelial / LPF 0-5 (A) NONE SEEN   Mucus PRESENT    Hyaline Casts, UA PRESENT   I-Stat CG4 Lactic Acid, ED     Status: Abnormal   Collection Time: 03/12/17  3:17 PM  Result Value Ref Range   Lactic Acid, Venous 2.38 (HH) 0.5 - 1.9 mmol/L   Comment NOTIFIED PHYSICIAN   Troponin I     Status: Abnormal   Collection Time: 03/12/17  3:24 PM  Result Value Ref Range   Troponin I 0.29 (HH) <0.03 ng/mL    Comment: CRITICAL RESULT CALLED TO, READ BACK BY AND VERIFIED WITH: S WOODY,RN 1631 03/12/17 D BRADLEY   TSH     Status: Abnormal   Collection Time: 03/12/17  4:45 PM  Result Value Ref Range   TSH 32.904 (H) 0.350 - 4.500 uIU/mL    Comment: Performed by a 3rd Generation assay with a functional sensitivity of <=0.01 uIU/mL.  Troponin I (q 6hr x 3)     Status: Abnormal   Collection Time: 03/12/17  7:36 PM  Result Value Ref Range   Troponin I 0.29 (HH) <0.03 ng/mL    Comment: CRITICAL VALUE NOTED.  VALUE IS CONSISTENT WITH PREVIOUSLY REPORTED AND CALLED VALUE.  Brain natriuretic peptide     Status: Abnormal   Collection Time: 03/12/17 11:35 PM  Result Value Ref Range   B Natriuretic Peptide 1,846.7 (H) 0.0 - 100.0 pg/mL  Troponin I (q 6hr x 3)     Status: Abnormal   Collection Time: 03/12/17 11:35 PM  Result Value Ref Range   Troponin I 0.25 (HH) <0.03 ng/mL    Comment: CRITICAL VALUE NOTED.  VALUE IS CONSISTENT WITH PREVIOUSLY REPORTED AND CALLED VALUE.  Lactic acid,  plasma     Status: Abnormal   Collection Time: 03/12/17 11:35 PM  Result Value Ref Range   Lactic Acid, Venous 2.2 (HH) 0.5 - 1.9 mmol/L    Comment: CRITICAL RESULT CALLED TO, READ BACK BY AND VERIFIED WITH: BRIERS,B RN 03/13/2017 0114 JORDANS   MRSA PCR Screening     Status: None   Collection Time: 03/13/17  1:59 AM  Result Value Ref Range   MRSA by PCR NEGATIVE NEGATIVE    Comment:        The GeneXpert MRSA Assay (FDA approved for NASAL specimens only), is one component of a comprehensive MRSA colonization surveillance program. It is not intended to diagnose MRSA infection nor to guide or monitor treatment for MRSA infections.   Hemoglobin A1c     Status: None   Collection Time: 03/13/17  3:41 AM  Result Value Ref Range   Hgb A1c MFr Bld 5.2 4.8 - 5.6 %    Comment: (NOTE) Pre diabetes:          5.7%-6.4% Diabetes:              >6.4% Glycemic control for   <7.0% adults with diabetes    Mean Plasma Glucose 102.54 mg/dL  Basic metabolic panel     Status: Abnormal   Collection Time: 03/13/17  3:41 AM  Result Value Ref Range  Sodium 135 135 - 145 mmol/L   Potassium 3.9 3.5 - 5.1 mmol/L   Chloride 100 (L) 101 - 111 mmol/L   CO2 22 22 - 32 mmol/L   Glucose, Bld 107 (H) 65 - 99 mg/dL   BUN 28 (H) 6 - 20 mg/dL   Creatinine, Ser 0.96 0.44 - 1.00 mg/dL   Calcium 8.9 8.9 - 10.3 mg/dL   GFR calc non Af Amer 52 (L) >60 mL/min   GFR calc Af Amer >60 >60 mL/min    Comment: (NOTE) The eGFR has been calculated using the CKD EPI equation. This calculation has not been validated in all clinical situations. eGFR's persistently <60 mL/min signify possible Chronic Kidney Disease.    Anion gap 13 5 - 15  Lactic acid, plasma     Status: None   Collection Time: 03/13/17  3:41 AM  Result Value Ref Range   Lactic Acid, Venous 1.7 0.5 - 1.9 mmol/L  Heparin level (unfractionated)     Status: None   Collection Time: 03/13/17  3:41 AM  Result Value Ref Range   Heparin Unfractionated  0.56 0.30 - 0.70 IU/mL    Comment:        IF HEPARIN RESULTS ARE BELOW EXPECTED VALUES, AND PATIENT DOSAGE HAS BEEN CONFIRMED, SUGGEST FOLLOW UP TESTING OF ANTITHROMBIN III LEVELS.   CBC     Status: Abnormal   Collection Time: 03/13/17  3:41 AM  Result Value Ref Range   WBC 11.1 (H) 4.0 - 10.5 K/uL   RBC 4.14 3.87 - 5.11 MIL/uL   Hemoglobin 10.7 (L) 12.0 - 15.0 g/dL   HCT 33.8 (L) 36.0 - 46.0 %   MCV 81.6 78.0 - 100.0 fL   MCH 25.8 (L) 26.0 - 34.0 pg   MCHC 31.7 30.0 - 36.0 g/dL   RDW 15.9 (H) 11.5 - 15.5 %   Platelets 666 (H) 150 - 400 K/uL  Troponin I (q 6hr x 3)     Status: Abnormal   Collection Time: 03/13/17  7:31 AM  Result Value Ref Range   Troponin I 0.27 (HH) <0.03 ng/mL    Comment: CRITICAL VALUE NOTED.  VALUE IS CONSISTENT WITH PREVIOUSLY REPORTED AND CALLED VALUE.  Heparin level (unfractionated)     Status: None   Collection Time: 03/13/17 11:33 AM  Result Value Ref Range   Heparin Unfractionated 0.31 0.30 - 0.70 IU/mL    Comment:        IF HEPARIN RESULTS ARE BELOW EXPECTED VALUES, AND PATIENT DOSAGE HAS BEEN CONFIRMED, SUGGEST FOLLOW UP TESTING OF ANTITHROMBIN III LEVELS.   C-reactive protein     Status: Abnormal   Collection Time: 03/13/17 11:33 AM  Result Value Ref Range   CRP 1.0 (H) <1.0 mg/dL  Prealbumin     Status: Abnormal   Collection Time: 03/13/17 11:33 AM  Result Value Ref Range   Prealbumin 16.2 (L) 18 - 38 mg/dL  Sedimentation rate     Status: None   Collection Time: 03/13/17 11:33 AM  Result Value Ref Range   Sed Rate 0 0 - 22 mm/hr  Basic metabolic panel     Status: Abnormal   Collection Time: 03/13/17  5:37 PM  Result Value Ref Range   Sodium 133 (L) 135 - 145 mmol/L   Potassium 3.9 3.5 - 5.1 mmol/L   Chloride 99 (L) 101 - 111 mmol/L   CO2 22 22 - 32 mmol/L   Glucose, Bld 154 (H) 65 - 99 mg/dL   BUN  37 (H) 6 - 20 mg/dL   Creatinine, Ser 1.09 (H) 0.44 - 1.00 mg/dL   Calcium 8.9 8.9 - 10.3 mg/dL   GFR calc non Af Amer 45 (L) >60  mL/min   GFR calc Af Amer 52 (L) >60 mL/min    Comment: (NOTE) The eGFR has been calculated using the CKD EPI equation. This calculation has not been validated in all clinical situations. eGFR's persistently <60 mL/min signify possible Chronic Kidney Disease.    Anion gap 12 5 - 15  Magnesium     Status: None   Collection Time: 03/13/17  5:37 PM  Result Value Ref Range   Magnesium 1.8 1.7 - 2.4 mg/dL    ECG   A-fib - Personally Reviewed  Telemetry   A-fib with CVR - Personally Reviewed  Radiology    Ct Abdomen Pelvis Wo Contrast  Result Date: 03/12/2017 CLINICAL DATA:  Shortness of breath for several weeks. Upper abdominal pain. EXAM: CT ABDOMEN AND PELVIS WITHOUT CONTRAST TECHNIQUE: Multidetector CT imaging of the abdomen and pelvis was performed following the standard protocol without IV contrast. COMPARISON:  None. FINDINGS: Motion degraded study. Lower chest: Small to moderate bilateral pleural effusions with bibasilar collapse/ consolidation. The heart is enlarged. Coronary artery calcification is evident. Ground-glass attenuation in the lower lungs suggests edema. Hepatobiliary: No focal abnormality in the liver on this study without intravenous contrast. Gallbladder surgically absent. No intrahepatic or extrahepatic biliary dilation. Pancreas: No focal mass lesion. No dilatation of the main duct. No intraparenchymal cyst. No peripancreatic edema. Spleen: No splenomegaly. No focal mass lesion. Adrenals/Urinary Tract: No adrenal nodule or mass. Kidneys are unremarkable. No evidence for hydroureter. Bladder decompressed. Stomach/Bowel: Stomach is nondistended. No gastric wall thickening. No evidence of outlet obstruction. Distal stomach and proximal duodenum not well characterized due to the motion artifact. No small bowel wall thickening. No small bowel dilatation. Terminal ileum not well seen. The appendix is not visualized, but there is no edema or inflammation in the region of the  cecum. Diverticular changes are noted in the left colon without evidence of diverticulitis. Vascular/Lymphatic: There is abdominal aortic atherosclerosis without aneurysm. There is no gastrohepatic or hepatoduodenal ligament lymphadenopathy. No intraperitoneal or retroperitoneal lymphadenopathy. No pelvic sidewall lymphadenopathy. Reproductive: Uterus surgically absent.  There is no adnexal mass. Other: Apparent edema/ fluid in the retroperitoneum of the right abdomen, tracking in the anterior pararenal space inferior to the duodenum and down along the right psoas muscle. Musculoskeletal: Diffuse body wall edema bones are diffusely demineralized age indeterminate L2 compression fracture noted. IMPRESSION: 1. Study is limited by lack of intravenous contrast and patient motion. There appears to be a small amount of retroperitoneal edema inferior to the duodenum and pancreatic head. Duodenitis/peptic ulcer disease could cause this appearance. Groove pancreatitis would be another consideration. Gallbladder is surgically absent. 2. Diffuse body wall edema. 3. Small the moderate bilateral pleural effusions with diffuse ground-glass attenuation in the lower lungs suggesting edema. 4.  Aortic Atherosclerois (ICD10-170.0) 5. Colonic diverticulosis without definite findings of diverticulitis. Electronically Signed   By: Misty Stanley M.D.   On: 03/12/2017 14:17   Dg Chest 2 View  Result Date: 03/12/2017 CLINICAL DATA:  83 year old female with a history of shortness of breath an weakness EXAM: CHEST  2 VIEW COMPARISON:  None. FINDINGS: Cardiomediastinal silhouette enlarged. Interlobular septal thickening. Low lung volumes with apical kyphotic positioning secondary to kyphotic curvature of the spine. Lateral view demonstrates small pleural effusions. IMPRESSION: Small pleural effusions with evidence of mild edema.  Electronically Signed   By: Corrie Mckusick D.O.   On: 03/12/2017 12:32   Dg Foot Complete Left  Result  Date: 03/12/2017 CLINICAL DATA:  Left foot necrosis EXAM: LEFT FOOT - COMPLETE 3+ VIEW COMPARISON:  None. FINDINGS: Diffuse osteopenia limits the exam. Mild degenerative changes at the DIP and PIP joints. Mild degenerative change at the first MTP joint. No obvious fracture. Vascular calcifications. Heterogeneous lucency in the calcaneus bone, possibly due to osteopenia. Dorsal soft tissue swelling. No soft tissue gas. IMPRESSION: Diffuse osteopenia limits the examination. No definite acute osseous abnormality is seen. Heterogeneous lucencies in the calcaneus and metatarsals could be secondary to demineralization Electronically Signed   By: Donavan Foil M.D.   On: 03/12/2017 15:14    Cardiac Studies   LV EF: 15% -   20%  ------------------------------------------------------------------- Indications:      Atrial fibrillation - 427.31.  ------------------------------------------------------------------- History:   PMH:   Atrial fibrillation.  Congestive heart failure.   ------------------------------------------------------------------- Study Conclusions  - Left ventricle: The cavity size was mildly dilated. There was   moderate concentric hypertrophy. Systolic function was severely   reduced. The estimated ejection fraction was in the range of 15%   to 20%. Diffuse hypokinesis. The study was not technically   sufficient to allow evaluation of LV diastolic dysfunction due to   atrial fibrillation. - Ventricular septum: Septal motion showed paradox. - Aortic valve: Valve mobility was restricted. - Aortic root: The aortic root was normal in size. - Mitral valve: Calcified annulus. Mildly thickened leaflets .   There was mild regurgitation. - Left atrium: The atrium was moderately dilated. - Right ventricle: The cavity size was severely dilated. Wall   thickness was normal. Systolic function was moderately reduced. - Right atrium: The atrium was moderately dilated. - Tricuspid valve:  There was moderate regurgitation. - Pulmonary arteries: Systolic pressure was moderately increased.   PA peak pressure: 54 mm Hg (S). - Inferior vena cava: The vessel was dilated. The respirophasic   diameter changes were blunted (< 50%), consistent with elevated   central venous pressure. - Pericardium, extracardiac: There was no pericardial effusion.  Assessment   1. Principal Problem: 2.   A-fib ([place]) 3. Active Problems: 4.   Essential hypertension 5.   Left leg cellulitis 6.   Hypothyroidism 7.   Demand ischemia ([place]) 8.   Acute on chronic diastolic CHF (congestive heart failure) ([place]) 9.   Thrombocytosis ([place]) 10.   Protein-calorie malnutrition, severe 11.   Acute systolic CHF (congestive heart failure) ([place]) 12.   Plan   1. New cardiomyopathy with LVEF 15-20%. Agree with plan to switch CCB over to BB- BP may not allow addition of ACE-I/ARB at this point. Etiology is not clear, but likely related to hypertension and/or tachy-mediated with a-fib. Cannot exclude CAD. Would recommend R/LHC this week. Keep on heparin for now and consider transition to [place] after cath. She has medicare/[employer], so we need to address what other barriers to compliance she has - transportation is an issue for her. Would continue diuresis.  Time Spent Directly with Patient:  I have spent a total of 35 minutes with the patient reviewing hospital notes, telemetry, EKGs, labs and examining the patient as well as establishing an assessment and plan that was discussed personally with the patient. > 50% of time was spent in direct patient care.  Length of Stay:  LOS: 2 days   Pixie Casino, MD, [facility], [place]  Medical Director of the [facility] Attending Cardiologist  Direct Dial: [phone]  Fax: [phone]  Website:  [email or web address]  03/14/2017, 11:14 AM

## 2017-03-14 NOTE — Progress Notes (Signed)
IV site found infiltrated, IV team consulted to reevaluation. IV heparin stopped in [facility]

## 2017-03-15 LAB — CBC
HEMATOCRIT: 36.7 % (ref 36.0–46.0)
Hemoglobin: 11.2 g/dL — ABNORMAL LOW (ref 12.0–15.0)
MCH: 25.5 pg — ABNORMAL LOW (ref 26.0–34.0)
MCHC: 30.5 g/dL (ref 30.0–36.0)
MCV: 83.4 fL (ref 78.0–100.0)
Platelets: 660 10*3/uL — ABNORMAL HIGH (ref 150–400)
RBC: 4.4 MIL/uL (ref 3.87–5.11)
RDW: 15.9 % — AB (ref 11.5–15.5)
WBC: 12.5 10*3/uL — AB (ref 4.0–10.5)

## 2017-03-15 LAB — BASIC METABOLIC PANEL
ANION GAP: 13 (ref 5–15)
BUN: 39 mg/dL — ABNORMAL HIGH (ref 6–20)
CALCIUM: 8.9 mg/dL (ref 8.9–10.3)
CHLORIDE: 92 mmol/L — AB (ref 101–111)
CO2: 26 mmol/L (ref 22–32)
Creatinine, Ser: 0.97 mg/dL (ref 0.44–1.00)
GFR calc Af Amer: 60 mL/min — ABNORMAL LOW (ref >60)
GFR calc non Af Amer: 51 mL/min — ABNORMAL LOW (ref >60)
GLUCOSE: 151 mg/dL — AB (ref 65–99)
Potassium: 3.9 mmol/L (ref 3.5–5.1)
Sodium: 131 mmol/L — ABNORMAL LOW (ref 135–145)

## 2017-03-15 LAB — LIPID PANEL
Cholesterol: 164 mg/dL (ref 0–200)
HDL: 68 mg/dL (ref >40)
LDL CALC: 90 mg/dL (ref 0–99)
Total CHOL/HDL Ratio: 2.4 RATIO
Triglycerides: 30 mg/dL (ref <150)
VLDL: 6 mg/dL (ref 0–40)

## 2017-03-15 LAB — HEPARIN LEVEL (UNFRACTIONATED): HEPARIN UNFRACTIONATED: 0.43 [IU]/mL (ref 0.30–0.70)

## 2017-03-15 MED ORDER — METOPROLOL TARTRATE 12.5 MG HALF TABLET
12.5000 mg | ORAL_TABLET | Freq: Once | ORAL | Status: AC
  Administered 2017-03-15: 12.5 mg via ORAL
  Filled 2017-03-15: qty 1

## 2017-03-15 MED ORDER — CEPHALEXIN 500 MG PO CAPS
500.0000 mg | ORAL_CAPSULE | Freq: Two times a day (BID) | ORAL | Status: AC
  Administered 2017-03-15 – 2017-03-16 (×4): 500 mg via ORAL
  Filled 2017-03-15 (×4): qty 1

## 2017-03-15 MED ORDER — CARVEDILOL 6.25 MG PO TABS
6.2500 mg | ORAL_TABLET | Freq: Two times a day (BID) | ORAL | Status: DC
  Administered 2017-03-15 – 2017-03-17 (×4): 6.25 mg via ORAL
  Filled 2017-03-15 (×4): qty 1

## 2017-03-15 NOTE — Progress Notes (Signed)
Notified/Paged NP on call for [facility] of patient being in Afib with RVR.

## 2017-03-15 NOTE — Progress Notes (Signed)
ANTICOAGULATION CONSULT NOTE - Follow Up Consult  Pharmacy Consult for Heparin Indication: atrial fibrillation  Allergies  Allergen Reactions  . Codeine Hives  . Contrast Media [Iodinated Diagnostic Agents] Other (See Comments)    unspecified  . Diazepam Hives  . Meperidine Hcl Other (See Comments)    unspecified  . Morphine Other (See Comments)    "caused heart attack"    Patient Measurements: Height: 5\' 9"  (175.3 cm) Weight: 152 lb 16 oz (69.4 kg) IBW/kg (Calculated) : 66.2  Vital Signs: Temp: 97.7 F (36.5 C) (12/30 0546) Temp Source: Oral (12/30 0546) BP: 132/84 (12/30 0546) Pulse Rate: 139 (12/30 0546)  Labs: Recent Labs    03/12/17 1936 03/12/17 2335 03/13/17 0341 03/13/17 0731  03/13/17 1737 03/14/17 1108 03/14/17 2037 03/15/17 0439  HGB  --   --  10.7*  --   --   --  11.3*  --  11.2*  HCT  --   --  33.8*  --   --   --  37.0  --  36.7  PLT  --   --  666*  --   --   --  605*  --  660*  HEPARINUNFRC  --   --  0.56  --    < >  --  0.19* 0.25* 0.43  CREATININE  --   --  0.96  --   --  1.09* 0.99  --  0.97  TROPONINI 0.29* 0.25*  --  0.27*  --   --   --   --   --    < > = values in this interval not displayed.    Estimated Creatinine Clearance: 43.5 mL/min (by C-G formula based on SCr of 0.97 mg/dL).   Medications:  Heparin @ 1350 units/hr  Assessment: 88yof continues on heparin for new afib with plan for possible cath next week. Heparin level is therapeutic at 0.43. CBC stable. No bleeding reported.  Goal of Therapy:  Heparin level 0.3-0.7 units/ml Monitor platelets by anticoagulation protocol: Yes   Plan:  1) Continue heparin at 1350 units/hr 2) Daily heparin level and CBC 3) Follow up plan for cath  Deboraha Sprang 03/15/2017,11:16 AM

## 2017-03-15 NOTE — Progress Notes (Signed)
DAILY PROGRESS NOTE   Patient Name: Tina West Date of Encounter: 03/15/2017  Chief Complaint   No complaints  Patient Profile   83 y.o.femalew/ PMH of chronic diastolic CHF, HTN, hypothyroidism, and anxiety who presented to [facility] ED on 03/12/2017 for evaluation of worsening edema, orthopnea, and lower extremity edema in the setting of medication noncompliance secondary to financial issues.  Subjective   A-fib with RVR noted overnight - bp stable today. Progressive hyponatremia noted - sodium 131 today. Creatinine stable. Lipid profile is good with LDL 90. LE arterial dopplers suggest bilateral moderate PAD - not thought to be a surgical candidate to due cardiomyopathy. She indicated she would be interested in cath. Diuresed 1.2L Negative.   Objective   Vitals:   03/14/17 1741 03/14/17 2131 03/15/17 0248 03/15/17 0546  BP: 120/86 139/77 122/79 132/84  Pulse: (!) 106 (!) 134 (!) 135 (!) 139  Resp: _0 Temp: 97.8 F (36.6 C) 98.1 F (36.7 C)  97.7 F (36.5 C)  TempSrc: Oral Oral  Oral  SpO2: 95% 95%  95%  Weight:    152 lb 16 oz (69.4 kg)  Height:        Intake/Output Summary (Last 24 hours) at 03/15/2017 1031 Last data filed at 03/15/2017 0900 Gross per 24 hour  Intake 1370 ml  Output 1200 ml  Net 170 ml   Filed Weights   03/13/17 0323 03/14/17 0331 03/15/17 0546  Weight: 148 lb 13 oz (67.5 kg) 151 lb 3.8 oz (68.6 kg) 152 lb 16 oz (69.4 kg)    Physical Exam   General appearance: somnolent Lungs: clear to auscultation bilaterally Heart: irregularly irregular rhythm Extremities: extremities normal, atraumatic, no cyanosis or edema Neurologic: Mental status: sleepy  Inpatient Medications    Scheduled Meds: . atorvastatin  20 mg Oral q1800  . calcium carbonate  1 tablet Oral TID WC  . carvedilol  3.125 mg Oral BID WC  . collagenase   Topical Daily  . feeding supplement (ENSURE ENLIVE)  237 mL Oral TID BM  . furosemide  40 mg  Intravenous BID  . Influenza vac split quadrivalent PF  0.5 mL Intramuscular Tomorrow-1000  . levothyroxine  150 mcg Oral QAC breakfast  . multivitamin with minerals  1 tablet Oral Daily  . pneumococcal 23 valent vaccine  0.5 mL Intramuscular Tomorrow-1000  . sodium chloride flush  3 mL Intravenous Q12H    Continuous Infusions: . sodium chloride    .  ceFAZolin (ANCEF) IV Stopped (03/15/17 6948)  . heparin 1,350 Units/hr (03/15/17 0037)    PRN Meds: sodium chloride, acetaminophen **OR** acetaminophen, ALPRAZolam, alum & mag hydroxide-simeth, ondansetron **OR** ondansetron (ZOFRAN) IV, sodium chloride flush   Labs   Results for orders placed or performed during the hospital encounter of 03/12/17 (from the past 48 hour(s))  Heparin level (unfractionated)     Status: None   Collection Time: 03/13/17 11:33 AM  Result Value Ref Range   Heparin Unfractionated 0.31 0.30 - 0.70 IU/mL    Comment:        IF HEPARIN RESULTS ARE BELOW EXPECTED VALUES, AND PATIENT DOSAGE HAS BEEN CONFIRMED, SUGGEST FOLLOW UP TESTING OF ANTITHROMBIN III LEVELS.   C-reactive protein     Status: Abnormal   Collection Time: 03/13/17 11:33 AM  Result Value Ref Range   CRP 1.0 (H) <1.0 mg/dL  Prealbumin     Status: Abnormal   Collection Time: 03/13/17 11:33 AM  Result Value Ref Range  Prealbumin 16.2 (L) 18 - 38 mg/dL  Sedimentation rate     Status: None   Collection Time: 03/13/17 11:33 AM  Result Value Ref Range   Sed Rate 0 0 - 22 mm/hr  Basic metabolic panel     Status: Abnormal   Collection Time: 03/13/17  5:37 PM  Result Value Ref Range   Sodium 133 (L) 135 - 145 mmol/L   Potassium 3.9 3.5 - 5.1 mmol/L   Chloride 99 (L) 101 - 111 mmol/L   CO2 22 22 - 32 mmol/L   Glucose, Bld 154 (H) 65 - 99 mg/dL   BUN 37 (H) 6 - 20 mg/dL   Creatinine, Ser 1.09 (H) 0.44 - 1.00 mg/dL   Calcium 8.9 8.9 - 10.3 mg/dL   GFR calc non Af Amer 45 (L) >60 mL/min   GFR calc Af Amer 52 (L) >60 mL/min    Comment:  (NOTE) The eGFR has been calculated using the CKD EPI equation. This calculation has not been validated in all clinical situations. eGFR's persistently <60 mL/min signify possible Chronic Kidney Disease.    Anion gap 12 5 - 15  Magnesium     Status: None   Collection Time: 03/13/17  5:37 PM  Result Value Ref Range   Magnesium 1.8 1.7 - 2.4 mg/dL  CBC     Status: Abnormal   Collection Time: 03/14/17 11:08 AM  Result Value Ref Range   WBC 9.6 4.0 - 10.5 K/uL   RBC 4.46 3.87 - 5.11 MIL/uL   Hemoglobin 11.3 (L) 12.0 - 15.0 g/dL   HCT 37.0 36.0 - 46.0 %   MCV 83.0 78.0 - 100.0 fL   MCH 25.3 (L) 26.0 - 34.0 pg   MCHC 30.5 30.0 - 36.0 g/dL   RDW 15.7 (H) 11.5 - 15.5 %   Platelets 605 (H) 150 - 400 K/uL  Comprehensive metabolic panel     Status: Abnormal   Collection Time: 03/14/17 11:08 AM  Result Value Ref Range   Sodium 134 (L) 135 - 145 mmol/L   Potassium 3.3 (L) 3.5 - 5.1 mmol/L   Chloride 96 (L) 101 - 111 mmol/L   CO2 25 22 - 32 mmol/L   Glucose, Bld 96 65 - 99 mg/dL   BUN 31 (H) 6 - 20 mg/dL   Creatinine, Ser 0.99 0.44 - 1.00 mg/dL   Calcium 8.3 (L) 8.9 - 10.3 mg/dL   Total Protein 5.7 (L) 6.5 - 8.1 g/dL   Albumin 3.2 (L) 3.5 - 5.0 g/dL   AST 68 (H) 15 - 41 U/L   ALT 85 (H) 14 - 54 U/L   Alkaline Phosphatase 62 38 - 126 U/L   Total Bilirubin 1.1 0.3 - 1.2 mg/dL   GFR calc non Af Amer 50 (L) >60 mL/min   GFR calc Af Amer 58 (L) >60 mL/min    Comment: (NOTE) The eGFR has been calculated using the CKD EPI equation. This calculation has not been validated in all clinical situations. eGFR's persistently <60 mL/min signify possible Chronic Kidney Disease.    Anion gap 13 5 - 15  Lipase, blood     Status: None   Collection Time: 03/14/17 11:08 AM  Result Value Ref Range   Lipase 33 11 - 51 U/L  Heparin level (unfractionated)     Status: Abnormal   Collection Time: 03/14/17 11:08 AM  Result Value Ref Range   Heparin Unfractionated 0.19 (L) 0.30 - 0.70 IU/mL    Comment:  IF HEPARIN RESULTS ARE BELOW EXPECTED VALUES, AND PATIENT DOSAGE HAS BEEN CONFIRMED, SUGGEST FOLLOW UP TESTING OF ANTITHROMBIN III LEVELS.   Heparin level (unfractionated)     Status: Abnormal   Collection Time: 03/14/17  8:37 PM  Result Value Ref Range   Heparin Unfractionated 0.25 (L) 0.30 - 0.70 IU/mL    Comment:        IF HEPARIN RESULTS ARE BELOW EXPECTED VALUES, AND PATIENT DOSAGE HAS BEEN CONFIRMED, SUGGEST FOLLOW UP TESTING OF ANTITHROMBIN III LEVELS.   CBC     Status: Abnormal   Collection Time: 03/15/17  4:39 AM  Result Value Ref Range   WBC 12.5 (H) 4.0 - 10.5 K/uL   RBC 4.40 3.87 - 5.11 MIL/uL   Hemoglobin 11.2 (L) 12.0 - 15.0 g/dL   HCT 36.7 36.0 - 46.0 %   MCV 83.4 78.0 - 100.0 fL   MCH 25.5 (L) 26.0 - 34.0 pg   MCHC 30.5 30.0 - 36.0 g/dL   RDW 15.9 (H) 11.5 - 15.5 %   Platelets 660 (H) 150 - 400 K/uL  Basic metabolic panel     Status: Abnormal   Collection Time: 03/15/17  4:39 AM  Result Value Ref Range   Sodium 131 (L) 135 - 145 mmol/L   Potassium 3.9 3.5 - 5.1 mmol/L   Chloride 92 (L) 101 - 111 mmol/L   CO2 26 22 - 32 mmol/L   Glucose, Bld 151 (H) 65 - 99 mg/dL   BUN 39 (H) 6 - 20 mg/dL   Creatinine, Ser 0.97 0.44 - 1.00 mg/dL   Calcium 8.9 8.9 - 10.3 mg/dL   GFR calc non Af Amer 51 (L) >60 mL/min   GFR calc Af Amer 60 (L) >60 mL/min    Comment: (NOTE) The eGFR has been calculated using the CKD EPI equation. This calculation has not been validated in all clinical situations. eGFR's persistently <60 mL/min signify possible Chronic Kidney Disease.    Anion gap 13 5 - 15  Lipid panel     Status: None   Collection Time: 03/15/17  4:39 AM  Result Value Ref Range   Cholesterol 164 0 - 200 mg/dL   Triglycerides 30 <150 mg/dL   HDL 68 >40 mg/dL   Total CHOL/HDL Ratio 2.4 RATIO   VLDL 6 0 - 40 mg/dL   LDL Cholesterol 90 0 - 99 mg/dL    Comment:        Total Cholesterol/HDL:CHD Risk Coronary Heart Disease Risk Table                     Men    Women  1/2 Average Risk   3.4   3.3  Average Risk       5.0   4.4  2 X Average Risk   9.6   7.1  3 X Average Risk  23.4   11.0        Use the calculated Patient Ratio above and the CHD Risk Table to determine the patient's CHD Risk.        ATP III CLASSIFICATION (LDL):  <100     mg/dL   Optimal  100-129  mg/dL   Near or Above                    Optimal  130-159  mg/dL   Borderline  160-189  mg/dL   High  >190     mg/dL   Very High   Heparin level (  unfractionated)     Status: None   Collection Time: 03/15/17  4:39 AM  Result Value Ref Range   Heparin Unfractionated 0.43 0.30 - 0.70 IU/mL    Comment:        IF HEPARIN RESULTS ARE BELOW EXPECTED VALUES, AND PATIENT DOSAGE HAS BEEN CONFIRMED, SUGGEST FOLLOW UP TESTING OF ANTITHROMBIN III LEVELS.     ECG   N/A  Telemetry   afib with RVR - Personally Reviewed  Radiology    No results found.  Cardiac Studies   N/A  Assessment   1. Principal Problem: 2.   A-fib ([place]) 3. Active Problems: 4.   Essential hypertension 5.   Left leg cellulitis 6.   Hypothyroidism 7.   Demand ischemia ([place]) 8.   Acute on chronic diastolic CHF (congestive heart failure) ([place]) 9.   Thrombocytosis ([place]) 10.   Protein-calorie malnutrition, severe 11.   Acute systolic CHF (congestive heart failure) ([place]) 12.   Plan   1. Breathing better - diuresed 1.2L negative. Weight stable. Appreciate vascular recs - she has moderate PAD - higher likelihood of ischemic cardiomyopathy. Would recommend R/LHC this week if she is agreeable - seemed somewhat confused yesterday - not clear if she can consent. D/W daughter in-law (who works at [employer]) and she is working to get [employer]. Continue medical therapy for now.  Time Spent Directly with Patient:  I have spent a total of 15 minutes with the patient reviewing hospital notes, telemetry, EKGs, labs and examining the patient as well as establishing an assessment and plan that was discussed personally with  the patient. > 50% of time was spent in direct patient care.  Length of Stay:  LOS: 3 days   Pixie Casino, MD, [facility], [place] Director of the Advanced Lipid Disorders &  Cardiovascular Risk Reduction Clinic Attending Cardiologist  Direct Dial: [phone]  Fax: [phone]  Website:  [email or web address] [place] 03/15/2017, 10:31 AM

## 2017-03-15 NOTE — Progress Notes (Signed)
PROGRESS NOTE  Tina West PYK:[id number] DOB: [birth year] DOA: 03/12/2017 PCP: Claretta Fraise, MD  Brief Narrative: 88yow presented with LE edema, orthopnea, DOE. Admitted for (new) afib/RVR, elevated torponin,   Assessment/Plan Afib RVR, new onset with associated demand ischemia. CHA2DS2-VASc 5. - HR overall controlled on BB - Needs anticoagulation after cath but financial limitations may limit NOAC use and non-compliance makes warfarin suboptimal. CM consulted.  New cardiomypopathy LVEF 53-97%, acute systolic CHF (new), acute on chronic diastolic CHF with anasarca and associated demand ischemia  - appreciate cardiology care; continue BB.  - R/LHC soon - add ACE-I if BP can tolerate  Possible cellulitis left foot with ulcerations noted present on admission (loook like secondary to ruptured bulla but do suspect PAD). No evidence of acute limb ischemia. - appreciated VVS eval   - Keflex. Total course abx 5 days  Bilateral moderate peripheral vascular disease with non-compressible tibilal arteries - VVS recs:  Pt is NOT a surgical candidate at this point with acute changes in cardiac function - Cardiology recommends cardiac catheterization so would hold off on peripheral angiography to avoid compounding any contrast induced nephropathy - At this point, manage the feet with wound care:  Santyl as currently ordered  Compression once necrotic skin debrided  Would get Dr. Jess Barters opinion once he is back in town given his silver impregnated compression stockings - Reconsult once cardiac status stable   Hypothyroidism, TSH 32.9, off outpatient meds - continue levothyroxine @ 137 mcg  Thrombocytosis, chronic - f/u as an outpatient  On CT small amount of retroperitoneal edema inferior to the duodenum and pancreatic head. Duodenitis/peptic ulcer disease could cause this appearance. Groove pancreatitis would be another consideration. - lipase WNL. Elevated transaminases, suspect  secondary to heart failure. - will follow clinically, repeat CMP 12/31  Aortic atherosclerosis  - statin  Severe malnutrition - supplements per dietician  DVT prophylaxis: heparin gtt Code Status: full Family Communication: son at bedside 12/30 Disposition Plan: home    Murray Hodgkins, MD  [facility] Direct contact: [phone] --[place]ia [place]  --[email or web address]; password [email or web address]  7PM-7AM contact night coverage as above 03/15/2017, 8:51 AM  LOS: 3 days   Consultants:   Cardiology   VVS   Procedures:  Echo Study Conclusions  - Left ventricle: The cavity size was mildly dilated. There was   moderate concentric hypertrophy. Systolic function was severely   reduced. The estimated ejection fraction was in the range of 15%   to 20%. Diffuse hypokinesis. The study was not technically   sufficient to allow evaluation of LV diastolic dysfunction due to   atrial fibrillation. - Ventricular septum: Septal motion showed paradox. - Aortic valve: Valve mobility was restricted. - Aortic root: The aortic root was normal in size. - Mitral valve: Calcified annulus. Mildly thickened leaflets .   There was mild regurgitation. - Left atrium: The atrium was moderately dilated. - Right ventricle: The cavity size was severely dilated. Wall   thickness was normal. Systolic function was moderately reduced. - Right atrium: The atrium was moderately dilated. - Tricuspid valve: There was moderate regurgitation. - Pulmonary arteries: Systolic pressure was moderately increased.   PA peak pressure: 54 mm Hg (S). - Inferior vena cava: The vessel was dilated. The respirophasic   diameter changes were blunted (< 50%), consistent with elevated   central venous pressure. - Pericardium, extracardiac: There was no pericardial effusion.  Antimicrobials:  Cefazolin 12/27 >>  Interval history/Subjective: Transferred to tele. RVR overnight.  Feels ok today, breathing  better,  tired.  Objective: Vitals:  Vitals:   03/15/17 0248 03/15/17 0546  BP: 122/79 132/84  Pulse: (!) 135 (!) 139  Resp:  18  Temp:  97.7 F (36.5 C)  SpO2:  95%    Exam: Constitutional:   . Appears calm and comfortable Eyes:  . pupils and irises appear normal . Normal lids ENMT:  . grossly normal hearing  . Lips appear normal Respiratory:  . CTA bilaterally, no w/r/r.  . Respiratory effort normal.  Cardiovascular:  . RRR, no m/r/g . 1+ BLE extremity edema , much improved Musculoskeletal:  . Digits BUE: no cyanosis, petechiae, infection . RUE, LUE, RLE, LLE   o strength and tone normal, no atrophy, no abnormal movements Skin:  . Bilateral feet bandaged Psychiatric:  . Mental status o Mood difficult to assess, affect odd . judgement and insight appear impaired   I have personally reviewed the following:  Filed Weights   03/13/17 0323 03/14/17 0331 03/15/17 0546  Weight: 67.5 kg (148 lb 13 oz) 68.6 kg (151 lb 3.8 oz) 69.4 kg (152 lb 16 oz)   Weight change: 0.8 kg (1 lb 12.2 oz)  Labs:    Scheduled Meds: . atorvastatin  20 mg Oral q1800  . calcium carbonate  1 tablet Oral TID WC  . carvedilol  3.125 mg Oral BID WC  . collagenase   Topical Daily  . feeding supplement (ENSURE ENLIVE)  237 mL Oral TID BM  . furosemide  40 mg Intravenous BID  . Influenza vac split quadrivalent PF  0.5 mL Intramuscular Tomorrow-1000  . levothyroxine  150 mcg Oral QAC breakfast  . multivitamin with minerals  1 tablet Oral Daily  . pneumococcal 23 valent vaccine  0.5 mL Intramuscular Tomorrow-1000  . sodium chloride flush  3 mL Intravenous Q12H   Continuous Infusions: . sodium chloride    .  ceFAZolin (ANCEF) IV 1 g (03/15/17 0723)  . heparin 1,350 Units/hr (03/15/17 0037)    Principal Problem:   A-fib ([facility]) Active Problems:   Essential hypertension   Left leg cellulitis   Hypothyroidism   Demand ischemia ([facility])   Acute on chronic diastolic CHF (congestive heart failure)  (H[facility])   Thrombocytosis ([facility])   Protein-calorie malnutrition, severe   Acute systolic CHF (congestive heart failure) ([place])   LOS: 3 days

## 2017-03-15 NOTE — Progress Notes (Signed)
Pt stable throughout the day, IV heparin continue @13 .5, no any complication of chest pain, SOB and distress, dressing chnged on her BLLE, Santyl applied, will continue to monitor the patient

## 2017-03-15 NOTE — Care Management Note (Signed)
Case Management Note  Patient Details  Name: Tina West MRN: [id number] Date of Birth: 09-10-1928  Subjective/Objective:        Pt presented for lower extremity edema and LLE cellulitis.    Pt from home with son, who is mentally incompetent to care for self or pt.  Pt's son in room during assessment and able to give some information but clearly does not understand processes of insurance, filling prescriptions, etc.  Pt and son do not drive and pay neighbors to transport them, sometimes up to $100.  Usually pt goes on errands alone.  The pt's grandson's wife, Tina West, is a NA on 34M and has been trying to help but states pt refuses her offers to go to MD appointments or grocery store.  Tina West lives 30 mins from pt but is willing to become POA, etc.  Tina West states pt has $14k in the bank but has refused to go to MD for >47yr, so prescriptions are out of date.  Pt's son showed me empty prescription bottles with 2017 date.  They used to get [facility] [place] but son is unsure of cost.  He states they both have SSDI and it's not enough to cover both of their medications.  Also, he asked for a delivery service since they don't have transportation.    Pt has a Psychologist, prison and probation services drug coverage plan and most of drugs on old prescription bottles are on [facility] $4 list.          Action/Plan: Attempted to call 1800 number on insurance card for drug plan to inquire about delivery.  No one available on Sunday. Can attempt to call back at another time.    [facility] offers free delivery in [place].  They can deliver to pt's address same day if prescriptions received by 4pm.  They will not be able to deliver on January 1st.  CSW referral to check into transportation resources in [place].  Also, pt and son need a SW to visit the home.  Can consider a HH SW or another lower cost option if available.    Expected Discharge Date:                  Expected Discharge  Plan:  [place]  In-House Referral:  Clinical Social Work  Discharge planning Services  CM Consult  Post Acute Care Choice:    Choice offered to:     DME Arranged:    DME Agency:     HH Arranged:    [place] Agency:     Status of Service:  In process, will continue to follow  If discussed at Long Length of Stay Meetings, dates discussed:    Additional Comments:  Arley Phenix, RN 03/15/2017, 9:50 AM

## 2017-03-16 ENCOUNTER — Encounter (HOSPITAL_COMMUNITY): Payer: Self-pay | Admitting: Family Medicine

## 2017-03-16 DIAGNOSIS — I5033 Acute on chronic diastolic (congestive) heart failure: Secondary | ICD-10-CM

## 2017-03-16 DIAGNOSIS — I739 Peripheral vascular disease, unspecified: Secondary | ICD-10-CM

## 2017-03-16 HISTORY — DX: Peripheral vascular disease, unspecified: I73.9

## 2017-03-16 LAB — CBC
HEMATOCRIT: 37.5 % (ref 36.0–46.0)
Hemoglobin: 11.3 g/dL — ABNORMAL LOW (ref 12.0–15.0)
MCH: 25.3 pg — ABNORMAL LOW (ref 26.0–34.0)
MCHC: 30.1 g/dL (ref 30.0–36.0)
MCV: 84.1 fL (ref 78.0–100.0)
PLATELETS: 615 10*3/uL — AB (ref 150–400)
RBC: 4.46 MIL/uL (ref 3.87–5.11)
RDW: 15.7 % — AB (ref 11.5–15.5)
WBC: 13.1 10*3/uL — AB (ref 4.0–10.5)

## 2017-03-16 LAB — COMPREHENSIVE METABOLIC PANEL
ALBUMIN: 3.3 g/dL — AB (ref 3.5–5.0)
ALK PHOS: 78 U/L (ref 38–126)
ALT: 75 U/L — ABNORMAL HIGH (ref 14–54)
ANION GAP: 12 (ref 5–15)
AST: 52 U/L — ABNORMAL HIGH (ref 15–41)
BUN: 38 mg/dL — ABNORMAL HIGH (ref 6–20)
CALCIUM: 9 mg/dL (ref 8.9–10.3)
CHLORIDE: 89 mmol/L — AB (ref 101–111)
CO2: 33 mmol/L — AB (ref 22–32)
Creatinine, Ser: 0.76 mg/dL (ref 0.44–1.00)
GFR calc non Af Amer: >60 mL/min (ref >60)
GLUCOSE: 150 mg/dL — AB (ref 65–99)
POTASSIUM: 3.8 mmol/L (ref 3.5–5.1)
SODIUM: 134 mmol/L — AB (ref 135–145)
Total Bilirubin: 1.2 mg/dL (ref 0.3–1.2)
Total Protein: 5.7 g/dL — ABNORMAL LOW (ref 6.5–8.1)

## 2017-03-16 LAB — HEPARIN LEVEL (UNFRACTIONATED): HEPARIN UNFRACTIONATED: 0.31 [IU]/mL (ref 0.30–0.70)

## 2017-03-16 MED ORDER — FUROSEMIDE 10 MG/ML IJ SOLN
40.0000 mg | Freq: Every day | INTRAMUSCULAR | Status: DC
  Administered 2017-03-17 – 2017-03-18 (×2): 40 mg via INTRAVENOUS
  Filled 2017-03-16 (×2): qty 4

## 2017-03-16 NOTE — Progress Notes (Signed)
ANTICOAGULATION CONSULT NOTE - Follow Up Consult  Pharmacy Consult for Heparin Indication: atrial fibrillation  Allergies  Allergen Reactions  . Codeine Hives  . Contrast Media [Iodinated Diagnostic Agents] Other (See Comments)    unspecified  . Diazepam Hives  . Meperidine Hcl Other (See Comments)    unspecified  . Morphine Other (See Comments)    "caused heart attack"    Patient Measurements: Height: 5\' 9"  (175.3 cm) Weight: 150 lb 9.2 oz (68.3 kg)(bed) IBW/kg (Calculated) : 66.2  Vital Signs: Temp: 98.2 F (36.8 C) (12/31 0523) Temp Source: Oral (12/31 0523) BP: 110/73 (12/31 0523) Pulse Rate: 88 (12/31 0523)  Labs: Recent Labs    03/14/17 1108 03/14/17 2037 03/15/17 0439 03/16/17 0555  HGB 11.3*  --  11.2* 11.3*  HCT 37.0  --  36.7 37.5  PLT 605*  --  660* 615*  HEPARINUNFRC 0.19* 0.25* 0.43 0.31  CREATININE 0.99  --  0.97 0.76    Estimated Creatinine Clearance: 52.8 mL/min (by C-G formula based on SCr of 0.76 mg/dL).   Medications:  Heparin @ 1350 units/hr  Assessment: 88yof continues on heparin for new afib with plan for possible cath next week. Heparin level is therapeutic. CBC stable. No bleeding reported.  Goal of Therapy:  Heparin level 0.3-0.7 units/ml Monitor platelets by anticoagulation protocol: Yes   Plan:  1) Continue heparin at 1350 units/hr 2) Monitor daily heparin level and CBC, s/sx bleeding 3) Follow up plan for cath   Elicia Lamp, PharmD, BCPS Clinical Pharmacist Clinical phone for 03/16/2017 until 3:30pm: [phone] If after 3:30pm, please call main pharmacy at: [phone] 03/16/2017 8:26 AM

## 2017-03-16 NOTE — Progress Notes (Signed)
   03/16/17 1000  Clinical Encounter Type  Visited With Patient and family together  Visit Type Follow-up  Referral From Nurse  Consult/Referral To Chaplain  Spiritual Encounters  Spiritual Needs Literature  Stress Factors  Patient Stress Factors Health changes  Family Stress Factors Major life changes  Chaplain visited with the PT to get AD signed.  PT signed AD in presence of family.  PT talked a little bit about religion including being baptized and family of origin being [place].

## 2017-03-16 NOTE — Progress Notes (Signed)
PROGRESS NOTE  Tina West CVE:[id number] DOB: [birth year] DOA: 03/12/2017 PCP: Claretta Fraise, MD  Brief Narrative: 88yow presented with LE edema, orthopnea, DOE. Admitted for (new) afib/RVR, volume overload, elevated troponin. HR quickly controlled; responded well to diuresis; echo showed new cardiomyopathy LVEF 15-20%. Plan for [facility]  Assessment/Plan Afib RVR, new onset with associated demand ischemia. CHA2DS2-VASc 5. - episodes of tachycardia noted; BP stable, Coreg increased last evening. - Needs anticoagulation after cath but financial limitations may limit NOAC use and non-compliance makes warfarin suboptimal. CM consulted.  New cardiomyopathy LVEF 02-58%, acute systolic CHF (new), acute on chronic diastolic CHF with anasarca and associated demand ischemia  - clinically much improved; BLE edema nearly resolved  - continue BB - R/LHC soon - add ACE-I if BP can tolerate once HR better controlled  Possible cellulitis left foot with ulcerations noted present on admission (loook like secondary to ruptured bulla but do suspect PAD). No evidence of acute limb ischemia. - appreciate VVS eval   - Keflex. Total course abx 5 days--finish today  Bilateral moderate peripheral vascular disease with non-compressible tibilal arteries - VVS recs:  Pt is NOT a surgical candidate at this point with acute changes in cardiac function - At this point, manage the feet with wound care:  Santyl as currently ordered  Compression once necrotic skin debrided  Would get Dr. Jess Barters opinion once he is back in town given his silver impregnated compression stockings--message sent via Epic 12/31 - Reconsult once cardiac status stable   Hypothyroidism, TSH 32.9, off outpatient meds - continue levothyroxine @ 137 mcg  Thrombocytosis, chronic - f/u as an outpatient - check B12, folate  On CT small amount of retroperitoneal edema inferior to the duodenum and pancreatic head. Duodenitis/peptic ulcer  disease could cause this appearance. Groove pancreatitis would be another consideration. - lipase WNL. Elevated transaminases, suspect secondary to heart failure--trending down; no further eval suggested  Aortic atherosclerosis  - continue statin  Severe malnutrition - supplements per dietician 12/31  DVT prophylaxis: heparin gtt Code Status: full Family Communication: son at bedside 12/31 Disposition Plan: home    Murray Hodgkins, MD  [facility] Direct contact: [phone] --[place]  --[email or web address]; password [email or web address]  7PM-7AM contact night coverage as above 03/16/2017, 10:46 AM  LOS: 4 days   Consultants:   Cardiology   VVS   Procedures:  Echo Study Conclusions  - Left ventricle: The cavity size was mildly dilated. There was   moderate concentric hypertrophy. Systolic function was severely   reduced. The estimated ejection fraction was in the range of 15%   to 20%. Diffuse hypokinesis. The study was not technically   sufficient to allow evaluation of LV diastolic dysfunction due to   atrial fibrillation. - Ventricular septum: Septal motion showed paradox. - Aortic valve: Valve mobility was restricted. - Aortic root: The aortic root was normal in size. - Mitral valve: Calcified annulus. Mildly thickened leaflets .   There was mild regurgitation. - Left atrium: The atrium was moderately dilated. - Right ventricle: The cavity size was severely dilated. Wall   thickness was normal. Systolic function was moderately reduced. - Right atrium: The atrium was moderately dilated. - Tricuspid valve: There was moderate regurgitation. - Pulmonary arteries: Systolic pressure was moderately increased.   PA peak pressure: 54 mm Hg (S). - Inferior vena cava: The vessel was dilated. The respirophasic   diameter changes were blunted (< 50%), consistent with elevated   central venous pressure. - Pericardium, extracardiac: There was no  pericardial  effusion.  Antimicrobials:  Cefazolin 12/27 >> 12/30  Keflex 12/30 >> 12/31  Interval history/Subjective: Feels better.  Objective: Vitals:  Vitals:   03/15/17 2004 03/16/17 0523  BP: 124/75 110/73  Pulse: (!) 49 88  Resp: 18 16  Temp: 97.8 F (36.6 C) 98.2 F (36.8 C)  SpO2: 98% 97%    Exam:  Constitutional:   . Appears calm and comfortable Eyes:  . pupils and irises appear normal . Normal lids ENMT:  . grossly normal hearing  . Lips appear normal Respiratory:  . CTA bilaterally, no w/r/r.  . Respiratory effort normal.  Cardiovascular:  . Irregular, no m/r/g . 1+right pedal edema, LLE edema has resolved. Irregular wide-complex rhythm  Skin:  . Erythema fading BLE Psychiatric:  . Mental status o Mood, affect appropriate . judgement and insight appears impaired   I have personally reviewed the following:  Filed Weights   03/14/17 0331 03/15/17 0546 03/16/17 0253  Weight: 68.6 kg (151 lb 3.8 oz) 69.4 kg (152 lb 16 oz) 68.3 kg (150 lb 9.2 oz)   Weight change: -1.1 kg (-6.8 oz) UOP 2200 -2.3 L since admit BM x1  Labs:  BMP unremarkable  Transaminases trending down  WBC 13  Hgb stable 11.3  Plts stable 615  Scheduled Meds: . atorvastatin  20 mg Oral q1800  . calcium carbonate  1 tablet Oral TID WC  . carvedilol  6.25 mg Oral BID WC  . cephALEXin  500 mg Oral Q12H  . collagenase   Topical Daily  . feeding supplement (ENSURE ENLIVE)  237 mL Oral TID BM  . furosemide  40 mg Intravenous BID  . levothyroxine  150 mcg Oral QAC breakfast  . multivitamin with minerals  1 tablet Oral Daily  . sodium chloride flush  3 mL Intravenous Q12H   Continuous Infusions: . sodium chloride    . heparin 1,350 Units/hr (03/15/17 1723)    Principal Problem:   A-fib ([place]) Active Problems:   Essential hypertension   Left leg cellulitis   Hypothyroidism   Demand ischemia (HCC)   Acute on chronic diastolic CHF (congestive heart failure) (HCC)    Thrombocytosis (HCC)   Protein-calorie malnutrition, severe   Acute systolic CHF (congestive heart failure) ([place])   LOS: 4 days

## 2017-03-16 NOTE — Progress Notes (Addendum)
Progress Note  Patient Name: Tina West Date of Encounter: 03/16/2017  Primary Cardiologist: Dr Debara Pickett, pt request (he is her dtr's cardiologist)  Subjective   Denies CP or SOB.   Dtr was made POA today. She has discussed w/ her mother, they want the R/L cath, understanding it will be Weds.  Inpatient Medications    Scheduled Meds: . atorvastatin  20 mg Oral q1800  . calcium carbonate  1 tablet Oral TID WC  . carvedilol  6.25 mg Oral BID WC  . cephALEXin  500 mg Oral Q12H  . collagenase   Topical Daily  . feeding supplement (ENSURE ENLIVE)  237 mL Oral TID BM  . furosemide  40 mg Intravenous BID  . levothyroxine  150 mcg Oral QAC breakfast  . multivitamin with minerals  1 tablet Oral Daily  . sodium chloride flush  3 mL Intravenous Q12H   Continuous Infusions: . sodium chloride    . heparin 1,350 Units/hr (03/15/17 1723)   PRN Meds: sodium chloride, acetaminophen **OR** acetaminophen, ALPRAZolam, alum & mag hydroxide-simeth, ondansetron **OR** ondansetron (ZOFRAN) IV, sodium chloride flush   Vital Signs    Vitals:   03/15/17 1232 03/15/17 2004 03/16/17 0253 03/16/17 0523  BP: 113/73 124/75  110/73  Pulse: (!) 51 (!) 49  88  Resp: 16 18  16   Temp: 97.9 F (36.6 C) 97.8 F (36.6 C)  98.2 F (36.8 C)  TempSrc: Oral Oral  Oral  SpO2: 95% 98%  97%  Weight:   68.3 kg (150 lb 9.2 oz)   Height:        Intake/Output Summary (Last 24 hours) at 03/16/2017 1017 Last data filed at 03/16/2017 0600 Gross per 24 hour  Intake 962.58 ml  Output 2200 ml  Net -1237.42 ml   Filed Weights   03/14/17 0331 03/15/17 0546 03/16/17 0253  Weight: 68.6 kg (151 lb 3.8 oz) 69.4 kg (152 lb 16 oz) 68.3 kg (150 lb 9.2 oz)    Telemetry    Atrial fib, no VT sustained in >24 hr - Personally Reviewed  ECG    n/a - Personally Reviewed  Physical Exam   GEN: No acute distress.   Neck: JVD seen R>L Cardiac: Irreg R&R soft murmurs, no rubs, or gallops.  Respiratory:  decreased BS bases bilat GI: Soft, nontender, non-distended  MS: No edema; No deformity. Neuro:  Nonfocal  Psych: Normal affect   Labs    Chemistry Recent Labs  Lab 03/14/17 1108 03/15/17 0439 03/16/17 0555  NA 134* 131* 134*  K 3.3* 3.9 3.8  CL 96* 92* 89*  CO2 25 26 33*  GLUCOSE 96 151* 150*  BUN 31* 39* 38*  CREATININE 0.99 0.97 0.76  CALCIUM 8.3* 8.9 9.0  PROT 5.7*  --  5.7*  ALBUMIN 3.2*  --  3.3*  AST 68*  --  52*  ALT 85*  --  75*  ALKPHOS 62  --  78  BILITOT 1.1  --  1.2  GFRNONAA 50* 51* >60  GFRAA 58* 60* >60  ANIONGAP 13 13 12      Hematology Recent Labs  Lab 03/14/17 1108 03/15/17 0439 03/16/17 0555  WBC 9.6 12.5* 13.1*  RBC 4.46 4.40 4.46  HGB 11.3* 11.2* 11.3*  HCT 37.0 36.7 37.5  MCV 83.0 83.4 84.1  MCH 25.3* 25.5* 25.3*  MCHC 30.5 30.5 30.1  RDW 15.7* 15.9* 15.7*  PLT 605* 660* 615*    Cardiac Enzymes Recent Labs  Lab 03/12/17 1524 03/12/17 1936  03/12/17 2335 03/13/17 0731  TROPONINI 0.29* 0.29* 0.25* 0.27*    Recent Labs  Lab 03/12/17 1148  TROPIPOC 0.31*     BNP Recent Labs  Lab 03/12/17 2335  BNP 1,846.7*     Radiology    No results found.  Cardiac Studies   ECHO: 03/13/2017 Study Conclusions - Left ventricle: The cavity size was mildly dilated. There was   moderate concentric hypertrophy. Systolic function was severely   reduced. The estimated ejection fraction was in the range of 15%   to 20%. Diffuse hypokinesis. The study was not technically   sufficient to allow evaluation of LV diastolic dysfunction due to   atrial fibrillation. - Ventricular septum: Septal motion showed paradox. - Aortic valve: Valve mobility was restricted. - Aortic root: The aortic root was normal in size. - Mitral valve: Calcified annulus. Mildly thickened leaflets .   There was mild regurgitation. - Left atrium: The atrium was moderately dilated. - Right ventricle: The cavity size was severely dilated. Wall   thickness was  normal. Systolic function was moderately reduced. - Right atrium: The atrium was moderately dilated. - Tricuspid valve: There was moderate regurgitation. - Pulmonary arteries: Systolic pressure was moderately increased.   PA peak pressure: 54 mm Hg (S). - Inferior vena cava: The vessel was dilated. The respirophasic   diameter changes were blunted (< 50%), consistent with elevated   central venous pressure. - Pericardium, extracardiac: There was no pericardial effusion.  Patient Profile     83 y.o. female with hx HTN, anxiety, hypothyroidism, diastolic CHF who was seen 12/27 for the evaluation of CHF and atrial fibrillation at the request of Dr. Lita Mains.  Assessment & Plan    Principal Problem: 1.  A-fib (HCC) - on heparin for anticoag, change to po when able  2. Acute syst CHF: - volume status good now, renal function stable - think can change Lasix to po  3. Elevated troponin, ?ICM vs NICM - needs R/L cath w/ CHF and elevated troponin - they agree, will schedule for Weds  Otherwise, per IM Active Problems:   Essential hypertension   Left leg cellulitis   Hypothyroidism   Demand ischemia (HCC)   Acute on chronic diastolic CHF (congestive heart failure) (HCC)   Thrombocytosis (HCC)   Protein-calorie malnutrition, severe   Acute systolic CHF (congestive heart failure) ([place])    For questions or updates, please contact [facility] Please consult [email or web address] for contact info under Cardiology/STEMI.      Signed, Rosaria Ferries, PA-C  03/16/2017, 10:17 AM    Attending Note:   The patient was seen and examined.  Agree with assessment and plan as noted above.  Changes made to the above note as needed.  Patient seen and independently examined with Rosaria Ferries, PA .   We discussed all aspects of the encounter. I agree with the assessment and plan as stated above.  1.   Acute on chronic systolic chf: Patient was found to have an ejection fraction of 20-25%.  She  has progressive symptoms consistent with heart failure.  Some degree of her heart failure is likely due to medication noncompliance although we should rule out ischemia.  We will schedule her for right and left heart catheterization on Wednesday.  She has an IV contrast allergy.  We will give her premedications.  2.  Atrial fibrillation: Rate is fairly well controlled.  She is on heparin for now.     I have spent a total of 40 minutes with  patient reviewing hospital  notes , telemetry, EKGs, labs and examining patient as well as establishing an assessment and plan that was discussed with the patient. > 50% of time was spent in direct patient care.    Thayer Headings, Brooke Bonito., MD, [facility] 03/16/2017, 11:02 AM [address]  [place] Pager [phone]

## 2017-03-16 NOTE — Progress Notes (Signed)
IV heparin continue @13 .5cc/hr, pt is tolerating her Ensure, vitals stable, this time no any complain of pain, SOB and distress, Son is in bed side and is updating, will continue to monitor the patient

## 2017-03-17 DIAGNOSIS — I739 Peripheral vascular disease, unspecified: Secondary | ICD-10-CM

## 2017-03-17 HISTORY — PX: COLONOSCOPY: SHX174

## 2017-03-17 LAB — CBC
HCT: 37 % (ref 36.0–46.0)
HEMOGLOBIN: 11.2 g/dL — AB (ref 12.0–15.0)
MCH: 25.3 pg — ABNORMAL LOW (ref 26.0–34.0)
MCHC: 30.3 g/dL (ref 30.0–36.0)
MCV: 83.7 fL (ref 78.0–100.0)
PLATELETS: 586 10*3/uL — AB (ref 150–400)
RBC: 4.42 MIL/uL (ref 3.87–5.11)
RDW: 15.6 % — ABNORMAL HIGH (ref 11.5–15.5)
WBC: 13.1 10*3/uL — AB (ref 4.0–10.5)

## 2017-03-17 LAB — BASIC METABOLIC PANEL
ANION GAP: 12 (ref 5–15)
BUN: 40 mg/dL — ABNORMAL HIGH (ref 6–20)
CO2: 33 mmol/L — ABNORMAL HIGH (ref 22–32)
Calcium: 9 mg/dL (ref 8.9–10.3)
Chloride: 87 mmol/L — ABNORMAL LOW (ref 101–111)
Creatinine, Ser: 0.68 mg/dL (ref 0.44–1.00)
GLUCOSE: 138 mg/dL — AB (ref 65–99)
POTASSIUM: 4.1 mmol/L (ref 3.5–5.1)
SODIUM: 132 mmol/L — AB (ref 135–145)

## 2017-03-17 LAB — CULTURE, BLOOD (ROUTINE X 2)
Culture: NO GROWTH
Culture: NO GROWTH

## 2017-03-17 LAB — VITAMIN B12: VITAMIN B 12: 331 pg/mL (ref 180–914)

## 2017-03-17 LAB — GLUCOSE, CAPILLARY: Glucose-Capillary: 130 mg/dL — ABNORMAL HIGH (ref 65–99)

## 2017-03-17 LAB — MAGNESIUM: MAGNESIUM: 2.1 mg/dL (ref 1.7–2.4)

## 2017-03-17 LAB — HEPARIN LEVEL (UNFRACTIONATED)
HEPARIN UNFRACTIONATED: 0.75 [IU]/mL — AB (ref 0.30–0.70)
HEPARIN UNFRACTIONATED: 0.7 [IU]/mL (ref 0.30–0.70)

## 2017-03-17 MED ORDER — DIPHENHYDRAMINE HCL 50 MG/ML IJ SOLN: 25.0000 mg | INTRAMUSCULAR | Status: DC

## 2017-03-17 MED ORDER — METOPROLOL TARTRATE 50 MG PO TABS
50.0000 mg | ORAL_TABLET | Freq: Two times a day (BID) | ORAL | Status: DC
  Administered 2017-03-17 – 2017-03-27 (×21): 50 mg via ORAL
  Filled 2017-03-17 (×21): qty 1

## 2017-03-17 MED ORDER — DIPHENHYDRAMINE HCL 25 MG PO CAPS: 25.0000 mg | ORAL_CAPSULE | ORAL | Status: DC

## 2017-03-17 MED ORDER — PREDNISONE 50 MG PO TABS
50.0000 mg | ORAL_TABLET | Freq: Four times a day (QID) | ORAL | Status: AC
  Administered 2017-03-17 – 2017-03-18 (×3): 50 mg via ORAL
  Filled 2017-03-17 (×3): qty 1

## 2017-03-17 NOTE — H&P (View-Only) (Signed)
Progress Note  Patient Name: Tina West Date of Encounter: 03/17/2017  Primary Cardiologist: Dr. Debara Pickett (patient request b/c he is her daughter's cardiologist)  Subjective   Tired.  Didn't sleep well last night.    Inpatient Medications    Scheduled Meds: . atorvastatin  20 mg Oral q1800  . calcium carbonate  1 tablet Oral TID WC  . carvedilol  6.25 mg Oral BID WC  . collagenase   Topical Daily  . feeding supplement (ENSURE ENLIVE)  237 mL Oral TID BM  . furosemide  40 mg Intravenous Daily  . levothyroxine  150 mcg Oral QAC breakfast  . multivitamin with minerals  1 tablet Oral Daily  . sodium chloride flush  3 mL Intravenous Q12H   Continuous Infusions: . sodium chloride    . heparin 1,350 Units/hr (03/16/17 1350)   PRN Meds: sodium chloride, acetaminophen **OR** acetaminophen, ALPRAZolam, alum & mag hydroxide-simeth, ondansetron **OR** ondansetron (ZOFRAN) IV, sodium chloride flush   Vital Signs    Vitals:   03/17/17 0032 03/17/17 0227 03/17/17 0452 03/17/17 0609  BP:  128/66 120/80 110/80  Pulse:  88 86 78  Resp:  18 18 18   Temp:      TempSrc:      SpO2:  100% 100% 100%  Weight: 151 lb 0.2 oz (68.5 kg)     Height:        Intake/Output Summary (Last 24 hours) at 03/17/2017 0849 Last data filed at 03/17/2017 0837 Gross per 24 hour  Intake 540 ml  Output 1450 ml  Net -910 ml   Filed Weights   03/15/17 0546 03/16/17 0253 03/17/17 0032  Weight: 152 lb 16 oz (69.4 kg) 150 lb 9.2 oz (68.3 kg) 151 lb 0.2 oz (68.5 kg)    Telemetry    Atrial fibrillation.  Rate 100s-130s.  Frequent PVCs and NSVT - Personally Reviewed  ECG    n/a - Personally Reviewed  Physical Exam   VS:  BP 110/80 (BP Location: Right Arm)   Pulse (!) 109   Temp (!) 97.5 F (36.4 C) (Axillary)   Resp 18   Ht 5\' 9"  (1.753 m)   Wt 151 lb 0.2 oz (68.5 kg) Comment: bed  SpO2 100%   BMI 22.30 kg/m  , BMI Body mass index is 22.3 kg/m. GENERAL:  Elderly woman.  Frail.  Well  appearing HEENT: Pupils equal round and reactive, fundi not visualized, oral mucosa unremarkable NECK:  +JVD.  waveform within normal limits, carotid upstroke brisk and symmetric, no bruits, no thyromegaly LYMPHATICS:  No cervical adenopathy LUNGS:  Clear to auscultation bilaterally HEART:  Irregularly irregular.  PMI not displaced or sustained,S1 and S2 within normal limits, no S3, no S4, no clicks, no rubs, no murmurs ABD:  Flat, positive bowel sounds normal in frequency in pitch, no bruits, no rebound, no guarding, no midline pulsatile mass, no hepatomegaly, no splenomegaly EXT:  2 plus pulses throughout, no edema, no cyanosis no clubbing SKIN:  No rashes no nodules NEURO:  Cranial nerves II through XII grossly intact, motor grossly intact throughout [facility]:  Cognitively intact, oriented to person place and time   Labs    Chemistry Recent Labs  Lab 03/14/17 1108 03/15/17 0439 03/16/17 0555 03/17/17 0416  NA 134* 131* 134* 132*  K 3.3* 3.9 3.8 4.1  CL 96* 92* 89* 87*  CO2 25 26 33* 33*  GLUCOSE 96 151* 150* 138*  BUN 31* 39* 38* 40*  CREATININE 0.99 0.97 0.76 0.68  CALCIUM 8.3* 8.9 9.0 9.0  PROT 5.7*  --  5.7*  --   ALBUMIN 3.2*  --  3.3*  --   AST 68*  --  52*  --   ALT 85*  --  75*  --   ALKPHOS 62  --  78  --   BILITOT 1.1  --  1.2  --   GFRNONAA 50* 51* >60 >60  GFRAA 58* 60* >60 >60  ANIONGAP 13 13 12 12      Hematology Recent Labs  Lab 03/15/17 0439 03/16/17 0555 03/17/17 0416  WBC 12.5* 13.1* 13.1*  RBC 4.40 4.46 4.42  HGB 11.2* 11.3* 11.2*  HCT 36.7 37.5 37.0  MCV 83.4 84.1 83.7  MCH 25.5* 25.3* 25.3*  MCHC 30.5 30.1 30.3  RDW 15.9* 15.7* 15.6*  PLT 660* 615* 586*    Cardiac Enzymes Recent Labs  Lab 03/12/17 1524 03/12/17 1936 03/12/17 2335 03/13/17 0731  TROPONINI 0.29* 0.29* 0.25* 0.27*    Recent Labs  Lab 03/12/17 1148  TROPIPOC 0.31*     BNP Recent Labs  Lab 03/12/17 2335  BNP 1,846.7*     DDimer No results for input(s):  DDIMER in the last 168 hours.   Radiology    No results found.  Cardiac Studies   ECHO: 03/13/2017 Study Conclusions - Left ventricle: The cavity size was mildly dilated. There was moderate concentric hypertrophy. Systolic function was severely reduced. The estimated ejection fraction was in the range of 15% to 20%. Diffuse hypokinesis. The study was not technically sufficient to allow evaluation of LV diastolic dysfunction due to atrial fibrillation. - Ventricular septum: Septal motion showed paradox. - Aortic valve: Valve mobility was restricted. - Aortic root: The aortic root was normal in size. - Mitral valve: Calcified annulus. Mildly thickened leaflets . There was mild regurgitation. - Left atrium: The atrium was moderately dilated. - Right ventricle: The cavity size was severely dilated. Wall thickness was normal. Systolic function was moderately reduced. - Right atrium: The atrium was moderately dilated. - Tricuspid valve: There was moderate regurgitation. - Pulmonary arteries: Systolic pressure was moderately increased. PA peak pressure: 54 mm Hg (S). - Inferior vena cava: The vessel was dilated. The respirophasic diameter changes were blunted (<50%), consistent with elevated central venous pressure. - Pericardium, extracardiac: There was no pericardial effusion.   Patient Profile     84 y.o. female with hypertension, hypothyroidism and anxiety admitted with acute systolic and diastolic heart failure and new onset atrial fibrillation.  Assessment & Plan    # Atrial fibrillation:  Ms. Cartaya remains in atrial fibrillation.  Rates are poorly-controlled.  BP is too low to titrate carvedilol.  Will switch to metoprolol tartrate 25mg  bid with plans to consolidate to succinate given her systolic dysfunction.  Continue heparin and will decide on anticoagulation strategy after cath.   # Acute systolic and diastolic heart failure: LVEF 15-20% on  echo.  Planning for [facility] tomorrow.  Daughter is now POA and has consented.  She will need pre-meds for contrast allergy. Continue carvedilol and lasix.  JVD is elevated but she has no lower extremity edema.  BUN is rising but creatinine is stable.  Likely switch lasix to oral tomorrow.  Switching carvedilol to metoprolol as above.  Will need ARB/[place] once HR is better controlled and BP allows.  # NSVT: Plan for RHC/LHC 03/18/16.   For questions or updates, please contact [facility] Please consult [email or web address] for contact info under Cardiology/STEMI.      Signed,  Skeet Latch, MD  03/17/2017, 8:49 AM  '

## 2017-03-17 NOTE — Evaluation (Signed)
Physical Therapy Evaluation Patient Details Name: Stephie Xu MRN: [id number] DOB: [birth year] Today's Date: 03/17/2017   History of Present Illness  84yo presented with LE edema, orthopnea, DOE. Admitted for (new) afib/RVR, volume overload, elevated troponin. HR control has been difficult; responded well to diuresis; echo showed new cardiomyopathy LVEF 15-20%. Plan for [facility]  Clinical Impression  Pt admitted with above diagnosis. Pt currently with functional limitations due to the deficits listed below (see PT Problem List). Pt reports that she has not had sleep since she has been here and it too tired to ambulate but agreeable to standing bedside to familiarize self with use of RW. Pt very weak currently, needing mod A for sit to stand and min A to take several small steps. Anticipate that she will need SNF at d/c and will likely need more encouragement in this plan.  Pt will benefit from skilled PT to increase their independence and safety with mobility to allow discharge to the venue listed below.       Follow Up Recommendations SNF;Supervision/Assistance - 24 hour    Equipment Recommendations  Rolling walker with 5" wheels    Recommendations for Other Services OT consult     Precautions / Restrictions Precautions Precautions: Fall Restrictions Weight Bearing Restrictions: No      Mobility  Bed Mobility Overal bed mobility: Needs Assistance Bed Mobility: Supine to Sit;Sit to Supine     Supine to sit: Min assist;HOB elevated Sit to supine: Min assist   General bed mobility comments: vc's for use of rail, min A for LE's off bed and hips to edge as pt elevated trunk. Min A for LE's back into bed with return to supine. Pt able to manage upper body  Transfers Overall transfer level: Needs assistance Equipment used: Rolling walker (2 wheeled) Transfers: Sit to/from Stand Sit to Stand: Mod assist         General transfer comment: vc's for hand placement. Mod A for  power up  Ambulation/Gait Ambulation/Gait assistance: Min assist Ambulation Distance (Feet): 2 Feet Assistive device: Rolling walker (2 wheeled) Gait Pattern/deviations: Step-to pattern     General Gait Details: pt did not want to ambulate but agreeable to practicing stepping edge of bed to get used to RW. Able to step a couple of feet fwd then back but min A needed to maintain balance. Very small steps  Stairs            Wheelchair Mobility    Modified Rankin (Stroke Patients Only)       Balance Overall balance assessment: Needs assistance Sitting-balance support: Single extremity supported Sitting balance-Leahy Scale: Fair     Standing balance support: Bilateral upper extremity supported Standing balance-Leahy Scale: Poor Standing balance comment: support and min A needed to maintain standing                             Pertinent Vitals/Pain Pain Assessment: Faces Faces Pain Scale: Hurts little more Pain Location: back Pain Descriptors / Indicators: Aching;Sore Pain Intervention(s): Limited activity within patient's tolerance;Monitored during session    [place] expects to be discharged to:: Private residence Living Arrangements: Children Available Help at Discharge: Family Type of Home: Apartment         Home Equipment: None Additional Comments: Pt is a rather poor historian but sounds like she lives with a son or grandson and she cares for him more than he cares for her. She reports that one  of her children had asked a neighbor to take her to the store. She was using crutches at the time for ambulation and he took the crutches and broke them. Hard to follow her stories but none seem to demonstrate support in her home environment    Prior Function Level of Independence: Needs assistance   Gait / Transfers Assistance Needed: had been ambulating short distances with crutches  ADL's / Homemaking Assistance Needed: unsure         Hand Dominance        Extremity/Trunk Assessment   Upper Extremity Assessment Upper Extremity Assessment: Generalized weakness    Lower Extremity Assessment Lower Extremity Assessment: Generalized weakness    Cervical / Trunk Assessment Cervical / Trunk Assessment: Kyphotic  Communication   Communication: No difficulties  Cognition Arousal/Alertness: Awake/alert Behavior During Therapy: WFL for tasks assessed/performed Overall Cognitive Status: No family/caregiver present to determine baseline cognitive functioning                                 General Comments: low motivation to mobilize, difficulty giving history      General Comments General comments (skin integrity, edema, etc.): discussed idea of SNF for rehab. Pt not readily agreeable but did state that she would go if it would help her    Exercises     Assessment/Plan    PT Assessment Patient needs continued PT services  PT Problem List Decreased strength;Decreased activity tolerance;Decreased balance;Decreased mobility;Decreased knowledge of use of DME;Decreased knowledge of precautions       PT Treatment Interventions DME instruction;Gait training;Functional mobility training;Therapeutic activities;Therapeutic exercise;Balance training;Neuromuscular re-education;Patient/family education    PT Goals (Current goals can be found in the Care Plan section)  Acute Rehab PT Goals Patient Stated Goal: go home PT Goal Formulation: With patient Time For Goal Achievement: 03/31/17 Potential to Achieve Goals: Fair    Frequency Min 2X/week   Barriers to discharge Decreased caregiver support does not appear that she has much support    Co-evaluation               AM-PAC PT "6 Clicks" Daily Activity  Outcome Measure Difficulty turning over in bed (including adjusting bedclothes, sheets and blankets)?: Unable Difficulty moving from lying on back to sitting on the side of the bed? :  Unable Difficulty sitting down on and standing up from a chair with arms (e.g., wheelchair, bedside commode, etc,.)?: Unable Help needed moving to and from a bed to chair (including a wheelchair)?: A Lot Help needed walking in hospital room?: A Lot Help needed climbing 3-5 steps with a railing? : Total 6 Click Score: 8    End of Session   Activity Tolerance: Patient limited by fatigue Patient left: in bed;with bed alarm set;with call bell/phone within reach Nurse Communication: Mobility status PT Visit Diagnosis: Unsteadiness on feet (R26.81);Muscle weakness (generalized) (M62.81);Difficulty in walking, not elsewhere classified (R26.2);Pain Pain - part of body: (back)    Time: 4132-4401 PT Time Calculation (min) (ACUTE ONLY): 30 min   Charges:   PT Evaluation $PT Eval Moderate Complexity: 1 Mod PT Treatments $Therapeutic Activity: 8-22 mins   PT G Codes:        [place] 03/17/2017, 4:39 PM

## 2017-03-17 NOTE — Progress Notes (Signed)
ANTICOAGULATION CONSULT NOTE - Follow Up Consult  Pharmacy Consult for Heparin Indication: atrial fibrillation  Allergies  Allergen Reactions  . Codeine Hives  . Contrast Media [Iodinated Diagnostic Agents] Other (See Comments)    unspecified  . Diazepam Hives  . Meperidine Hcl Other (See Comments)    unspecified  . Morphine Other (See Comments)    "caused heart attack"   Patient Measurements: Height: 5\' 9"  (175.3 cm) Weight: 151 lb 0.2 oz (68.5 kg)(bed) IBW/kg (Calculated) : 66.2  Vital Signs: Temp: 98.2 F (36.8 C) (01/01 1153) Temp Source: Oral (01/01 1153) BP: 107/59 (01/01 1745) Pulse Rate: 72 (01/01 1745)  Labs: Recent Labs    03/15/17 0439 03/16/17 0555 03/17/17 0416 03/17/17 1837  HGB 11.2* 11.3* 11.2*  --   HCT 36.7 37.5 37.0  --   PLT 660* 615* 586*  --   HEPARINUNFRC 0.43 0.31 0.75* 0.70  CREATININE 0.97 0.76 0.68  --    Estimated Creatinine Clearance: 52.8 mL/min (by C-G formula based on SCr of 0.68 mg/dL).  Medications:  Heparin @ 1250 units/hr  Assessment: Tina West continues on heparin for new afib with plan for possible cath on 1/3. Heparin level is now high therapeutic at 0.7. CBC stable. No bleeding or infusion issues reported by RN.  Goal of Therapy:  Heparin level 0.3-0.7 units/ml Monitor platelets by anticoagulation protocol: Yes   Plan:  1) Reduce heparin gtt slightly to 1200 units/hr 2) Monitor daily heparin level and CBC, s/sx bleeding 3) Follow up plan for cath  Albertina Parr, PharmD., BCPS Clinical Pharmacist Pager [phone]

## 2017-03-17 NOTE — Progress Notes (Addendum)
PROGRESS NOTE  Tina West ZOX:[id number] DOB: 1929/01/17 DOA: 03/12/2017 PCP: Claretta Fraise, MD  Brief Narrative: 88yow presented with LE edema, orthopnea, DOE. Admitted for (new) afib/RVR, volume overload, elevated troponin. HR control has been difficult; responded well to diuresis; echo showed new cardiomyopathy LVEF 15-20%. Plan for [facility].  Assessment/Plan Afib RVR, new onset with associated demand ischemia. CHA2DS2-VASc 5. - rate control difficult; cardiology changed to metoprolol. Continues on heparin. - Needs anticoagulation after cath but financial limitations may limit NOAC use and non-compliance makes warfarin suboptimal. CM consulted.  New cardiomyopathy LVEF 81-19%, acute systolic CHF (new), acute on chronic diastolic CHF with anasarca and associated demand ischemia  - continues to improve; minimal right pedal edema now. clinically much improved; BLE edema nearly resolved  - continue BB - R/LHC 1/2 - add ACE-I if BP can tolerate once HR better controlled  Possible cellulitis left foot with ulcerations noted present on admission (loook like secondary to ruptured bulla but do suspect PAD). No evidence of acute limb ischemia. - appreciate VVS eval   - Keflex completed. Cellulitis resolved.  Bilateral moderate peripheral vascular disease with non-compressible tibilal arteries - VVS recs:  Pt is NOT a surgical candidate at this point with acute changes in cardiac function - At this point, manage the feet with wound care:  Santyl as currently ordered  Compression once necrotic skin debrided  Dr. Jess Barters office will contact patient to arrange for outpatient follow-up for silver impregnated compression stockings (have been in contact with scheduler [place]) - Reconsult VVS once cardiac status stable   Hypothyroidism, TSH 32.9, off outpatient meds - continue levothyroxine @ 137 mcg 1/1.  Thrombocytosis, chronic - trending down; f/u as an outpatient - B12 WNL,  RBC folate pending  On CT small amount of retroperitoneal edema inferior to the duodenum and pancreatic head. Duodenitis/peptic ulcer disease could cause this appearance. Groove pancreatitis would be another consideration. - lipase WNL. Elevated transaminases, suspect secondary to heart failure--trending down; no further eval suggested.  Aortic atherosclerosis  - continue statin  Severe malnutrition - supplements per dietician 12/31  DVT prophylaxis: heparin gtt Code Status: full Family Communication: son, daughter at bedside 1/1 Disposition Plan: home    Murray Hodgkins, MD  [facility] Direct contact: [phone] --[place] [place]  --[email or web address]; password [email or web address]  7PM-7AM contact night coverage as above 03/17/2017, 11:37 AM  LOS: 5 days   Consultants:   Cardiology   VVS   Procedures:  Echo Study Conclusions  - Left ventricle: The cavity size was mildly dilated. There was   moderate concentric hypertrophy. Systolic function was severely   reduced. The estimated ejection fraction was in the range of 15%   to 20%. Diffuse hypokinesis. The study was not technically   sufficient to allow evaluation of LV diastolic dysfunction due to   atrial fibrillation. - Ventricular septum: Septal motion showed paradox. - Aortic valve: Valve mobility was restricted. - Aortic root: The aortic root was normal in size. - Mitral valve: Calcified annulus. Mildly thickened leaflets .   There was mild regurgitation. - Left atrium: The atrium was moderately dilated. - Right ventricle: The cavity size was severely dilated. Wall   thickness was normal. Systolic function was moderately reduced. - Right atrium: The atrium was moderately dilated. - Tricuspid valve: There was moderate regurgitation. - Pulmonary arteries: Systolic pressure was moderately increased.   PA peak pressure: 54 mm Hg (S). - Inferior vena cava: The vessel was dilated. The respirophasic   diameter changes were  blunted (< 50%), consistent with elevated   central venous pressure. - Pericardium, extracardiac: There was no pericardial effusion.  Antimicrobials:  Cefazolin 12/27 >> 12/30  Keflex 12/30 >> 12/31  Interval history/Subjective: Multiple episodes of NSVT Cardiology plans [facility] tomorrow.  Patient feels ok, eating better. Still SOB at night.  Objective: Vitals:  Vitals:   03/17/17 0609 03/17/17 1042  BP: 110/80   Pulse: 78 (!) 109  Resp: 18   Temp:    SpO2: 100%     Exam:  Constitutional:   . Appears calm and comfortable, eating lunch Respiratory:  . CTA bilaterally, no w/r/r.  . Respiratory effort normal.  Cardiovascular:  . irregular, no m/r/g . Trace RLE pedal edema; LLE edema has resolved.   Psychiatric:  . Mental status o Mood, affect appropriate  I have personally reviewed the following:  Filed Weights   03/15/17 0546 03/16/17 0253 03/17/17 0032  Weight: 69.4 kg (152 lb 16 oz) 68.3 kg (150 lb 9.2 oz) 68.5 kg (151 lb 0.2 oz)   Weight change: 0.2 kg (7.1 oz) UOP 1450 -3.4 L since admit BM x2  Labs:  BMP unremarkable 1/1  Transaminases trending down  WBC 13  Hgb stable 11.3 >> 11.2  Plts trending down 615 >> 586  Scheduled Meds: . atorvastatin  20 mg Oral q1800  . calcium carbonate  1 tablet Oral TID WC  . collagenase   Topical Daily  . [START ON 03/18/2017] diphenhydrAMINE  25 mg Oral Pre-Cath   Or  . [START ON 03/18/2017] diphenhydrAMINE  25 mg Intravenous Pre-Cath  . feeding supplement (ENSURE ENLIVE)  237 mL Oral TID BM  . furosemide  40 mg Intravenous Daily  . levothyroxine  150 mcg Oral QAC breakfast  . metoprolol tartrate  50 mg Oral BID  . multivitamin with minerals  1 tablet Oral Daily  . predniSONE  50 mg Oral Q6H  . sodium chloride flush  3 mL Intravenous Q12H   Continuous Infusions: . sodium chloride    . heparin 1,250 Units/hr (03/17/17 1002)    Principal Problem:   A-fib ([place]) Active Problems:   Essential hypertension    Left leg cellulitis   Hypothyroidism   Demand ischemia (HCC)   Acute on chronic diastolic (congestive) heart failure (HCC)   Thrombocytosis (HCC)   Protein-calorie malnutrition, severe   Acute systolic CHF (congestive heart failure) (HCC)   PAD (peripheral artery disease) ([place])   LOS: 5 days

## 2017-03-17 NOTE — Progress Notes (Signed)
Pt slept on and off overnight, throughout the night pt has two episodes of V-tach but asymptomatic. Vitals stable, Cardiologist aware, will continue to monitor the patient

## 2017-03-17 NOTE — Progress Notes (Signed)
Pt has a 23 beats of V-tach at 02.24am, RN checked patient she was trying to change the position, vitals checked and recorded and stable, MD notified

## 2017-03-17 NOTE — Progress Notes (Signed)
ANTICOAGULATION CONSULT NOTE - Follow Up Consult  Pharmacy Consult for Heparin Indication: atrial fibrillation  Allergies  Allergen Reactions  . Codeine Hives  . Contrast Media [Iodinated Diagnostic Agents] Other (See Comments)    unspecified  . Diazepam Hives  . Meperidine Hcl Other (See Comments)    unspecified  . Morphine Other (See Comments)    "caused heart attack"   Patient Measurements: Height: 5\' 9"  (175.3 cm) Weight: 151 lb 0.2 oz (68.5 kg)(bed) IBW/kg (Calculated) : 66.2  Vital Signs: BP: 110/80 (01/01 0609) Pulse Rate: 78 (01/01 0609)  Labs: Recent Labs    03/15/17 0439 03/16/17 0555 03/17/17 0416  HGB 11.2* 11.3* 11.2*  HCT 36.7 37.5 37.0  PLT 660* 615* 586*  HEPARINUNFRC 0.43 0.31 0.75*  CREATININE 0.97 0.76 0.68   Estimated Creatinine Clearance: 52.8 mL/min (by C-G formula based on SCr of 0.68 mg/dL).  Medications:  Heparin @ 1350 units/hr  Assessment: 88yof continues on heparin for new afib with plan for possible cath on 1/3. Heparin level 0.75 is slightly above goal after being therapeutic. CBC stable. No bleeding or infusion issues reported by RN.  Goal of Therapy:  Heparin level 0.3-0.7 units/ml Monitor platelets by anticoagulation protocol: Yes   Plan:  1) Reduce heparin gtt to 1250 units/hr 2) Monitor daily heparin level and CBC, s/sx bleeding 3) Follow up plan for cath   Georga Bora, PharmD Clinical Pharmacist 03/17/2017 8:57 AM

## 2017-03-17 NOTE — Progress Notes (Signed)
Pt is running multiple runs of V-tach this morning, pt is asymptomatic , vitals stable, cardiologist informed, will continue to monitor the patient  Palma Holter, RN

## 2017-03-17 NOTE — Progress Notes (Signed)
Progress Note  Patient Name: Tina West Date of Encounter: 03/17/2017  Primary Cardiologist: Dr. Debara Pickett (patient request b/c he is her daughter's cardiologist)  Subjective   Tired.  Didn't sleep well last night.    Inpatient Medications    Scheduled Meds: . atorvastatin  20 mg Oral q1800  . calcium carbonate  1 tablet Oral TID WC  . carvedilol  6.25 mg Oral BID WC  . collagenase   Topical Daily  . feeding supplement (ENSURE ENLIVE)  237 mL Oral TID BM  . furosemide  40 mg Intravenous Daily  . levothyroxine  150 mcg Oral QAC breakfast  . multivitamin with minerals  1 tablet Oral Daily  . sodium chloride flush  3 mL Intravenous Q12H   Continuous Infusions: . sodium chloride    . heparin 1,350 Units/hr (03/16/17 1350)   PRN Meds: sodium chloride, acetaminophen **OR** acetaminophen, ALPRAZolam, alum & mag hydroxide-simeth, ondansetron **OR** ondansetron (ZOFRAN) IV, sodium chloride flush   Vital Signs    Vitals:   03/17/17 0032 03/17/17 0227 03/17/17 0452 03/17/17 0609  BP:  128/66 120/80 110/80  Pulse:  88 86 78  Resp:  18 18 18   Temp:      TempSrc:      SpO2:  100% 100% 100%  Weight: 151 lb 0.2 oz (68.5 kg)     Height:        Intake/Output Summary (Last 24 hours) at 03/17/2017 0849 Last data filed at 03/17/2017 0837 Gross per 24 hour  Intake 540 ml  Output 1450 ml  Net -910 ml   Filed Weights   03/15/17 0546 03/16/17 0253 03/17/17 0032  Weight: 152 lb 16 oz (69.4 kg) 150 lb 9.2 oz (68.3 kg) 151 lb 0.2 oz (68.5 kg)    Telemetry    Atrial fibrillation.  Rate 100s-130s.  Frequent PVCs and NSVT - Personally Reviewed  ECG    n/a - Personally Reviewed  Physical Exam   VS:  BP 110/80 (BP Location: Right Arm)   Pulse (!) 109   Temp (!) 97.5 F (36.4 C) (Axillary)   Resp 18   Ht 5\' 9"  (1.753 m)   Wt 151 lb 0.2 oz (68.5 kg) Comment: bed  SpO2 100%   BMI 22.30 kg/m  , BMI Body mass index is 22.3 kg/m. GENERAL:  Elderly woman.  Frail.  Well  appearing HEENT: Pupils equal round and reactive, fundi not visualized, oral mucosa unremarkable NECK:  +JVD.  waveform within normal limits, carotid upstroke brisk and symmetric, no bruits, no thyromegaly LYMPHATICS:  No cervical adenopathy LUNGS:  Clear to auscultation bilaterally HEART:  Irregularly irregular.  PMI not displaced or sustained,S1 and S2 within normal limits, no S3, no S4, no clicks, no rubs, no murmurs ABD:  Flat, positive bowel sounds normal in frequency in pitch, no bruits, no rebound, no guarding, no midline pulsatile mass, no hepatomegaly, no splenomegaly EXT:  2 plus pulses throughout, no edema, no cyanosis no clubbing SKIN:  No rashes no nodules NEURO:  Cranial nerves II through XII grossly intact, motor grossly intact throughout [facility]:  Cognitively intact, oriented to person place and time   Labs    Chemistry Recent Labs  Lab 03/14/17 1108 03/15/17 0439 03/16/17 0555 03/17/17 0416  NA 134* 131* 134* 132*  K 3.3* 3.9 3.8 4.1  CL 96* 92* 89* 87*  CO2 25 26 33* 33*  GLUCOSE 96 151* 150* 138*  BUN 31* 39* 38* 40*  CREATININE 0.99 0.97 0.76 0.68  CALCIUM 8.3* 8.9 9.0 9.0  PROT 5.7*  --  5.7*  --   ALBUMIN 3.2*  --  3.3*  --   AST 68*  --  52*  --   ALT 85*  --  75*  --   ALKPHOS 62  --  78  --   BILITOT 1.1  --  1.2  --   GFRNONAA 50* 51* >60 >60  GFRAA 58* 60* >60 >60  ANIONGAP 13 13 12 12      Hematology Recent Labs  Lab 03/15/17 0439 03/16/17 0555 03/17/17 0416  WBC 12.5* 13.1* 13.1*  RBC 4.40 4.46 4.42  HGB 11.2* 11.3* 11.2*  HCT 36.7 37.5 37.0  MCV 83.4 84.1 83.7  MCH 25.5* 25.3* 25.3*  MCHC 30.5 30.1 30.3  RDW 15.9* 15.7* 15.6*  PLT 660* 615* 586*    Cardiac Enzymes Recent Labs  Lab 03/12/17 1524 03/12/17 1936 03/12/17 2335 03/13/17 0731  TROPONINI 0.29* 0.29* 0.25* 0.27*    Recent Labs  Lab 03/12/17 1148  TROPIPOC 0.31*     BNP Recent Labs  Lab 03/12/17 2335  BNP 1,846.7*     DDimer No results for input(s):  DDIMER in the last 168 hours.   Radiology    No results found.  Cardiac Studies   ECHO: 03/13/2017 Study Conclusions - Left ventricle: The cavity size was mildly dilated. There was moderate concentric hypertrophy. Systolic function was severely reduced. The estimated ejection fraction was in the range of 15% to 20%. Diffuse hypokinesis. The study was not technically sufficient to allow evaluation of LV diastolic dysfunction due to atrial fibrillation. - Ventricular septum: Septal motion showed paradox. - Aortic valve: Valve mobility was restricted. - Aortic root: The aortic root was normal in size. - Mitral valve: Calcified annulus. Mildly thickened leaflets . There was mild regurgitation. - Left atrium: The atrium was moderately dilated. - Right ventricle: The cavity size was severely dilated. Wall thickness was normal. Systolic function was moderately reduced. - Right atrium: The atrium was moderately dilated. - Tricuspid valve: There was moderate regurgitation. - Pulmonary arteries: Systolic pressure was moderately increased. PA peak pressure: 54 mm Hg (S). - Inferior vena cava: The vessel was dilated. The respirophasic diameter changes were blunted (<50%), consistent with elevated central venous pressure. - Pericardium, extracardiac: There was no pericardial effusion.   Patient Profile     84 y.o. female with hypertension, hypothyroidism and anxiety admitted with acute systolic and diastolic heart failure and new onset atrial fibrillation.  Assessment & Plan    # Atrial fibrillation:  Ms. Adami remains in atrial fibrillation.  Rates are poorly-controlled.  BP is too low to titrate carvedilol.  Will switch to metoprolol tartrate 25mg  bid with plans to consolidate to succinate given her systolic dysfunction.  Continue heparin and will decide on anticoagulation strategy after cath.   # Acute systolic and diastolic heart failure: LVEF 15-20% on  echo.  Planning for [facility] tomorrow.  Daughter is now POA and has consented.  She will need pre-meds for contrast allergy. Continue carvedilol and lasix.  JVD is elevated but she has no lower extremity edema.  BUN is rising but creatinine is stable.  Likely switch lasix to oral tomorrow.  Switching carvedilol to metoprolol as above.  Will need ARB/[place] once HR is better controlled and BP allows.  # NSVT: Plan for RHC/LHC 03/18/16.   For questions or updates, please contact [place] Please consult [email or web address] for contact info under Cardiology/STEMI.      Signed,  Skeet Latch, MD  03/17/2017, 8:49 AM  '

## 2017-03-18 ENCOUNTER — Encounter (HOSPITAL_COMMUNITY)
Admission: EM | Disposition: A | Discharge status: Skilled Nursing Facility | Payer: Self-pay | Source: Home / Self Care | Attending: Internal Medicine

## 2017-03-18 ENCOUNTER — Encounter (HOSPITAL_COMMUNITY): Payer: Self-pay | Admitting: Interventional Cardiology

## 2017-03-18 ENCOUNTER — Telehealth (INDEPENDENT_AMBULATORY_CARE_PROVIDER_SITE_OTHER): Payer: Self-pay | Admitting: Radiology

## 2017-03-18 HISTORY — PX: RIGHT/LEFT HEART CATH AND CORONARY ANGIOGRAPHY: CATH118266

## 2017-03-18 LAB — PROTIME-INR
INR: 1.04
PROTHROMBIN TIME: 13.5 s (ref 11.4–15.2)

## 2017-03-18 LAB — POCT I-STAT 3, ART BLOOD GAS (G3+)
Acid-Base Excess: 9 mmol/L — ABNORMAL HIGH (ref 0.0–2.0)
Bicarbonate: 33.2 mmol/L — ABNORMAL HIGH (ref 20.0–28.0)
O2 Saturation: 99 %
PCO2 ART: 42.7 mmHg (ref 32.0–48.0)
TCO2: 34 mmol/L — ABNORMAL HIGH (ref 22–32)
pH, Arterial: 7.499 — ABNORMAL HIGH (ref 7.350–7.450)
pO2, Arterial: 107 mmHg (ref 83.0–108.0)

## 2017-03-18 LAB — POCT I-STAT 3, VENOUS BLOOD GAS (G3P V)
Acid-Base Excess: 9 mmol/L — ABNORMAL HIGH (ref 0.0–2.0)
Bicarbonate: 34.5 mmol/L — ABNORMAL HIGH (ref 20.0–28.0)
O2 Saturation: 45 %
PH VEN: 7.435 — AB (ref 7.250–7.430)
PO2 VEN: 25 mmHg — AB (ref 32.0–45.0)
TCO2: 36 mmol/L — ABNORMAL HIGH (ref 22–32)
pCO2, Ven: 51.4 mmHg (ref 44.0–60.0)

## 2017-03-18 LAB — FOLATE RBC
Folate, Hemolysate: 431.7 ng/mL
Folate, RBC: 1173 ng/mL (ref >498)
HEMATOCRIT: 36.8 % (ref 34.0–46.6)

## 2017-03-18 LAB — HEPARIN LEVEL (UNFRACTIONATED): Heparin Unfractionated: 0.9 IU/mL — ABNORMAL HIGH (ref 0.30–0.70)

## 2017-03-18 SURGERY — RIGHT/LEFT HEART CATH AND CORONARY ANGIOGRAPHY
Anesthesia: LOCAL

## 2017-03-18 MED ORDER — SODIUM CHLORIDE 0.9% FLUSH: 3.0000 mL | Freq: Two times a day (BID) | INTRAVENOUS | Status: DC

## 2017-03-18 MED ORDER — LIDOCAINE HCL (PF) 1 % IJ SOLN
INTRAMUSCULAR | Status: AC
  Filled 2017-03-18: qty 30

## 2017-03-18 MED ORDER — SODIUM CHLORIDE 0.9% FLUSH
3.0000 mL | Freq: Two times a day (BID) | INTRAVENOUS | Status: DC
  Administered 2017-03-18 – 2017-03-25 (×7): 3 mL via INTRAVENOUS

## 2017-03-18 MED ORDER — SODIUM CHLORIDE 0.9 % IV SOLN: INTRAVENOUS | Status: DC

## 2017-03-18 MED ORDER — SODIUM CHLORIDE 0.9 % IV SOLN: 250.0000 mL | INTRAVENOUS | Status: DC | PRN

## 2017-03-18 MED ORDER — IOPAMIDOL (ISOVUE-370) INJECTION 76%
INTRAVENOUS | Status: AC
  Filled 2017-03-18: qty 100

## 2017-03-18 MED ORDER — ASPIRIN 81 MG PO CHEW: 81.0000 mg | CHEWABLE_TABLET | ORAL | Status: DC

## 2017-03-18 MED ORDER — VERAPAMIL HCL 2.5 MG/ML IV SOLN
INTRAVENOUS | Status: DC | PRN
  Administered 2017-03-18: 10 mL via INTRA_ARTERIAL

## 2017-03-18 MED ORDER — IOPAMIDOL (ISOVUE-370) INJECTION 76%
INTRAVENOUS | Status: DC | PRN
  Administered 2017-03-18: 40 mL via INTRA_ARTERIAL

## 2017-03-18 MED ORDER — HEPARIN (PORCINE) IN NACL 100-0.45 UNIT/ML-% IJ SOLN
1050.0000 [IU]/h | INTRAMUSCULAR | Status: DC
  Administered 2017-03-18 – 2017-03-20 (×3): 1000 [IU]/h via INTRAVENOUS
  Administered 2017-03-21 – 2017-03-23 (×2): 900 [IU]/h via INTRAVENOUS
  Administered 2017-03-24 – 2017-03-26 (×3): 1050 [IU]/h via INTRAVENOUS
  Filled 2017-03-18 (×7): qty 250

## 2017-03-18 MED ORDER — HEPARIN SODIUM (PORCINE) 1000 UNIT/ML IJ SOLN
INTRAMUSCULAR | Status: AC
  Filled 2017-03-18: qty 1

## 2017-03-18 MED ORDER — SODIUM CHLORIDE 0.9 % IV SOLN
INTRAVENOUS | Status: DC
  Administered 2017-03-18: 07:00:00 via INTRAVENOUS

## 2017-03-18 MED ORDER — HEPARIN (PORCINE) IN NACL 2-0.9 UNIT/ML-% IJ SOLN
INTRAMUSCULAR | Status: AC | PRN
  Administered 2017-03-18: 1000 mL

## 2017-03-18 MED ORDER — ASPIRIN 81 MG PO CHEW
81.0000 mg | CHEWABLE_TABLET | ORAL | Status: AC
  Administered 2017-03-18: 81 mg via ORAL
  Filled 2017-03-18: qty 1

## 2017-03-18 MED ORDER — HEPARIN (PORCINE) IN NACL 2-0.9 UNIT/ML-% IJ SOLN
INTRAMUSCULAR | Status: AC
  Filled 2017-03-18: qty 1000

## 2017-03-18 MED ORDER — SODIUM CHLORIDE 0.9% FLUSH: 3.0000 mL | INTRAVENOUS | Status: DC | PRN

## 2017-03-18 MED ORDER — HEPARIN SODIUM (PORCINE) 1000 UNIT/ML IJ SOLN
INTRAMUSCULAR | Status: DC | PRN
  Administered 2017-03-18: 3500 [IU] via INTRAVENOUS

## 2017-03-18 MED ORDER — VERAPAMIL HCL 2.5 MG/ML IV SOLN
INTRAVENOUS | Status: AC
  Filled 2017-03-18: qty 2

## 2017-03-18 MED ORDER — FUROSEMIDE 40 MG PO TABS
40.0000 mg | ORAL_TABLET | Freq: Every day | ORAL | Status: DC
  Administered 2017-03-19 – 2017-03-26 (×8): 40 mg via ORAL
  Filled 2017-03-18 (×9): qty 1

## 2017-03-18 MED ORDER — DIPHENHYDRAMINE HCL 50 MG/ML IJ SOLN: 25.0000 mg | INTRAMUSCULAR | Status: AC

## 2017-03-18 MED ORDER — LIDOCAINE HCL (PF) 1 % IJ SOLN
INTRAMUSCULAR | Status: DC | PRN
  Administered 2017-03-18 (×2): 2 mL

## 2017-03-18 MED ORDER — DIPHENHYDRAMINE HCL 25 MG PO CAPS
25.0000 mg | ORAL_CAPSULE | ORAL | Status: AC
  Administered 2017-03-18: 25 mg via ORAL
  Filled 2017-03-18: qty 1

## 2017-03-18 SURGICAL SUPPLY — 14 items
CATH BALLN WEDGE 5F 110CM (CATHETERS) ×2 IMPLANT
CATH INFINITI 5 FR JL3.5 (CATHETERS) ×2 IMPLANT
CATH INFINITI JR4 5F (CATHETERS) ×2 IMPLANT
DEVICE RAD TR BAND REGULAR (VASCULAR PRODUCTS) ×2 IMPLANT
GLIDESHEATH SLEND SS 6F .021 (SHEATH) ×2 IMPLANT
GUIDEWIRE INQWIRE 1.5J.035X260 (WIRE) ×1 IMPLANT
INQWIRE 1.5J .035X260CM (WIRE) ×2
KIT HEART LEFT (KITS) ×2 IMPLANT
PACK CARDIAC CATHETERIZATION (CUSTOM PROCEDURE TRAY) ×2 IMPLANT
SHEATH GLIDE SLENDER 4/5FR (SHEATH) ×2 IMPLANT
TRANSDUCER W/STOPCOCK (MISCELLANEOUS) ×2 IMPLANT
TUBING CIL FLEX 10 FLL-RA (TUBING) ×2 IMPLANT
WIRE ASAHI PROWATER 180CM (WIRE) ×2 IMPLANT
WIRE HI TORQ VERSACORE-J 145CM (WIRE) ×2 IMPLANT

## 2017-03-18 NOTE — Interval H&P Note (Signed)
Cath Lab Visit (complete for each Cath Lab visit)  Clinical Evaluation Leading to the Procedure:   ACS: Yes.    Non-ACS:    Anginal Classification: CCS IV  Anti-ischemic medical therapy: Minimal Therapy (1 class of medications)  Non-Invasive Test Results: High-risk stress test findings: cardiac mortality >3%/year  EF 15-20%  Prior CABG: No previous CABG      History and Physical Interval Note:  03/18/2017 1:53 PM  Leana [place]ston  has presented today for surgery, with the diagnosis of cp  The various methods of treatment have been discussed with the patient and family. After consideration of risks, benefits and other options for treatment, the patient has consented to  Procedure(s): RIGHT/LEFT HEART CATH AND CORONARY ANGIOGRAPHY (N/A) as a surgical intervention .  The patient's history has been reviewed, patient examined, no change in status, stable for surgery.  I have reviewed the patient's chart and labs.  Questions were answered to the patient's satisfaction.     Larae Grooms

## 2017-03-18 NOTE — Care Management Important Message (Signed)
Important Message  Patient Details  Name: Tina West MRN: [id number] Date of Birth: 20-Mar-1928   Medicare Important Message Given:  Yes    Verdis Bassette 03/18/2017, 9:27 AM

## 2017-03-18 NOTE — Clinical Social Work Placement (Signed)
   CLINICAL SOCIAL WORK PLACEMENT  NOTE  Date:  03/18/2017  Patient Details  Name: Tina West MRN: [id number] Date of Birth: 23-Nov-1928  Clinical Social Work is seeking post-discharge placement for this patient at the [place] level of care (*CSW will initial, date and re-position this form in  chart as items are completed):  Yes   Patient/family provided with [place] Work Department's list of facilities offering this level of care within the geographic area requested by the patient (or if unable, by the patient's family).  Yes   Patient/family informed of their freedom to choose among providers that offer the needed level of care, that participate in Medicare, Medicaid or managed care program needed by the patient, have an available bed and are willing to accept the patient.  Yes   Patient/family informed of [place]'s ownership interest in [facility] and [facility], as well as of the fact that they are under no obligation to receive care at these facilities.  PASRR submitted to EDS on 03/18/17     PASRR number received on       Existing PASRR number confirmed on       FL2 transmitted to all facilities in geographic area requested by pt/family on 03/18/17     FL2 transmitted to all facilities within larger geographic area on       Patient informed that his/her managed care company has contracts with or will negotiate with certain facilities, including the following:            Patient/family informed of bed offers received.  Patient chooses bed at       Physician recommends and patient chooses bed at      Patient to be transferred to   on  .  Patient to be transferred to facility by       Patient family notified on   of transfer.  Name of family member notified:        PHYSICIAN Please sign FL2     Additional Comment:    _______________________________________________ Candie Chroman, LCSW 03/18/2017, 12:00  PM

## 2017-03-18 NOTE — Evaluation (Addendum)
Occupational Therapy Evaluation Patient Details Name: Tina West MRN: [id number] DOB: October 27, 1928 Today's Date: 03/18/2017    History of Present Illness 84yo presented with LE edema, orthopnea, DOE. Admitted for (new) afib/RVR, volume overload, elevated troponin. HR control has been difficult; responded well to diuresis; echo showed new cardiomyopathy LVEF 15-20%. S/p RIGHT/LEFT HEART CATH AND CORONARY ANGIOGRAPHY 03/18/17.   Clinical Impression   Limited evaluation completed this session as pt s/p R/L hearth cath and coronary angiography and unsafe to move R UE. Also limited mobility as a precaution this session and cleared with RN for bed level evaluation. Pt able to complete bed level grooming and self-feeding tasks with set-up and UB ADL with mod assist while adhering to precautions. She presents to OT with slight confusion, decreased memory, perseveration on post-cath precautions, decreased awareness, and decreased problem solving abilities. Pt lives with her son who is able to provide only some assistance to pt. She has been having increasing difficulty with ADL as well as ambulating short distances with crutches. Feel, pt will need SNF level rehabilitation post-acute D/C prior to returning home as she is unsafe due to cognitive deficits unless able to demonstrate significant progress acutely. Will continue to follow and progress with mobility next session.     Follow Up Recommendations  SNF;Supervision/Assistance - 24 hour    Equipment Recommendations  Other (comment)(TBD at next venue of care)    Recommendations for Other Services       Precautions / Restrictions Precautions Precautions: Fall Restrictions Weight Bearing Restrictions: No      Mobility Bed Mobility               General bed mobility comments: Deffered to ensure no movement of R UE post-cath  Transfers                      Balance                                            ADL either performed or assessed with clinical judgement   ADL Overall ADL's : Needs assistance/impaired Eating/Feeding: Set up;Bed level   Grooming: Set up;Bed level   Upper Body Bathing: Minimal assistance;Bed level   Lower Body Bathing: Maximal assistance;Bed level   Upper Body Dressing : Minimal assistance;Bed level   Lower Body Dressing: Maximal assistance;Bed level                 General ADL Comments: Pt agreeable to participate in strength testing and ADL at bed level. Limited this session as pt has recently returned from cath lab and restricted to no movement of R UE at this time.      Vision   Vision Assessment?: No apparent visual deficits Additional Comments: No apparent deficits at this time but will continue to assess.      Perception     Praxis      Pertinent Vitals/Pain Pain Assessment: No/denies pain     Hand Dominance     Extremity/Trunk Assessment Upper Extremity Assessment Upper Extremity Assessment: Generalized weakness   Lower Extremity Assessment Lower Extremity Assessment: Generalized weakness       Communication Communication Communication: No difficulties   Cognition Arousal/Alertness: Awake/alert Behavior During Therapy: WFL for tasks assessed/performed Overall Cognitive Status: Impaired/Different from baseline Area of Impairment: Memory;Following commands;Safety/judgement;Awareness;Problem solving  Memory: Decreased short-term memory Following Commands: Follows one step commands consistently;Follows multi-step commands inconsistently Safety/Judgement: Decreased awareness of safety;Decreased awareness of deficits Awareness: Emergent Problem Solving: Slow processing;Decreased initiation General Comments: Pt tangential in her speech and will begin speaking about one topic and then switch to another story. Perseverative on post-cath precautions.    General Comments  Discussed possibility of SNF  rehab with pt and granddaughter-in-law. Both are agreeable to SNF if able to be close to home because son does not have transporation available to him. They are more open to [facility] therapies but willing to persue SNF if necessary.     Exercises     Shoulder Instructions      Home Living Family/patient expects to be discharged to:: Private residence Living Arrangements: Children Available Help at Discharge: Family(son unable to provide physical assistance) Type of Home: Apartment                       Home Equipment: None   Additional Comments: Need to further assess and verify home information. Lives with son who has multiple health problems and cannot provide physical assistance. She has been ambulating with crutches.       Prior Functioning/Environment Level of Independence: Needs assistance  Gait / Transfers Assistance Needed: had been ambulating short distances with crutches ADL's / Homemaking Assistance Needed: Difficult to determine as pt with decreased ability to accurately report PLOF. She reports that she is mostly independent but that tasks have been more difficult which is corroborated with granddaughter-in-law present for session.             OT Problem List: Decreased strength;Decreased activity tolerance;Impaired balance (sitting and/or standing);Decreased safety awareness;Decreased knowledge of use of DME or AE;Decreased knowledge of precautions;Decreased cognition;Pain      OT Treatment/Interventions: Self-care/ADL training;Therapeutic exercise;Energy conservation;DME and/or AE instruction;Therapeutic activities;Cognitive remediation/compensation;Patient/family education;Balance training    OT Goals(Current goals can be found in the care plan section) Acute Rehab OT Goals Patient Stated Goal: go home OT Goal Formulation: With patient Time For Goal Achievement: 04/01/17 Potential to Achieve Goals: Good ADL Goals Pt Will Perform Grooming: with  supervision;sitting Pt Will Perform Lower Body Dressing: with min assist;sit to/from stand Pt Will Transfer to Toilet: with min assist;ambulating;bedside commode Pt Will Perform Toileting - Clothing Manipulation and hygiene: with min assist;sit to/from stand Additional ADL Goal #1: Pt will demonstrate anticipatory awareness during morning ADL routine with no more than 1 VC.  OT Frequency: Min 2X/week   Barriers to D/C:            Co-evaluation              AM-PAC PT "6 Clicks" Daily Activity     Outcome Measure Help from another person eating meals?: A Little Help from another person taking care of personal grooming?: A Little Help from another person toileting, which includes using toliet, bedpan, or urinal?: Total Help from another person bathing (including washing, rinsing, drying)?: A Lot Help from another person to put on and taking off regular upper body clothing?: A Lot Help from another person to put on and taking off regular lower body clothing?: A Lot 6 Click Score: 13   End of Session Nurse Communication: Mobility status;Other (comment)(RN cleared for bed-level eval post-cath)  Activity Tolerance: Patient tolerated treatment well Patient left: in bed;with call bell/phone within reach;with family/visitor present  OT Visit Diagnosis: Muscle weakness (generalized) (M62.81);Other abnormalities of gait and mobility (R26.89);Other symptoms and signs involving cognitive function  Time: 4008-6761 OT Time Calculation (min): 10 min Charges:  OT General Charges $OT Visit: 1 Visit OT Evaluation $OT Eval Moderate Complexity: 1 Mod G-Codes:     Norman Herrlich, MS OTR/L  Pager: [place] A [place] Sample 03/18/2017, 4:41 PM

## 2017-03-18 NOTE — Progress Notes (Signed)
ANTICOAGULATION CONSULT NOTE - Follow Up Consult  Pharmacy Consult for Heparin Indication: atrial fibrillation  Allergies  Allergen Reactions  . Codeine Hives  . Contrast Media [Iodinated Diagnostic Agents] Other (See Comments)    unspecified  . Diazepam Hives  . Meperidine Hcl Other (See Comments)    unspecified  . Morphine Other (See Comments)    "caused heart attack"   Patient Measurements: Height: 5\' 9"  (175.3 cm) Weight: 152 lb 12.5 oz (69.3 kg) IBW/kg (Calculated) : 66.2  Vital Signs: Temp: 97.6 F (36.4 C) (01/02 0531) Temp Source: Oral (01/02 0531) BP: 91/71 (01/02 0531) Pulse Rate: 97 (01/02 0531)  Labs: Recent Labs    03/16/17 0555 03/17/17 0416 03/17/17 1837 03/18/17 0650  HGB 11.3* 11.2*  --   --   HCT 37.5 37.0  --   --   PLT 615* 586*  --   --   LABPROT  --   --   --  13.5  INR  --   --   --  1.04  HEPARINUNFRC 0.31 0.75* 0.70 0.90*  CREATININE 0.76 0.68  --   --    Estimated Creatinine Clearance: 52.8 mL/min (by C-G formula based on SCr of 0.68 mg/dL).  Medications:  Heparin @ 1200 units/hr  Assessment: 88yof continues on heparin for new afib with plan for cath on 1/2. Heparin level now high, increased at 0.9 after rate decrease. RN unable to verify how lab was drawn. CBC stable. No bleeding or infusion issues reported by RN.  Goal of Therapy:  Heparin level 0.3-0.7 units/ml Monitor platelets by anticoagulation protocol: Yes   Plan:  1) Reduce heparin gtt to 1000 units/hr 2) Monitor daily heparin level and CBC, s/sx bleeding 3) Cath planned 1/2  Elicia Lamp, PharmD, BCPS Clinical Pharmacist Clinical phone for 03/18/2017 until 3:30pm: [phone] If after 3:30pm, please call main pharmacy at: [phone] 03/18/2017 8:42 AM

## 2017-03-18 NOTE — NC FL2 (Signed)
[place] LEVEL OF CARE SCREENING TOOL     IDENTIFICATION  Patient Name: Rogelio Winbush Birthdate: [birth year] Sex: female Admission Date (Current Location): 03/12/2017  [facility] and [place] Number:  Herbalist and Address:  [facility] [place] [address] [place], [place] [address]      Provider Number: [id number]  Attending Physician Name and Address:  Aline August, MD  Relative Name and Phone Number:       Current Level of Care: Hospital Recommended Level of Care: [place] Prior Approval Number:    Date Approved/Denied:   PASRR Number: Manual review  Discharge Plan: SNF    Current Diagnoses: Patient Active Problem List   Diagnosis Date Noted  . PAD (peripheral artery disease) ([place]) 03/16/2017  . Acute systolic CHF (congestive heart failure) ([place]) 03/14/2017  . Demand ischemia ([place]) 03/13/2017  . Acute on chronic diastolic (congestive) heart failure ([place]) 03/13/2017  . Thrombocytosis ([place]) 03/13/2017  . Protein-calorie malnutrition, severe 03/13/2017  . A-fib ([place]) 03/12/2017  . Left leg cellulitis 03/12/2017  . Hypothyroidism 03/12/2017  . Other specified hypothyroidism 01/12/2015  . Alzheimer's dementia 01/12/2015  . CARDIOMYOPATHY, ALCOHOLIC 29/52/8413  . Essential hypertension 04/05/2009  . PERSONAL HISTORY OF SUDDEN CARDIAC ARREST 04/05/2009    Orientation RESPIRATION BLADDER Height & Weight     Self, Situation, Place  O2(Nasal Canula 2 L) Continent, External catheter Weight: 152 lb 12.5 oz (69.3 kg) Height:  5\' 9"  (175.3 cm)  BEHAVIORAL SYMPTOMS/MOOD NEUROLOGICAL BOWEL NUTRITION STATUS  (None) (Alzheimer's) Continent Diet(Currently NPO for procedure. Diet will be on discharge summary.)  AMBULATORY STATUS COMMUNICATION OF NEEDS Skin   Limited Assist Verbally Other (Comment)(Blister, Cellulitis)                       Personal Care Assistance Level of Assistance  Bathing, Feeding,  Dressing Bathing Assistance: Limited assistance Feeding assistance: Limited assistance Dressing Assistance: Limited assistance     Functional Limitations Info  Sight, Hearing, Speech Sight Info: Adequate Hearing Info: Adequate Speech Info: Adequate    SPECIAL CARE FACTORS FREQUENCY  PT (By licensed PT), Blood pressure, OT (By licensed OT)     PT Frequency: 5 x week OT Frequency: 5 x week            Contractures Contractures Info: Not present    Additional Factors Info  Code Status, Allergies Code Status Info: Full Allergies Info: Codeine, Contrast Media (Iodinated Diagnostic Agents), Diazepam, Meperidine Hcl, Morphine           Current Medications (03/18/2017):  This is the current hospital active medication list Current Facility-Administered Medications  Medication Dose Route Frequency Provider Last Rate Last Dose  . 0.9 %  sodium chloride infusion  250 mL Intravenous PRN Rizwan, Saima, MD      . 0.9 %  sodium chloride infusion   Intravenous Continuous Jettie Booze, MD 10 mL/hr at 03/18/17 [phone]    . acetaminophen (TYLENOL) tablet 650 mg  650 mg Oral Q6H PRN Debbe Odea, MD   650 mg at 03/15/17 1027   Or  . acetaminophen (TYLENOL) suppository 650 mg  650 mg Rectal Q6H PRN Debbe Odea, MD      . ALPRAZolam Duanne Moron) tablet 0.5 mg  0.5 mg Oral Daily PRN Samuella Cota, MD   0.5 mg at 03/17/17 1753  . alum & mag hydroxide-simeth (MAALOX/MYLANTA) 200-200-20 MG/5ML suspension 15 mL  15 mL Oral Q6H PRN Samuella Cota,  MD   15 mL at 03/13/17 2030  . atorvastatin (LIPITOR) tablet 20 mg  20 mg Oral q1800 Samuella Cota, MD   20 mg at 03/17/17 1753  . calcium carbonate (TUMS - dosed in mg elemental calcium) chewable tablet 200 mg of elemental calcium  1 tablet Oral TID WC Samuella Cota, MD   200 mg of elemental calcium at 03/18/17 0603  . collagenase (SANTYL) ointment   Topical Daily Samuella Cota, MD      . diphenhydrAMINE (BENADRYL) capsule 25 mg   25 mg Oral Pre-Cath Jettie Booze, MD       Or  . diphenhydrAMINE (BENADRYL) injection 25 mg  25 mg Intravenous Pre-Cath Irish Lack, Charlann Lange, MD      . feeding supplement (ENSURE ENLIVE) (ENSURE ENLIVE) liquid 237 mL  237 mL Oral TID BM Samuella Cota, MD   237 mL at 03/17/17 1004  . [START ON 03/19/2017] furosemide (LASIX) tablet 40 mg  40 mg Oral Daily Kilroy, Luke K, PA-C      . heparin ADULT infusion 100 units/mL (25000 units/24mL sodium chloride 0.45%)  1,000 Units/hr Intravenous Continuous Romona Curls, RPH 10 mL/hr at 03/18/17 0851 1,000 Units/hr at 03/18/17 0851  . levothyroxine (SYNTHROID, LEVOTHROID) tablet 150 mcg  150 mcg Oral QAC breakfast Samuella Cota, MD   150 mcg at 03/18/17 0603  . metoprolol tartrate (LOPRESSOR) tablet 50 mg  50 mg Oral BID Skeet Latch, MD   50 mg at 03/18/17 0844  . multivitamin with minerals tablet 1 tablet  1 tablet Oral Daily Samuella Cota, MD   1 tablet at 03/17/17 1003  . ondansetron (ZOFRAN) tablet 4 mg  4 mg Oral Q6H PRN Debbe Odea, MD       Or  . ondansetron (ZOFRAN) injection 4 mg  4 mg Intravenous Q6H PRN Rizwan, Saima, MD      . sodium chloride flush (NS) 0.9 % injection 3 mL  3 mL Intravenous Q12H Debbe Odea, MD   3 mL at 03/18/17 0845  . sodium chloride flush (NS) 0.9 % injection 3 mL  3 mL Intravenous PRN Debbe Odea, MD         Discharge Medications: Please see discharge summary for a list of discharge medications.  Relevant Imaging Results:  Relevant Lab Results:   Additional Information SS#: [id number]  Candie Chroman, LCSW

## 2017-03-18 NOTE — Progress Notes (Signed)
Progress Note  Patient Name: Tina West Date of Encounter: 03/18/2017  Primary Cardiologist: Dr Debara Pickett  Subjective   Pt denies any SOB "good night" per pt's daughter  Inpatient Medications    Scheduled Meds: . atorvastatin  20 mg Oral q1800  . calcium carbonate  1 tablet Oral TID WC  . collagenase   Topical Daily  . diphenhydrAMINE  25 mg Oral Pre-Cath   Or  . diphenhydrAMINE  25 mg Intravenous Pre-Cath  . feeding supplement (ENSURE ENLIVE)  237 mL Oral TID BM  . furosemide  40 mg Intravenous Daily  . levothyroxine  150 mcg Oral QAC breakfast  . metoprolol tartrate  50 mg Oral BID  . multivitamin with minerals  1 tablet Oral Daily  . sodium chloride flush  3 mL Intravenous Q12H   Continuous Infusions: . sodium chloride    . sodium chloride 10 mL/hr at 03/18/17 6144  . heparin 1,000 Units/hr (03/18/17 0851)   PRN Meds: sodium chloride, acetaminophen **OR** acetaminophen, ALPRAZolam, alum & mag hydroxide-simeth, ondansetron **OR** ondansetron (ZOFRAN) IV, sodium chloride flush   Vital Signs    Vitals:   03/17/17 1745 03/17/17 1926 03/18/17 0531 03/18/17 0842  BP: (!) 107/59 104/62 91/71 109/76  Pulse: 72 79 97 (!) 111  Resp:  18 18   Temp:  97.6 F (36.4 C) 97.6 F (36.4 C)   TempSrc:  Oral Oral   SpO2: 100% 100% 97% 98%  Weight:   152 lb 12.5 oz (69.3 kg)   Height:        Intake/Output Summary (Last 24 hours) at 03/18/2017 1042 Last data filed at 03/18/2017 0845 Gross per 24 hour  Intake 947.8 ml  Output 400 ml  Net 547.8 ml   Filed Weights   03/16/17 0253 03/17/17 0032 03/18/17 0531  Weight: 150 lb 9.2 oz (68.3 kg) 151 lb 0.2 oz (68.5 kg) 152 lb 12.5 oz (69.3 kg)    Telemetry    AF with variable VR. She had bradycardia into the low 40's last PM. She had AF with VR of 100 with 11 bts of NSWCT this am. - Personally Reviewed  ECG    12/291/8- AF with LBBB - Personally Reviewed  Physical Exam   GEN: Elderly frail female, No acute distress.     Neck: No JVD Cardiac: irregularly irregular rythm Respiratory: Kyphosis, decreased breath sounds Lt base GI: Soft, nontender, non-distended  MS: decreased distal pulses. No deformity. Neuro:  Nonfocal  Psych: Normal affect   Labs    Chemistry Recent Labs  Lab 03/14/17 1108 03/15/17 0439 03/16/17 0555 03/17/17 0416  NA 134* 131* 134* 132*  K 3.3* 3.9 3.8 4.1  CL 96* 92* 89* 87*  CO2 25 26 33* 33*  GLUCOSE 96 151* 150* 138*  BUN 31* 39* 38* 40*  CREATININE 0.99 0.97 0.76 0.68  CALCIUM 8.3* 8.9 9.0 9.0  PROT 5.7*  --  5.7*  --   ALBUMIN 3.2*  --  3.3*  --   AST 68*  --  52*  --   ALT 85*  --  75*  --   ALKPHOS 62  --  78  --   BILITOT 1.1  --  1.2  --   GFRNONAA 50* 51* >60 >60  GFRAA 58* 60* >60 >60  ANIONGAP 13 13 12 12      Hematology Recent Labs  Lab 03/15/17 0439 03/16/17 0555 03/17/17 0416  WBC 12.5* 13.1* 13.1*  RBC 4.40 4.46 4.42  HGB 11.2*  11.3* 11.2*  HCT 36.7 37.5 37.0  MCV 83.4 84.1 83.7  MCH 25.5* 25.3* 25.3*  MCHC 30.5 30.1 30.3  RDW 15.9* 15.7* 15.6*  PLT 660* 615* 586*    Cardiac Enzymes Recent Labs  Lab 03/12/17 1524 03/12/17 1936 03/12/17 2335 03/13/17 0731  TROPONINI 0.29* 0.29* 0.25* 0.27*    Recent Labs  Lab 03/12/17 1148  TROPIPOC 0.31*     BNP Recent Labs  Lab 03/12/17 2335  BNP 1,846.7*     DDimer No results for input(s): DDIMER in the last 168 hours.   Radiology    CXR 03/12/17- CHEST  2 VIEW  COMPARISON:  None.  FINDINGS: Cardiomediastinal silhouette enlarged. Interlobular septal thickening.  Low lung volumes with apical kyphotic positioning secondary to kyphotic curvature of the spine.  Lateral view demonstrates small pleural effusions.  IMPRESSION: Small pleural effusions with evidence of mild edema.   Cardiac Studies   Echo 03/13/17- Study Conclusions  - Left ventricle: The cavity size was mildly dilated. There was   moderate concentric hypertrophy. Systolic function was severely    reduced. The estimated ejection fraction was in the range of 15%   to 20%. Diffuse hypokinesis. The study was not technically   sufficient to allow evaluation of LV diastolic dysfunction due to   atrial fibrillation. - Ventricular septum: Septal motion showed paradox. - Aortic valve: Valve mobility was restricted. - Aortic root: The aortic root was normal in size. - Mitral valve: Calcified annulus. Mildly thickened leaflets .   There was mild regurgitation. - Left atrium: The atrium was moderately dilated. - Right ventricle: The cavity size was severely dilated. Wall   thickness was normal. Systolic function was moderately reduced. - Right atrium: The atrium was moderately dilated. - Tricuspid valve: There was moderate regurgitation. - Pulmonary arteries: Systolic pressure was moderately increased.   PA peak pressure: 54 mm Hg (S). - Inferior vena cava: The vessel was dilated. The respirophasic   diameter changes were blunted (< 50%), consistent with elevated   central venous pressure. - Pericardium, extracardiac: There was no pericardial effusion.  LEA doppler 03/14/17 Final Interpretation: Right: Resting right ankle-brachial index indicates noncompressible right lower extremity arteries.The right toe-brachial index is normal. ABIs are unreliable. Waveforms are consistent with moderate disease. Left: Resting left ankle-brachial index indicates noncompressible right lower extremity arteries.The left toe-brachial index is abnormal. ABIs are unreliable. Waveforms are consistent with moderate disease.  Patient Profile     84 y.o. female with hypertension, hypothyroidism, anxiety, and medical non compliance (financial) admitted with acute systolic and diastolic heart failure, new onset atrial fibrillation, cellulitis LLE, and TSH of 32.9.   Assessment & Plan    # Atrial fibrillation:  Ms. Tina West remains in atrial fibrillation.  Rates are poorly-controlled and variable- 40-120.  BP  has been low, hampering medical Rx.  Continue heparin and will decide on anticoagulation strategy after cath.   # Acute systolic and diastolic heart failure: LVEF 15-20% on echo.  Planning for [facility] today.  Daughter is now POA and has consented. Pt pre medicated for contrast allergy. Continue carvedilol and lasix.  BUN is rising but creatinine is stable.  Switch lasix to oral tomorrow.  Will need ARB/Entresot once HR is better controlled and BP allows.  # NSVT: On beta blocker. Plan for RHC/LHC today  # PVD: can evaluate further as an OP once cardiac status is    stabilized.   Plan: cath today. Discussed with pt and daughter. Change Lasix to PO.  For questions or updates, please contact [place] Please consult [email or web address] for contact info under Cardiology/STEMI.      Angelena Form, PA-C  03/18/2017, 10:42 AM    Attending Note:   The patient was seen and examined.  Agree with assessment and plan as noted above.  Changes made to the above note as needed.  Patient seen and independently examined with [place], [place] .   We discussed all aspects of the encounter. I agree with the assessment and plan as stated above.  1.  Chronic systolic congestive heart failure:   The patient's heart cath today reveals some coronary artery disease but not nearly severe enough to explain her markedly depressed left ventricular systolic function.  She has a very challenging social situation and is not able to come to the doctor often and does not take her medications on any regular basis.  2.  Atrial fibrillation: Her heart rates are very variable.  We will try to get her on a DOAC.   I do not think she would be able to be compliant with INR testing   I have spent a total of 40 minutes with patient reviewing hospital  notes , telemetry, EKGs, labs and examining patient as well as establishing an assessment and plan that was discussed with the patient. > 50% of time was spent in direct  patient care.    Thayer Headings, Brooke Bonito., MD, [facility] 03/18/2017, 5:33 PM [address]  [place] Pager [phone]

## 2017-03-18 NOTE — Progress Notes (Signed)
ANTICOAGULATION CONSULT NOTE - Follow Up Consult  Pharmacy Consult:  Heparin Indication: atrial fibrillation  Allergies  Allergen Reactions  . Codeine Hives  . Contrast Media [Iodinated Diagnostic Agents] Other (See Comments)    unspecified  . Diazepam Hives  . Meperidine Hcl Other (See Comments)    unspecified  . Morphine Other (See Comments)    "caused heart attack"   Patient Measurements: Height: 5\' 9"  (175.3 cm) Weight: 152 lb 12.5 oz (69.3 kg) IBW/kg (Calculated) : 66.2  Heparin dosing weight = 69 kg  Vital Signs: Temp: 98 F (36.7 C) (01/02 1214) Temp Source: Oral (01/02 1214) BP: 107/85 (01/02 1509) Pulse Rate: 76 (01/02 1509)  Labs: Recent Labs    03/16/17 0555 03/17/17 0416 03/17/17 1837 03/18/17 0650  HGB 11.3* 11.2*  --   --   HCT 37.5 37.0  36.8  --   --   PLT 615* 586*  --   --   LABPROT  --   --   --  13.5  INR  --   --   --  1.04  HEPARINUNFRC 0.31 0.75* 0.70 0.90*  CREATININE 0.76 0.68  --   --    Estimated Creatinine Clearance: 52.8 mL/min (by C-G formula based on SCr of 0.68 mg/dL).     Assessment: 85 YOF with Afib to resume IV heparin 6 hours post sheath removal.  Sheath removed at 1435 per procedural log.  RN reported some bruising but no overt bleeding observed.   Goal of Therapy:  Heparin level 0.3-0.7 units/ml Monitor platelets by anticoagulation protocol: Yes    Plan:  At 2235, resume heparin gtt at 1000 units/hr Check 8 hr heparin level Daily heparin level and CBC Monitor for s/sx of bleeding, hematoma   [employer] Teegarden D. Mina Marble, PharmD, BCPS Pager:  [phone] 03/18/2017, 3:43 PM

## 2017-03-18 NOTE — Progress Notes (Signed)
TR Band off at 2058. Site is at a level 0. Pressure dressing in place. Pulse 2+ on right extremity. Will continue to monitor patient.

## 2017-03-18 NOTE — Clinical Social Work Note (Signed)
Clinical Social Work Assessment  Patient Details  Name: Tina West MRN: [id number] Date of Birth: 1928-06-15  Date of referral:  03/18/17               Reason for consult:  Facility Placement, Discharge Planning                Permission sought to share information with:  Facility Sport and exercise psychologist, Family Supports Permission granted to share information::  Yes, Verbal Permission Granted  Name::     [name]  Agency::  SNF's  Relationship::  Granddaughter-in-law  Contact Information:  [phone]   Housing/Transportation Living arrangements for the past 2 months:  [place] of Information:  Patient, Medical Team, Adult Children, Other (Comment Required)(Granddaughter-in-law) Patient Interpreter Needed:  None Criminal Activity/Legal Involvement Pertinent to Current Situation/Hospitalization:  No - Comment as needed Significant Relationships:  Adult Children, Other Family Members Lives with:  Adult Children Do you feel safe going back to the place where you live?  Yes Need for family participation in patient care:  Yes (Comment)  Care giving concerns:  PT recommending SNF once medically stable for discharge.   Social Worker assessment / plan:  CSW met with patient. Son and granddaughter-in-law at bedside. CSW introduced role and explained that PT recommendations would be discussed. Patient's granddaughter-in-law wants patient re-evaluated by PT after her cath to determine if she really needs SNF or not. If they continue to recommend it, [facility] is first preference. CSW paged MD for OT order. PASARR under manual review. Patient cannot go to SNF until PASARR obtained. No further concerns. CSW encouraged patient and her family to contact CSW as needed. CSW will continue to follow patient and her family for support and facilitate discharge to SNF, if needed, once medically stable.  Employment status:  Retired Office manager PT Recommendations:  [place] / Referral to community resources:  [place]  Patient/Family's Response to care:  Patient and her family want her to be re-evaluated by PT again after her cath to confirm she needs SNF. Patient's family supportive and involved in patient's care. Patient and her family appreciated social work intervention.  Patient/Family's Understanding of and Emotional Response to Diagnosis, Current Treatment, and Prognosis:  Patient and her family have a good understanding of the reason for admission and PT recommendations but want her re-evaluated. Patient and her family appear happy with hospital care.  Emotional Assessment Appearance:  Appears stated age Attitude/Demeanor/Rapport:  Engaged, Gracious Affect (typically observed):  Accepting, Appropriate, Calm, Pleasant Orientation:  Oriented to Self, Oriented to Place, Oriented to Situation Alcohol / Substance use:  Never Used Psych involvement (Current and /or in the community):  No (Comment)  Discharge Needs  Concerns to be addressed:  Care Coordination Readmission within the last 30 days:  No Current discharge risk:  Dependent with Mobility Barriers to Discharge:  Continued Medical Work up, [place], LCSW 03/18/2017, 11:57 AM

## 2017-03-18 NOTE — Telephone Encounter (Signed)
Dr. Bridgett Larsson saw this patient for bilateral moderate peripheral vascular disease with non-compressible tibilal arteries and foot wounds. His recs are below, I'm not sure if he spoke with you, but he was curious about your opinion vis-a-vie silver compression stockings.   I don't think you need to see pt as inpatient, but perhaps you could advise in regard to Dr. Lianne Moris recs? If you would like to see pt that is fine with me.   Carlean Jews  [phone]   - VVS recs:  Would get Dr. Jess Barters opinion once he is back in town given his silver impregnated compression stockings   Pt is NOT a surgical candidate at this point with acute changes in cardiac function  - At this point, manage the feet with wound care:  Santyl as currently ordered  Compression once necrotic skin debrided   Patient status post cardiac procedure today, still hospitalized at this time. Will hold message. Will check again tomorrow to see if she is discharged and will schedule appointment.

## 2017-03-18 NOTE — Progress Notes (Signed)
Patient ID: Tina West, female   DOB: March 03, 1929, 84 y.o.   MRN: [id number]  PROGRESS NOTE    Tina West  ESP:[id number] DOB: [birth year] DOA: 03/12/2017 PCP: Claretta Fraise, MD   Brief Narrative:  84 year old female with history of atrial fibrillation, diastolic CHF, hypothyroidism and anxiety presented with worsening shortness of breath for 2 weeks.  Patient was admitted for atrial fibrillation with rapid ventricular rate and elevated troponin.  Cardiology was consulted.  Echo showed ejection fraction of 15-20%.  Plan for catheterization today.  Heart rate control has been difficult; responded well to diuresis.   Assessment & Plan:   Principal Problem:   A-fib ([place]) Active Problems:   Essential hypertension   Left leg cellulitis   Hypothyroidism   Demand ischemia (HCC)   Acute on chronic diastolic (congestive) heart failure (HCC)   Thrombocytosis (HCC)   Protein-calorie malnutrition, severe   Acute systolic CHF (congestive heart failure) (HCC)   PAD (peripheral artery disease) (HCC)   Atrial fibrillation with rapid ventricular rate - CHA2DS2-VASc 5. - rate control difficult.  Currently on metoprolol as per cardiology.  Continue heparin drip. - Needs anticoagulation after cath but financial limitations may limit NOAC use and non-compliance makes warfarin suboptimal. CM consulted.  New cardiomyopathy with LVEF 63-33%, acute systolic CHF (new), acute on chronic diastolic CHF with anasarca and associated demand ischemia  - Improving.  Strict input and output.  Daily weights.  Continue metoprolol.  Cardiology following and plan for cardiac catheterization today.   Possible cellulitis left foot with ulcerations noted present on admission (probably secondary to ruptured bulla but do suspect PAD). No evidence of acute limb ischemia. - appreciate vascular surgery eval  - Keflex completed. Cellulitis resolved.  Bilateral moderate peripheral vascular disease with  non-compressible tibilal arteries -Vascular surgery medications: Pt is NOT a surgical candidate at this point with acute changes in cardiac function - At this point, manage the feet with wound care:  Santyl as currently ordered  Compression once necrotic skin debrided  Dr. Jess Barters office will contact patient to arrange for outpatient follow-up for silver impregnated compression stockings (have been in contact with scheduler Rozell Searing) - Reconsult VVS once cardiac status stable  Hypothyroidism - continue levothyroxine .  Thrombocytosis, chronic - trending down; f/u as an outpatient -Repeat a.m. labs  Aortic atherosclerosis  - continue statin  Severe malnutrition -Follow nutrition recommendations   DVT prophylaxis: Heparin drip Code Status: Full Family Communication: Spoke to son and granddaughter at bedside Disposition Plan: Depends on clinical outcome  Consultants: Cardiology and vascular surgery  Procedures:   Echo Study Conclusions  - Left ventricle: The cavity size was mildly dilated. There was moderate concentric hypertrophy. Systolic function was severely reduced. The estimated ejection fraction was in the range of 15% to 20%. Diffuse hypokinesis. The study was not technically sufficient to allow evaluation of LV diastolic dysfunction due to atrial fibrillation. - Ventricular septum: Septal motion showed paradox. - Aortic valve: Valve mobility was restricted. - Aortic root: The aortic root was normal in size. - Mitral valve: Calcified annulus. Mildly thickened leaflets . There was mild regurgitation. - Left atrium: The atrium was moderately dilated. - Right ventricle: The cavity size was severely dilated. Wall thickness was normal. Systolic function was moderately reduced. - Right atrium: The atrium was moderately dilated. - Tricuspid valve: There was moderate regurgitation. - Pulmonary arteries: Systolic pressure was moderately  increased. PA peak pressure: 54 mm Hg (S). - Inferior vena cava: The vessel was dilated. The respirophasic  diameter changes were blunted (<50%), consistent with elevated central venous pressure. - Pericardium, extracardiac: There was no pericardial effusion   Antimicrobials:   Cefazolin 12/27 >> 12/30  Keflex 12/30 >> 12/31  Subjective: Patient seen and examined at bedside.  Feels weak and short of breath intermittently.  No overnight fever, nausea or vomiting.  Objective: Vitals:   03/18/17 0531 03/18/17 0842 03/18/17 1048 03/18/17 1120  BP: 91/71 109/76    Pulse: 97 (!) 111    Resp: 18     Temp: 97.6 F (36.4 C)     TempSrc: Oral     SpO2: 97% 98% 94% 97%  Weight: 69.3 kg (152 lb 12.5 oz)     Height:        Intake/Output Summary (Last 24 hours) at 03/18/2017 1135 Last data filed at 03/18/2017 0845 Gross per 24 hour  Intake 947.8 ml  Output 400 ml  Net 547.8 ml   Filed Weights   03/16/17 0253 03/17/17 0032 03/18/17 0531  Weight: 68.3 kg (150 lb 9.2 oz) 68.5 kg (151 lb 0.2 oz) 69.3 kg (152 lb 12.5 oz)    Examination:  General exam: Elderly female lying in bed.  No acute distress Respiratory system: Bilateral decreased breath sound at bases with scattered crackles Cardiovascular system: S1 & S2 heard, intermittently tachycardic  gastrointestinal system: Abdomen is nondistended, soft and nontender. Normal bowel sounds heard. Extremities: No cyanosis, clubbing; trace edema   Data Reviewed: I have personally reviewed following labs and imaging studies  CBC: Recent Labs  Lab 03/13/17 0341 03/14/17 1108 03/15/17 0439 03/16/17 0555 03/17/17 0416  WBC 11.1* 9.6 12.5* 13.1* 13.1*  HGB 10.7* 11.3* 11.2* 11.3* 11.2*  HCT 33.8* 37.0 36.7 37.5 37.0  MCV 81.6 83.0 83.4 84.1 83.7  PLT 666* 605* 660* 615* 854*   Basic Metabolic Panel: Recent Labs  Lab 03/13/17 1737 03/14/17 1108 03/15/17 0439 03/16/17 0555 03/17/17 0416  NA 133* 134* 131* 134* 132*  K  3.9 3.3* 3.9 3.8 4.1  CL 99* 96* 92* 89* 87*  CO2 22 25 26  33* 33*  GLUCOSE 154* 96 151* 150* 138*  BUN 37* 31* 39* 38* 40*  CREATININE 1.09* 0.99 0.97 0.76 0.68  CALCIUM 8.9 8.3* 8.9 9.0 9.0  MG 1.8  --   --   --  2.1   GFR: Estimated Creatinine Clearance: 52.8 mL/min (by C-G formula based on SCr of 0.68 mg/dL). Liver Function Tests: Recent Labs  Lab 03/14/17 1108 03/16/17 0555  AST 68* 52*  ALT 85* 75*  ALKPHOS 62 78  BILITOT 1.1 1.2  PROT 5.7* 5.7*  ALBUMIN 3.2* 3.3*   Recent Labs  Lab 03/14/17 1108  LIPASE 33   No results for input(s): AMMONIA in the last 168 hours. Coagulation Profile: Recent Labs  Lab 03/18/17 0650  INR 1.04   Cardiac Enzymes: Recent Labs  Lab 03/12/17 1524 03/12/17 1936 03/12/17 2335 03/13/17 0731  TROPONINI 0.29* 0.29* 0.25* 0.27*   BNP (last 3 results) No results for input(s): PROBNP in the last 8760 hours. HbA1C: No results for input(s): HGBA1C in the last 72 hours. CBG: Recent Labs  Lab 03/17/17 1828  GLUCAP 130*   Lipid Profile: No results for input(s): CHOL, HDL, LDLCALC, TRIG, CHOLHDL, LDLDIRECT in the last 72 hours. Thyroid Function Tests: No results for input(s): TSH, T4TOTAL, FREET4, T3FREE, THYROIDAB in the last 72 hours. Anemia Panel: Recent Labs    03/17/17 0416  VITAMINB12 331   Sepsis Labs: Recent Labs  Lab 03/12/17  1304 03/12/17 1517 03/12/17 2335 03/13/17 0341  LATICACIDVEN 3.19* 2.38* 2.2* 1.7    Recent Results (from the past 240 hour(s))  Blood culture (routine x 2)     Status: None   Collection Time: 03/12/17  1:00 PM  Result Value Ref Range Status   Specimen Description BLOOD LEFT ANTECUBITAL  Final   Special Requests   Final    IN BOTH AEROBIC AND ANAEROBIC BOTTLES Blood Culture adequate volume   Culture NO GROWTH 5 DAYS  Final   Report Status 03/17/2017 FINAL  Final  Blood culture (routine x 2)     Status: None   Collection Time: 03/12/17  1:00 PM  Result Value Ref Range Status    Specimen Description BLOOD RIGHT ANTECUBITAL  Final   Special Requests   Final    IN BOTH AEROBIC AND ANAEROBIC BOTTLES Blood Culture results may not be optimal due to an inadequate volume of blood received in culture bottles   Culture NO GROWTH 5 DAYS  Final   Report Status 03/17/2017 FINAL  Final  Urine culture     Status: None   Collection Time: 03/12/17  1:38 PM  Result Value Ref Range Status   Specimen Description URINE, CLEAN CATCH  Final   Special Requests NONE  Final   Culture NO GROWTH  Final   Report Status 03/13/2017 FINAL  Final  MRSA PCR Screening     Status: None   Collection Time: 03/13/17  1:59 AM  Result Value Ref Range Status   MRSA by PCR NEGATIVE NEGATIVE Final    Comment:        The GeneXpert MRSA Assay (FDA approved for NASAL specimens only), is one component of a comprehensive MRSA colonization surveillance program. It is not intended to diagnose MRSA infection nor to guide or monitor treatment for MRSA infections.          Radiology Studies: No results found.      Scheduled Meds: . atorvastatin  20 mg Oral q1800  . calcium carbonate  1 tablet Oral TID WC  . collagenase   Topical Daily  . diphenhydrAMINE  25 mg Oral Pre-Cath   Or  . diphenhydrAMINE  25 mg Intravenous Pre-Cath  . feeding supplement (ENSURE ENLIVE)  237 mL Oral TID BM  . [START ON 03/19/2017] furosemide  40 mg Oral Daily  . levothyroxine  150 mcg Oral QAC breakfast  . metoprolol tartrate  50 mg Oral BID  . multivitamin with minerals  1 tablet Oral Daily  . sodium chloride flush  3 mL Intravenous Q12H   Continuous Infusions: . sodium chloride    . sodium chloride 10 mL/hr at 03/18/17 1856  . heparin 1,000 Units/hr (03/18/17 0851)     LOS: 6 days        Aline August, MD [facility] Pager [phone]  If 7PM-7AM, please contact night-coverage [email or web address] Password [email or web address] 03/18/2017, 11:35 AM

## 2017-03-19 ENCOUNTER — Inpatient Hospital Stay (HOSPITAL_COMMUNITY): Payer: Medicare PPO

## 2017-03-19 LAB — BASIC METABOLIC PANEL
Anion gap: 11 (ref 5–15)
BUN: 54 mg/dL — AB (ref 6–20)
CALCIUM: 8.6 mg/dL — AB (ref 8.9–10.3)
CHLORIDE: 87 mmol/L — AB (ref 101–111)
CO2: 31 mmol/L (ref 22–32)
CREATININE: 0.87 mg/dL (ref 0.44–1.00)
GFR calc non Af Amer: 59 mL/min — ABNORMAL LOW (ref >60)
Glucose, Bld: 146 mg/dL — ABNORMAL HIGH (ref 65–99)
Potassium: 4.4 mmol/L (ref 3.5–5.1)
Sodium: 129 mmol/L — ABNORMAL LOW (ref 135–145)

## 2017-03-19 LAB — CBC
HEMATOCRIT: 36.1 % (ref 36.0–46.0)
Hemoglobin: 11 g/dL — ABNORMAL LOW (ref 12.0–15.0)
MCH: 25.5 pg — ABNORMAL LOW (ref 26.0–34.0)
MCHC: 30.5 g/dL (ref 30.0–36.0)
MCV: 83.8 fL (ref 78.0–100.0)
PLATELETS: 612 10*3/uL — AB (ref 150–400)
RBC: 4.31 MIL/uL (ref 3.87–5.11)
RDW: 15.6 % — AB (ref 11.5–15.5)
WBC: 12.4 10*3/uL — AB (ref 4.0–10.5)

## 2017-03-19 LAB — MAGNESIUM: Magnesium: 2.2 mg/dL (ref 1.7–2.4)

## 2017-03-19 LAB — HEPARIN LEVEL (UNFRACTIONATED): Heparin Unfractionated: 0.45 IU/mL (ref 0.30–0.70)

## 2017-03-19 MED ORDER — DIPHENHYDRAMINE HCL 50 MG/ML IJ SOLN
12.5000 mg | Freq: Four times a day (QID) | INTRAMUSCULAR | Status: DC | PRN
  Administered 2017-03-19: 12.5 mg via INTRAVENOUS
  Filled 2017-03-19: qty 1

## 2017-03-19 NOTE — Telephone Encounter (Signed)
Still currently in hospital, we will keep an eye on this for when patient discharged we can get her in with Dr. Sharol Given to be seen.

## 2017-03-19 NOTE — Telephone Encounter (Signed)
Pt is still currently in the hospital. Will hold message for now pending discharge and make appt for eval in office.

## 2017-03-19 NOTE — Progress Notes (Signed)
Nutrition Follow-up  DOCUMENTATION CODES:   Severe malnutrition in context of social or environmental circumstances  INTERVENTION:   -Continue Ensure Enlive po TID, each supplement provides 350 kcal and 20 grams of protein -Continue MVI daily  NUTRITION DIAGNOSIS:   Severe Malnutrition related to social / environmental circumstances as evidenced by moderate fat depletion, severe fat depletion, moderate muscle depletion, severe muscle depletion, energy intake < 75% for > or equal to 1 month.  Ongoing  GOAL:   Patient will meet greater than or equal to 90% of their needs  Progressing  MONITOR:   PO intake, Supplement acceptance, Labs, Weight trends, Skin, I & O's  REASON FOR ASSESSMENT:   Consult Wound healing  ASSESSMENT:   Oliva Montecalvo is a 84 y.o. female with medical history of A-fib, dCHF, hypothyroidism and anxiety who comes in for symptoms that have been going on for about 2 wks (started Dec 14th).   12/28- CWOCN c/s revealed 3 areas of full thickness wounds on bilateral feet 12/29- transferred from SDU to floor  Spoke with pt and son at bedside. Pt complains of "being sore on my stomach". Both pt and son report that appetite has improved since last week, however, pt continues to complain of early satiety ("I feel like they bring me more food just as I finish my meal"). Pt reports most of her meals consist of chopped chicken on bread due to chewing difficulty. Offered to downgrade diet, however, pt prefers to choose items on the menu that she feels that she can tolerate. Noted meal completion 20-100%.   Pt enjoys strawberry Ensure supplements and is consuming them. Pt is also receiving ice cream and pudding as snacks from floorstock. Reinforced importance of good meal and supplement intake to promote healing.   Labs reviewed: Na: 129.   Diet Order:  Diet 2 gram sodium Room service appropriate? Yes; Fluid consistency: Thin  EDUCATION NEEDS:   Education needs  have been addressed  Skin:  Skin Assessment: Skin Integrity Issues: Skin Integrity Issues:: Other (Comment) Other: bilateral leg cellulitis, full thickness wounds to bilateral feet  Last BM:  03/18/17  Height:   Ht Readings from Last 1 Encounters:  03/12/17 5\' 9"  (1.753 m)    Weight:   Wt Readings from Last 1 Encounters:  03/19/17 166 lb 3.6 oz (75.4 kg)    Ideal Body Weight:  65.9 kg  BMI:  Body mass index is 24.55 kg/m.  Estimated Nutritional Needs:   Kcal:  1650-1850  Protein:  80-95 grams  Fluid:  > 1.6 L    Carley Strickling A. Jimmye Norman, RD, LDN, CDE Pager: [phone] After hours Pager: [phone]

## 2017-03-19 NOTE — Progress Notes (Signed)
Progress Note  Patient Name: Tina West Date of Encounter: 03/19/2017  Primary Cardiologist: Dr. Debara Pickett  Subjective   Pt fatigued today. Reports breathing is much better. No chest pain. Right radial cath site without swelling or hematoma. She is looking forward to getting up and out of bed.   Inpatient Medications    Scheduled Meds: . atorvastatin  20 mg Oral q1800  . calcium carbonate  1 tablet Oral TID WC  . collagenase   Topical Daily  . feeding supplement (ENSURE ENLIVE)  237 mL Oral TID BM  . furosemide  40 mg Oral Daily  . levothyroxine  150 mcg Oral QAC breakfast  . metoprolol tartrate  50 mg Oral BID  . multivitamin with minerals  1 tablet Oral Daily  . sodium chloride flush  3 mL Intravenous Q12H  . sodium chloride flush  3 mL Intravenous Q12H   Continuous Infusions: . sodium chloride    . sodium chloride    . heparin 1,000 Units/hr (03/18/17 2223)   PRN Meds: sodium chloride, sodium chloride, acetaminophen **OR** acetaminophen, ALPRAZolam, alum & mag hydroxide-simeth, ondansetron **OR** ondansetron (ZOFRAN) IV, sodium chloride flush, sodium chloride flush   Vital Signs    Vitals:   03/18/17 1630 03/18/17 2220 03/19/17 0626 03/19/17 0824  BP: 116/82 113/86 102/64 111/76  Pulse: (!) 101 (!) 118 (!) 115 98  Resp:  20    Temp:  98 F (36.7 C) (!) 97.5 F (36.4 C)   TempSrc:  Oral Oral   SpO2:  95% 94%   Weight:   166 lb 3.6 oz (75.4 kg)   Height:        Intake/Output Summary (Last 24 hours) at 03/19/2017 1052 Last data filed at 03/19/2017 1014 Gross per 24 hour  Intake 387.37 ml  Output 400 ml  Net -12.63 ml   Filed Weights   03/17/17 0032 03/18/17 0531 03/19/17 0626  Weight: 151 lb 0.2 oz (68.5 kg) 152 lb 12.5 oz (69.3 kg) 166 lb 3.6 oz (75.4 kg)    Physical Exam   General: Well developed, well nourished, NAD Skin: Warm, dry, intact  Head: Normocephalic, atraumatic, clear, moist mucus membranes Neck: Negative for carotid bruits. No  JVD Lungs: Diminished bilaterally Clear to ausculation bilaterally. No wheezes, rales, or rhonchi. Breathing is unlabored. Cardiovascular: RRR with S1 S2. No murmurs, rubs, or gallops Abdomen: Soft, non-tender, non-distended with normoactive bowel sounds. No obvious abdominal masses. MSK: Strength and tone are decreased for age. 4/4 in all extremities Extremities: No edema. No clubbing or cyanosis. DP/PT pulses 1+ bilaterally Neuro: Alert and oriented. No focal deficits. No facial asymmetry. MAE spontaneously. Psych: Responds to questions appropriately with normal affect.    Labs    Chemistry Recent Labs  Lab 03/14/17 1108  03/16/17 0555 03/17/17 0416 03/19/17 0745  NA 134*   < > 134* 132* 129*  K 3.3*   < > 3.8 4.1 4.4  CL 96*   < > 89* 87* 87*  CO2 25   < > 33* 33* 31  GLUCOSE 96   < > 150* 138* 146*  BUN 31*   < > 38* 40* 54*  CREATININE 0.99   < > 0.76 0.68 0.87  CALCIUM 8.3*   < > 9.0 9.0 8.6*  PROT 5.7*  --  5.7*  --   --   ALBUMIN 3.2*  --  3.3*  --   --   AST 68*  --  52*  --   --  ALT 85*  --  75*  --   --   ALKPHOS 62  --  78  --   --   BILITOT 1.1  --  1.2  --   --   GFRNONAA 50*   < > >60 >60 59*  GFRAA 58*   < > >60 >60 >60  ANIONGAP 13   < > 12 12 11    < > = values in this interval not displayed.     Hematology Recent Labs  Lab 03/16/17 0555 03/17/17 0416 03/19/17 0745  WBC 13.1* 13.1* 12.4*  RBC 4.46 4.42 4.31  HGB 11.3* 11.2* 11.0*  HCT 37.5 37.0  36.8 36.1  MCV 84.1 83.7 83.8  MCH 25.3* 25.3* 25.5*  MCHC 30.1 30.3 30.5  RDW 15.7* 15.6* 15.6*  PLT 615* 586* 612*    Cardiac Enzymes Recent Labs  Lab 03/12/17 1524 03/12/17 1936 03/12/17 2335 03/13/17 0731  TROPONINI 0.29* 0.29* 0.25* 0.27*    Recent Labs  Lab 03/12/17 1148  TROPIPOC 0.31*     BNP Recent Labs  Lab 03/12/17 2335  BNP 1,846.7*     DDimer No results for input(s): DDIMER in the last 168 hours.   Radiology    Dg Chest Port 1 View  Result Date:  03/19/2017 CLINICAL DATA:  Shortness of Breath EXAM: PORTABLE CHEST 1 VIEW COMPARISON:  03/12/2017 FINDINGS: Cardiomegaly with vascular congestion. Small bilateral effusions with bibasilar and perihilar opacities. This could reflect edema or infection. No acute bony abnormality. IMPRESSION: Cardiomegaly. Bilateral perihilar and lower lobe edema versus infection. Small bilateral effusions. Electronically Signed   By: Rolm Baptise M.D.   On: 03/19/2017 10:45    Telemetry    03/19/17 Afib 110's variability in rate when reviewing telemetry- Personally Reviewed  ECG    03/14/2017: Atrial Fibrillation with LBBB- Personally Reviewed  Cardiac Studies   Cardiac Cath 03/19/17:   The left ventricular ejection fraction is less than 25% by visual estimate.  There is severe left ventricular systolic dysfunction.  LV end diastolic pressure is moderately elevated, 22 mm Hg.  Ost LM to Mid LM lesion is 20% stenosed.  Mid LAD lesion is 50% stenosed.  Ost 2nd Diag lesion is 50% stenosed.  1st Mrg lesion is 80% stenosed. Heavily calcified. This does not explain her low EF.  Ost LAD to Prox LAD lesion is 25% stenosed.  Hemodynamic findings consistent with mild pulmonary hypertension.  CO 2.97 L/min; CI 1.61, Ao sat 99%, PA sat 45%. Elevated PCWP 27 mm Hg.  Right radial loop had to be navigated with a prowater wire to allow catheter to advance.   Mild to moderate CAD.  LV dysfunction is well out of proportion to the degree of CAD.    She needs aggressive management for her severe systolic heart failure.      Echo 03/13/17- Study Conclusions  - Left ventricle: The cavity size was mildly dilated. There was moderate concentric hypertrophy. Systolic function was severely reduced. The estimated ejection fraction was in the range of 15% to 20%. Diffuse hypokinesis. The study was not technically sufficient to allow evaluation of LV diastolic dysfunction due to atrial  fibrillation. - Ventricular septum: Septal motion showed paradox. - Aortic valve: Valve mobility was restricted. - Aortic root: The aortic root was normal in size. - Mitral valve: Calcified annulus. Mildly thickened leaflets . There was mild regurgitation. - Left atrium: The atrium was moderately dilated. - Right ventricle: The cavity size was severely dilated. Wall thickness was normal.  Systolic function was moderately reduced. - Right atrium: The atrium was moderately dilated. - Tricuspid valve: There was moderate regurgitation. - Pulmonary arteries: Systolic pressure was moderately increased. PA peak pressure: 54 mm Hg (S). - Inferior vena cava: The vessel was dilated. The respirophasic diameter changes were blunted (<50%), consistent with elevated central venous pressure. - Pericardium, extracardiac: There was no pericardial effusion.  LEA doppler 03/14/17 Final Interpretation: Right: Resting right ankle-brachial index indicates noncompressible right lower extremity arteries.The right toe-brachial index is normal. ABIs are unreliable. Waveforms are consistent with moderate disease. Left: Resting left ankle-brachial index indicates noncompressible right lower extremity arteries.The left toe-brachial index is abnormal. ABIs are unreliable. Waveforms are consistent with moderate disease.   Patient Profile     84 y.o. female with hypertension, hypothyroidism, anxiety, LLE cellulitis and medical non-compliance (financial) admitted with acute systolic and diastolic heart failure (EF per Echo 15-20% 03/13/17) and new onset, difficult to control atrial fibrillation on 03/12/17.   Assessment & Plan    1. New onset atrial fibrillation with RVR: -Remains in Afib on Lopressor tartrate 50 mg BID. HR's elevated and variable -Rate has been difficult to control this admission due to borderline BP -Heparin gtt given cath 03/18/17, will need to decide on anticoagulation therapy,  however given financial limitations and concerns, this may limit NOAC use and non-compliance makes warfarin sub-optimal -Need to clarify plan on whether DCCV is an option  -Renal function WNL 0.76>0.68>0.87 -CHA2DS2-VASc5  2. Acute systolic and diastolic heart failure:  -EF per Echocardiogram 15-50% 03/13/17 -LHC without complication, 55/73/22. Revealed mild to moderate CAD with recommendations to aggressively manage her severe systolic dysfunction  -Per cath report LV dysfunction is well out of proportion to the degree of CAD  -Heart failure team input could be beneficial for long term management -I am concerned about overall prognosis given age, low EF, comorbities, non-compliance/barriers to care, and low BP prohibiting medical care -Weight 166lb today, up form 164lb at admission 03/12/17 -Lasix was transitioned to PO today, 03/19/17  -Will discuss Lasix given rising BUN and decreasing Na+ -Strict I&O, daily weights    3. PVD- Moderate by ABI's: -Per IM -Not a surgical candidate at this point until more stable from a cardiac standpoint  -Pt started with wound care management regimen and to be followed by Dr. Sharol Given as an outpatient  -VVS recommends to follow once more stable  4. Hyponatremia: -Na+, 131>134>132>129 -Will review with MD    Signed, Kathyrn Drown, NP  03/19/2017, 10:52 AM    For questions or updates, please contact   Please consult [email or web address] for contact info under Cardiology/STEMI.  Attending Note:   The patient was seen and examined.  Agree with assessment and plan as noted above.  Changes made to the above note as needed.  Patient seen and independently examined with Kathyrn Drown, NP .   We discussed all aspects of the encounter. I agree with the assessment and plan as stated above.  1.   Acute on chronic combined CHF:   LV EF is 15-20%. Has moderate CAD but the amount of CAD does not seem to be significant enough to be the etiology of her CHF. She has  had AF .  Its not clear if she had a well controlled HR or not She admits to not taking any meds for quite a while .  Continue metoprolol BP is too low to start ARB at this point   2.  Hyponatremia:   Her son has been giving  her water from the faucet in the room.  I suspect that we do not have accurate I/Os . Ive stressed the impotance of knowing exactly how much she is drinking and I've encuoraged her to drink only what comes on her try or what the nurse gives her.      I have spent a total of 40 minutes with patient reviewing hospital  notes , telemetry, EKGs, labs and examining patient as well as establishing an assessment and plan that was discussed with the patient. > 50% of time was spent in direct patient care.    Thayer Headings, Brooke Bonito., MD, [facility] 03/19/2017, 2:30 PM [address]  [place] Pager [phone]

## 2017-03-19 NOTE — Progress Notes (Signed)
ANTICOAGULATION CONSULT NOTE - Follow Up Consult  Pharmacy Consult:  Heparin Indication: atrial fibrillation  Allergies  Allergen Reactions  . Codeine Hives  . Contrast Media [Iodinated Diagnostic Agents] Other (See Comments)    unspecified  . Diazepam Hives  . Meperidine Hcl Other (See Comments)    unspecified  . Morphine Other (See Comments)    "caused heart attack"   Patient Measurements: Height: 5\' 9"  (175.3 cm) Weight: 166 lb 3.6 oz (75.4 kg) IBW/kg (Calculated) : 66.2  Heparin dosing weight = 69 kg  Vital Signs: Temp: 97.5 F (36.4 C) (01/03 0626) Temp Source: Oral (01/03 0626) BP: 102/64 (01/03 0626) Pulse Rate: 115 (01/03 0626)  Labs: Recent Labs    03/17/17 0416 03/17/17 1837 03/18/17 0650  HGB 11.2*  --   --   HCT 37.0  36.8  --   --   PLT 586*  --   --   LABPROT  --   --  13.5  INR  --   --  1.04  HEPARINUNFRC 0.75* 0.70 0.90*  CREATININE 0.68  --   --    Estimated Creatinine Clearance: 52.8 mL/min (by C-G formula based on SCr of 0.68 mg/dL).     Assessment: 49 YOF with Afib on heparin, now resumed s/p cath on 1/2. Not on anticoagulation PTA. Heparin level therapeutic. CBC stable. No bleed documented.   Goal of Therapy:  Heparin level 0.3-0.7 units/ml Monitor platelets by anticoagulation protocol: Yes    Plan:  Continue heparin gtt at 1000 units/hr Daily heparin level and CBC Monitor for s/sx of bleeding   Elicia Lamp, PharmD, BCPS Clinical Pharmacist Clinical phone for 03/19/2017 until 3:30pm: [phone] If after 3:30pm, please call main pharmacy at: [phone] 03/19/2017 8:23 AM

## 2017-03-19 NOTE — Progress Notes (Signed)
OT Cancellation Note  Patient Details Name: Tina West MRN: [id number] DOB: 1928-11-18   Cancelled Treatment:    Reason Eval/Treat Not Completed: Patient declined, no reason specified. Educated pt and son in benefits of OOB activity. Pt reports she needs to have a BM, but declined bed pan or transfer to 3 in 1. Pt not oriented to time or situation. Son reports pt is typically very sharp.  Malka So 03/19/2017, 9:35 AM  03/19/2017 Nestor Lewandowsky, OTR/L Pager: [phone]

## 2017-03-19 NOTE — Progress Notes (Signed)
Notified Dr Wyline Copas of patient's face looking flush.

## 2017-03-19 NOTE — Progress Notes (Signed)
PROGRESS NOTE    Tina West  YQM:[id number] DOB: 19-Feb-1929 DOA: 03/12/2017 PCP: Claretta Fraise, MD    Brief Narrative:  84 year old female with history of atrial fibrillation, diastolic CHF, hypothyroidism and anxiety presented with worsening shortness of breath for 2 weeks.  Patient was admitted for atrial fibrillation with rapid ventricular rate and elevated troponin.  Cardiology was consulted.  Echo showed ejection fraction of 15-20%.  Plan for catheterization today.  Heart rate control has been difficult; responded well to diuresis.  Assessment & Plan:   Principal Problem:   A-fib ([place]) Active Problems:   Essential hypertension   Left leg cellulitis   Hypothyroidism   Demand ischemia (HCC)   Acute on chronic diastolic (congestive) heart failure (HCC)   Thrombocytosis (HCC)   Protein-calorie malnutrition, severe   Acute systolic CHF (congestive heart failure) (HCC)   PAD (peripheral artery disease) (HCC)  Atrial fibrillation with rapid ventricular rate - CHA2DS2-VASc5. -rate control had been noted to be difficult to control.  Patient currently on metoprolol as per cardiology.  Continue heparin drip as tolerated - Needs anticoagulation after cath but financial limitations may limit NOAC use and non-compliance makes warfarin suboptimal. Followed by Cardiology  New cardiomyopathy with LVEF 52-84%, acute systolic CHF (new), acute on chronic diastolic CHF with anasarca and associated demand ischemia  -Improving.  Strict input and output.  Daily weights.  Continue metoprolol.  Cardiology is following and pt is s/p heart cath 03/18/17 with findings of 20% ost LM to mid LM, 50%mid LAD, 50% ost 2nd diagonal, 25% 1st mrg to prox LAD, 80% 1st marg -Recommendation for aggressive management for heart failure  Possible cellulitis left foot with ulcerations noted present on admission(probably secondary to ruptured bulla but do suspect PAD). No evidence of acute limb ischemia. -  appreciate vascular surgery eval - Keflexcompleted. Cellulitis resolved. -Continue to monitor  Bilateral moderate peripheral vascular disease with non-compressible tibilal arteries -Vascular surgery medications: Pt is NOT a surgical candidate at this point with acute changes in cardiac function - At this point, manage the feet with wound care:  Continue Santyl as currently ordered  Compression once necrotic skin debrided  Dr. Jess Barters office will contact patient to arrange for outpatient follow-up forsilver impregnated compression stockings(have been in contact with scheduler Rozell Searing) - Consider reconsultVVSonce cardiac status stable  Hypothyroidism - continue levothyroxine .  Thrombocytosis, chronic -trending down;f/u as an outpatient -Repeat a.m. labs  Aortic atherosclerosis  - continue statin  Severe malnutrition -Follow nutrition recommendations  DVT prophylaxis: Heparin gtt Code Status: Full Family Communication: Pt in room Disposition Plan: Uncertain at this time  Consultants:   Cardiology  Procedures:   Echo Study Conclusions  - Left ventricle: The cavity size was mildly dilated. There was moderate concentric hypertrophy. Systolic function was severely reduced. The estimated ejection fraction was in the range of 15% to 20%. Diffuse hypokinesis. The study was not technically sufficient to allow evaluation of LV diastolic dysfunction due to atrial fibrillation. - Ventricular septum: Septal motion showed paradox. - Aortic valve: Valve mobility was restricted. - Aortic root: The aortic root was normal in size. - Mitral valve: Calcified annulus. Mildly thickened leaflets . There was mild regurgitation. - Left atrium: The atrium was moderately dilated. - Right ventricle: The cavity size was severely dilated. Wall thickness was normal. Systolic function was moderately reduced. - Right atrium: The atrium was moderately  dilated. - Tricuspid valve: There was moderate regurgitation. - Pulmonary arteries: Systolic pressure was moderately increased. PA peak pressure: 54  mm Hg (S). - Inferior vena cava: The vessel was dilated. The respirophasic diameter changes were blunted (<50%), consistent with elevated central venous pressure. - Pericardium, extracardiac: There was no pericardial effusion  -Heart cath 03/18/17  Antimicrobials: Anti-infectives (From admission, onward)   Start     Dose/Rate Route Frequency Ordered Stop   03/15/17 1245  cephALEXin (KEFLEX) capsule 500 mg     500 mg Oral Every 12 hours 03/15/17 1230 03/16/17 2100   03/13/17 1530  ceFAZolin (ANCEF) IVPB 1 g/50 mL premix  Status:  Discontinued     1 g 100 mL/hr over 30 Minutes Intravenous Every 8 hours 03/13/17 1423 03/15/17 1230   03/12/17 1500  ceFAZolin (ANCEF) IVPB 1 g/50 mL premix     1 g 100 mL/hr over 30 Minutes Intravenous  Once 03/12/17 1448 03/12/17 1616       Subjective: Without complaints at this time  Objective: Vitals:   03/18/17 2220 03/19/17 0626 03/19/17 0824 03/19/17 1439  BP: 113/86 102/64 111/76 111/71  Pulse: (!) 118 (!) 115 98 85  Resp: 20   18  Temp: 98 F (36.7 C) (!) 97.5 F (36.4 C)  (!) 97.5 F (36.4 C)  TempSrc: Oral Oral  Oral  SpO2: 95% 94%  100%  Weight:  75.4 kg (166 lb 3.6 oz)    Height:        Intake/Output Summary (Last 24 hours) at 03/19/2017 1544 Last data filed at 03/19/2017 1345 Gross per 24 hour  Intake 293 ml  Output 400 ml  Net -107 ml   Filed Weights   03/17/17 0032 03/18/17 0531 03/19/17 0626  Weight: 68.5 kg (151 lb 0.2 oz) 69.3 kg (152 lb 12.5 oz) 75.4 kg (166 lb 3.6 oz)    Examination:  General exam: Appears calm and comfortable  Respiratory system: Clear to auscultation. Respiratory effort normal. Cardiovascular system: S1 & S2 heard, RRR Gastrointestinal system: Abdomen is nondistended, soft and nontender. No organomegaly or masses felt. Normal bowel sounds  heard. Central nervous system: Alert and oriented. No focal neurological deficits. Extremities: Symmetric 5 x 5 power. Skin: No rashes, lesions  Psychiatry: Judgement and insight appear normal. Mood & affect appropriate.   Data Reviewed: I have personally reviewed following labs and imaging studies  CBC: Recent Labs  Lab 03/14/17 1108 03/15/17 0439 03/16/17 0555 03/17/17 0416 03/19/17 0745  WBC 9.6 12.5* 13.1* 13.1* 12.4*  HGB 11.3* 11.2* 11.3* 11.2* 11.0*  HCT 37.0 36.7 37.5 37.0  36.8 36.1  MCV 83.0 83.4 84.1 83.7 83.8  PLT 605* 660* 615* 586* 381*   Basic Metabolic Panel: Recent Labs  Lab 03/13/17 1737 03/14/17 1108 03/15/17 0439 03/16/17 0555 03/17/17 0416 03/19/17 0745  NA 133* 134* 131* 134* 132* 129*  K 3.9 3.3* 3.9 3.8 4.1 4.4  CL 99* 96* 92* 89* 87* 87*  CO2 22 25 26  33* 33* 31  GLUCOSE 154* 96 151* 150* 138* 146*  BUN 37* 31* 39* 38* 40* 54*  CREATININE 1.09* 0.99 0.97 0.76 0.68 0.87  CALCIUM 8.9 8.3* 8.9 9.0 9.0 8.6*  MG 1.8  --   --   --  2.1 2.2   GFR: Estimated Creatinine Clearance: 48.5 mL/min (by C-G formula based on SCr of 0.87 mg/dL). Liver Function Tests: Recent Labs  Lab 03/14/17 1108 03/16/17 0555  AST 68* 52*  ALT 85* 75*  ALKPHOS 62 78  BILITOT 1.1 1.2  PROT 5.7* 5.7*  ALBUMIN 3.2* 3.3*   Recent Labs  Lab 03/14/17 1108  LIPASE 33   No results for input(s): AMMONIA in the last 168 hours. Coagulation Profile: Recent Labs  Lab 03/18/17 0650  INR 1.04   Cardiac Enzymes: Recent Labs  Lab 03/12/17 1936 03/12/17 2335 03/13/17 0731  TROPONINI 0.29* 0.25* 0.27*   BNP (last 3 results) No results for input(s): PROBNP in the last 8760 hours. HbA1C: No results for input(s): HGBA1C in the last 72 hours. CBG: Recent Labs  Lab 03/17/17 1828  GLUCAP 130*   Lipid Profile: No results for input(s): CHOL, HDL, LDLCALC, TRIG, CHOLHDL, LDLDIRECT in the last 72 hours. Thyroid Function Tests: No results for input(s): TSH, T4TOTAL,  FREET4, T3FREE, THYROIDAB in the last 72 hours. Anemia Panel: Recent Labs    03/17/17 0416  VITAMINB12 331   Sepsis Labs: Recent Labs  Lab 03/12/17 2335 03/13/17 0341  LATICACIDVEN 2.2* 1.7    Recent Results (from the past 240 hour(s))  Blood culture (routine x 2)     Status: None   Collection Time: 03/12/17  1:00 PM  Result Value Ref Range Status   Specimen Description BLOOD LEFT ANTECUBITAL  Final   Special Requests   Final    IN BOTH AEROBIC AND ANAEROBIC BOTTLES Blood Culture adequate volume   Culture NO GROWTH 5 DAYS  Final   Report Status 03/17/2017 FINAL  Final  Blood culture (routine x 2)     Status: None   Collection Time: 03/12/17  1:00 PM  Result Value Ref Range Status   Specimen Description BLOOD RIGHT ANTECUBITAL  Final   Special Requests   Final    IN BOTH AEROBIC AND ANAEROBIC BOTTLES Blood Culture results may not be optimal due to an inadequate volume of blood received in culture bottles   Culture NO GROWTH 5 DAYS  Final   Report Status 03/17/2017 FINAL  Final  Urine culture     Status: None   Collection Time: 03/12/17  1:38 PM  Result Value Ref Range Status   Specimen Description URINE, CLEAN CATCH  Final   Special Requests NONE  Final   Culture NO GROWTH  Final   Report Status 03/13/2017 FINAL  Final  MRSA PCR Screening     Status: None   Collection Time: 03/13/17  1:59 AM  Result Value Ref Range Status   MRSA by PCR NEGATIVE NEGATIVE Final    Comment:        The GeneXpert MRSA Assay (FDA approved for NASAL specimens only), is one component of a comprehensive MRSA colonization surveillance program. It is not intended to diagnose MRSA infection nor to guide or monitor treatment for MRSA infections.      Radiology Studies: Dg Chest Port 1 View  Result Date: 03/19/2017 CLINICAL DATA:  Shortness of Breath EXAM: PORTABLE CHEST 1 VIEW COMPARISON:  03/12/2017 FINDINGS: Cardiomegaly with vascular congestion. Small bilateral effusions with bibasilar  and perihilar opacities. This could reflect edema or infection. No acute bony abnormality. IMPRESSION: Cardiomegaly. Bilateral perihilar and lower lobe edema versus infection. Small bilateral effusions. Electronically Signed   By: Rolm Baptise M.D.   On: 03/19/2017 10:45    Scheduled Meds: . atorvastatin  20 mg Oral q1800  . calcium carbonate  1 tablet Oral TID WC  . collagenase   Topical Daily  . feeding supplement (ENSURE ENLIVE)  237 mL Oral TID BM  . furosemide  40 mg Oral Daily  . levothyroxine  150 mcg Oral QAC breakfast  . metoprolol tartrate  50 mg Oral BID  . multivitamin with  minerals  1 tablet Oral Daily  . sodium chloride flush  3 mL Intravenous Q12H  . sodium chloride flush  3 mL Intravenous Q12H   Continuous Infusions: . sodium chloride    . sodium chloride    . heparin 1,000 Units/hr (03/18/17 2223)     LOS: 7 days   Marylu Lund, MD [facility] Pager [phone]  If 7PM-7AM, please contact night-coverage [email or web address] Password [facility] 03/19/2017, 3:44 PM

## 2017-03-19 NOTE — Progress Notes (Signed)
Physical Therapy Treatment Patient Details Name: Tina West MRN: [id number] DOB: Mar 05, 1929 Today's Date: 03/19/2017    History of Present Illness 84yo presented with LE edema, orthopnea, DOE. Admitted for (new) afib/RVR, volume overload, elevated troponin. HR control has been difficult; responded well to diuresis; echo showed new cardiomyopathy LVEF 15-20%. S/p RIGHT/LEFT HEART CATH AND CORONARY ANGIOGRAPHY 03/18/17.    PT Comments    Patient received in bed, pleasant and willing to participate in PT this afternoon. Measured at 94% O2 on room air, HR 81 at rest in bed; note mild diaphoresis however patient otherwise asymptomatic at rest. Able to perform bed mobility to EOB with Min assist, continues to require Mod assist to stand at EOB; note rapid O2 decline to 80% O2 on room air, unable to bring sats up with pursed lip breathing in sitting so replaced nasal cannula at 2LPM after which O2 rose back to 95-100%. Note that monitor also showed possible bradycardia with activity however signal not clear and unable to confirm this with tele, will continue to closely monitor as she continues to participate with PT. RN and MD aware of patient response to activity. Patient left in bed with family in room and bed alarm activated, CNA present and attending.     Follow Up Recommendations  SNF;Supervision/Assistance - 24 hour     Equipment Recommendations  Rolling walker with 5" wheels    Recommendations for Other Services       Precautions / Restrictions Precautions Precautions: Fall Restrictions Weight Bearing Restrictions: No    Mobility  Bed Mobility Overal bed mobility: Needs Assistance Bed Mobility: Supine to Sit;Sit to Supine     Supine to sit: Min assist;HOB elevated Sit to supine: Min assist   General bed mobility comments: Min assist for safety, completing pivot to EOB and bringing trunk up   Transfers Overall transfer level: Needs assistance Equipment used: Rolling walker  (2 wheeled) Transfers: Sit to/from Stand Sit to Stand: Mod assist         General transfer comment: VC for hand palcement, Mod A to come to full standing   Ambulation/Gait             General Gait Details: DNT due to severe fatigue, O2 drop to 80 with standing on room air, monitor displaying possible bradycardia with activity (unable to confirm via tele)   Stairs            Wheelchair Mobility    Modified Rankin (Stroke Patients Only)       Balance Overall balance assessment: Needs assistance Sitting-balance support: Bilateral upper extremity supported;Feet supported Sitting balance-Leahy Scale: Good     Standing balance support: Bilateral upper extremity supported;During functional activity Standing balance-Leahy Scale: Fair Standing balance comment: reliant on B UE support                             Cognition Arousal/Alertness: Awake/alert Behavior During Therapy: WFL for tasks assessed/performed Overall Cognitive Status: Impaired/Different from baseline Area of Impairment: Memory;Following commands;Safety/judgement;Awareness;Problem solving                     Memory: Decreased short-term memory Following Commands: Follows one step commands consistently;Follows multi-step commands inconsistently Safety/Judgement: Decreased awareness of safety;Decreased awareness of deficits Awareness: Emergent Problem Solving: Slow processing;Decreased initiation General Comments: Pt tangential in her speech and will begin speaking about one topic and then switch to another story.       Exercises  General Comments General comments (skin integrity, edema, etc.): MD aware of O2 drop during session, patient performance during session       Pertinent Vitals/Pain Pain Assessment: Faces Faces Pain Scale: No hurt Pain Intervention(s): Monitored during session    Home Living                      Prior Function            PT  Goals (current goals can now be found in the care plan section) Acute Rehab PT Goals Patient Stated Goal: go home PT Goal Formulation: With patient Time For Goal Achievement: 03/31/17 Potential to Achieve Goals: Fair Progress towards PT goals: Progressing toward goals    Frequency    Min 2X/week      PT Plan Current plan remains appropriate    Co-evaluation              AM-PAC PT "6 Clicks" Daily Activity  Outcome Measure  Difficulty turning over in bed (including adjusting bedclothes, sheets and blankets)?: Unable Difficulty moving from lying on back to sitting on the side of the bed? : Unable Difficulty sitting down on and standing up from a chair with arms (e.g., wheelchair, bedside commode, etc,.)?: Unable Help needed moving to and from a bed to chair (including a wheelchair)?: A Lot Help needed walking in hospital room?: A Lot Help needed climbing 3-5 steps with a railing? : Total 6 Click Score: 8    End of Session Equipment Utilized During Treatment: Gait belt Activity Tolerance: Patient limited by fatigue Patient left: in bed;with bed alarm set;with call bell/phone within reach;with nursing/sitter in room Nurse Communication: Other (comment)(O2 response to activity on room air ) PT Visit Diagnosis: Unsteadiness on feet (R26.81);Muscle weakness (generalized) (M62.81);Difficulty in walking, not elsewhere classified (R26.2);Pain     Time: 0174-9449 PT Time Calculation (min) (ACUTE ONLY): 34 min  Charges:  $Therapeutic Activity: 23-37 mins                    G Codes:  Functional Assessment Tool Used: AM-PAC 6 Clicks Basic Mobility;Clinical judgement    Deniece Ree PT, DPT, CBIS  Supplemental Physical Therapist [place]

## 2017-03-19 NOTE — Clinical Social Work Note (Addendum)
[facility] is not in network with [employer]. CSW called patient's granddaughter-in-law to notify and provided other bed offers. She will research [facility], [facility], and [facility] and then call CSW with decision.  Dayton Scrape, CSW [phone]  4:18 pm Patient's granddaughter-in-law has chosen [facility]. Patient and her son notified at bedside. Per MD, discharge date is unknown but he agreed that insurance authorization should be started in case of weekend discharge. CSW faxed clinicals to [facility] for authorization review.  Dayton Scrape, [place]

## 2017-03-19 NOTE — Progress Notes (Signed)
Wrapped pt's feet used Santyl cream/gauze and kurlex. Pt tolerated well.

## 2017-03-20 DIAGNOSIS — I5043 Acute on chronic combined systolic (congestive) and diastolic (congestive) heart failure: Secondary | ICD-10-CM

## 2017-03-20 LAB — BASIC METABOLIC PANEL
ANION GAP: 7 (ref 5–15)
BUN: 60 mg/dL — ABNORMAL HIGH (ref 6–20)
CALCIUM: 9 mg/dL (ref 8.9–10.3)
CO2: 35 mmol/L — ABNORMAL HIGH (ref 22–32)
Chloride: 89 mmol/L — ABNORMAL LOW (ref 101–111)
Creatinine, Ser: 0.92 mg/dL (ref 0.44–1.00)
GFR, EST NON AFRICAN AMERICAN: 55 mL/min — AB (ref >60)
GLUCOSE: 133 mg/dL — AB (ref 65–99)
Potassium: 5.1 mmol/L (ref 3.5–5.1)
SODIUM: 131 mmol/L — AB (ref 135–145)

## 2017-03-20 LAB — CBC
HCT: 37.2 % (ref 36.0–46.0)
Hemoglobin: 11.4 g/dL — ABNORMAL LOW (ref 12.0–15.0)
MCH: 25.3 pg — ABNORMAL LOW (ref 26.0–34.0)
MCHC: 30.6 g/dL (ref 30.0–36.0)
MCV: 82.7 fL (ref 78.0–100.0)
PLATELETS: 592 10*3/uL — AB (ref 150–400)
RBC: 4.5 MIL/uL (ref 3.87–5.11)
RDW: 15.5 % (ref 11.5–15.5)
WBC: 11.1 10*3/uL — AB (ref 4.0–10.5)

## 2017-03-20 LAB — HEPARIN LEVEL (UNFRACTIONATED): HEPARIN UNFRACTIONATED: 0.54 [IU]/mL (ref 0.30–0.70)

## 2017-03-20 NOTE — Progress Notes (Signed)
PT Cancellation Note  Patient Details Name: Tina West MRN: [id number] DOB: 03-17-1929   Cancelled Treatment:    Reason Eval/Treat Not Completed: Patient declined, no reason specified Attempted to provide skilled PT treatment this afternoon, patient and family report they are waiting on CNA to clean her up following BM, they would prefer PT to return following this. Will attempt to return if time allows.     Deniece Ree PT, DPT, CBIS  Supplemental Physical Therapist [facility]   Pager [phone]

## 2017-03-20 NOTE — Progress Notes (Signed)
PROGRESS NOTE    Tina West  ZOX:[id number] DOB: 11-26-1928 DOA: 03/12/2017 PCP: Claretta Fraise, MD    Brief Narrative:  84 year old female with history of atrial fibrillation, diastolic CHF, hypothyroidism and anxiety presented with worsening shortness of breath for 2 weeks.  Patient was admitted for atrial fibrillation with rapid ventricular rate and elevated troponin.  Cardiology was consulted.  Echo showed ejection fraction of 15-20%.  Plan for catheterization today.  Heart rate control has been difficult; responded well to diuresis.  Assessment & Plan:   Principal Problem:   A-fib ([place]) Active Problems:   Essential hypertension   Left leg cellulitis   Hypothyroidism   Demand ischemia (HCC)   Acute on chronic diastolic (congestive) heart failure (HCC)   Thrombocytosis (HCC)   Protein-calorie malnutrition, severe   Acute systolic CHF (congestive heart failure) (HCC)   PAD (peripheral artery disease) (HCC)  Atrial fibrillation with rapid ventricular rate - CHA2DS2-VASc5. -rate control had been noted to be difficult to control.  Patient currently on metoprolol as per cardiology. Patient is continued on heparin drip  - Needs anticoagulation after cath but financial limitations may limit NOAC use and non-compliance makes warfarin suboptimal. Cardiology continues to follow  New cardiomyopathy with LVEF 81-19%, acute systolic CHF (new), acute on chronic diastolic CHF with anasarca and associated demand ischemia  -Improving.  Strict input and output.  Daily weights.  Continue metoprolol.  Cardiology is following and pt is s/p heart cath 03/18/17 with findings of 20% ost LM to mid LM, 50%mid LAD, 50% ost 2nd diagonal, 25% 1st mrg to prox LAD, 80% 1st marg -Recommendation for continued aggressive management for heart failure per Cardiology  Possible cellulitis left foot with ulcerations noted present on admission(probably secondary to ruptured bulla but do suspect PAD). No  evidence of acute limb ischemia. - appreciate vascular surgery eval - Keflexcompleted. Cellulitis resolved. -Continue to monitor  Bilateral moderate peripheral vascular disease with non-compressible tibilal arteries -Vascular surgery medications: Pt is NOT a surgical candidate at this point with acute changes in cardiac function - At this point, manage the feet with wound care:  Continue Santyl as currently ordered  Compression once necrotic skin debrided  Dr. Jess Barters office will contact patient to arrange for outpatient follow-up forsilver impregnated compression stockings(have been in contact with scheduler [place])  Hypothyroidism - Will continue levothyroxine .  Thrombocytosis, chronic -trending down;f/u as an outpatient -Recheck a.m. labs  Aortic atherosclerosis  - continue statin as tolerated  Severe malnutrition -Follow nutrition recommendations  DVT prophylaxis: Heparin gtt Code Status: Full Family Communication: Pt in room, family at bedside Disposition Plan: Uncertain at this time  Consultants:   Cardiology  Procedures:   Echo Study Conclusions  - Left ventricle: The cavity size was mildly dilated. There was moderate concentric hypertrophy. Systolic function was severely reduced. The estimated ejection fraction was in the range of 15% to 20%. Diffuse hypokinesis. The study was not technically sufficient to allow evaluation of LV diastolic dysfunction due to atrial fibrillation. - Ventricular septum: Septal motion showed paradox. - Aortic valve: Valve mobility was restricted. - Aortic root: The aortic root was normal in size. - Mitral valve: Calcified annulus. Mildly thickened leaflets . There was mild regurgitation. - Left atrium: The atrium was moderately dilated. - Right ventricle: The cavity size was severely dilated. Wall thickness was normal. Systolic function was moderately reduced. - Right atrium: The atrium  was moderately dilated. - Tricuspid valve: There was moderate regurgitation. - Pulmonary arteries: Systolic pressure was moderately  increased. PA peak pressure: 54 mm Hg (S). - Inferior vena cava: The vessel was dilated. The respirophasic diameter changes were blunted (<50%), consistent with elevated central venous pressure. - Pericardium, extracardiac: There was no pericardial effusion  -Heart cath 03/18/17  Antimicrobials: Anti-infectives (From admission, onward)   Start     Dose/Rate Route Frequency Ordered Stop   03/15/17 1245  cephALEXin (KEFLEX) capsule 500 mg     500 mg Oral Every 12 hours 03/15/17 1230 03/16/17 2100   03/13/17 1530  ceFAZolin (ANCEF) IVPB 1 g/50 mL premix  Status:  Discontinued     1 g 100 mL/hr over 30 Minutes Intravenous Every 8 hours 03/13/17 1423 03/15/17 1230   03/12/17 1500  ceFAZolin (ANCEF) IVPB 1 g/50 mL premix     1 g 100 mL/hr over 30 Minutes Intravenous  Once 03/12/17 1448 03/12/17 1616      Subjective: No complaints at this time  Objective: Vitals:   03/20/17 0859 03/20/17 1022 03/20/17 1029 03/20/17 1200  BP: 119/74   102/63  Pulse: (!) 104   71  Resp:    18  Temp:    97.6 F (36.4 C)  TempSrc:    Oral  SpO2:  99% 99% 98%  Weight:      Height:        Intake/Output Summary (Last 24 hours) at 03/20/2017 1348 Last data filed at 03/20/2017 0852 Gross per 24 hour  Intake 240 ml  Output 200 ml  Net 40 ml   Filed Weights   03/18/17 0531 03/19/17 0626 03/20/17 0453  Weight: 69.3 kg (152 lb 12.5 oz) 75.4 kg (166 lb 3.6 oz) 74.4 kg (164 lb 0.4 oz)    Examination: General exam: Awake, laying in bed, in nad Respiratory system: Normal respiratory effort, no wheezing Cardiovascular system: regular rate, s1, s2 Gastrointestinal system: Soft, nondistended, positive BS Central nervous system: CN2-12 grossly intact, strength intact Extremities: Perfused, no clubbing Skin: Normal skin turgor, no notable skin lesions seen Psychiatry:  Mood normal // no visual hallucinations    Data Reviewed: I have personally reviewed following labs and imaging studies  CBC: Recent Labs  Lab 03/15/17 0439 03/16/17 0555 03/17/17 0416 03/19/17 0745 03/20/17 0709  WBC 12.5* 13.1* 13.1* 12.4* 11.1*  HGB 11.2* 11.3* 11.2* 11.0* 11.4*  HCT 36.7 37.5 37.0  36.8 36.1 37.2  MCV 83.4 84.1 83.7 83.8 82.7  PLT 660* 615* 586* 612* 081*   Basic Metabolic Panel: Recent Labs  Lab 03/13/17 1737  03/15/17 0439 03/16/17 0555 03/17/17 0416 03/19/17 0745 03/20/17 0709  NA 133*   < > 131* 134* 132* 129* 131*  K 3.9   < > 3.9 3.8 4.1 4.4 5.1  CL 99*   < > 92* 89* 87* 87* 89*  CO2 22   < > 26 33* 33* 31 35*  GLUCOSE 154*   < > 151* 150* 138* 146* 133*  BUN 37*   < > 39* 38* 40* 54* 60*  CREATININE 1.09*   < > 0.97 0.76 0.68 0.87 0.92  CALCIUM 8.9   < > 8.9 9.0 9.0 8.6* 9.0  MG 1.8  --   --   --  2.1 2.2  --    < > = values in this interval not displayed.   GFR: Estimated Creatinine Clearance: 45.9 mL/min (by C-G formula based on SCr of 0.92 mg/dL). Liver Function Tests: Recent Labs  Lab 03/14/17 1108 03/16/17 0555  AST 68* 52*  ALT 85* 75*  ALKPHOS  62 78  BILITOT 1.1 1.2  PROT 5.7* 5.7*  ALBUMIN 3.2* 3.3*   Recent Labs  Lab 03/14/17 1108  LIPASE 33   No results for input(s): AMMONIA in the last 168 hours. Coagulation Profile: Recent Labs  Lab 03/18/17 0650  INR 1.04   Cardiac Enzymes: No results for input(s): CKTOTAL, CKMB, CKMBINDEX, TROPONINI in the last 168 hours. BNP (last 3 results) No results for input(s): PROBNP in the last 8760 hours. HbA1C: No results for input(s): HGBA1C in the last 72 hours. CBG: Recent Labs  Lab 03/17/17 1828  GLUCAP 130*   Lipid Profile: No results for input(s): CHOL, HDL, LDLCALC, TRIG, CHOLHDL, LDLDIRECT in the last 72 hours. Thyroid Function Tests: No results for input(s): TSH, T4TOTAL, FREET4, T3FREE, THYROIDAB in the last 72 hours. Anemia Panel: No results for input(s):  VITAMINB12, FOLATE, FERRITIN, TIBC, IRON, RETICCTPCT in the last 72 hours. Sepsis Labs: No results for input(s): PROCALCITON, LATICACIDVEN in the last 168 hours.  Recent Results (from the past 240 hour(s))  Blood culture (routine x 2)     Status: None   Collection Time: 03/12/17  1:00 PM  Result Value Ref Range Status   Specimen Description BLOOD LEFT ANTECUBITAL  Final   Special Requests   Final    IN BOTH AEROBIC AND ANAEROBIC BOTTLES Blood Culture adequate volume   Culture NO GROWTH 5 DAYS  Final   Report Status 03/17/2017 FINAL  Final  Blood culture (routine x 2)     Status: None   Collection Time: 03/12/17  1:00 PM  Result Value Ref Range Status   Specimen Description BLOOD RIGHT ANTECUBITAL  Final   Special Requests   Final    IN BOTH AEROBIC AND ANAEROBIC BOTTLES Blood Culture results may not be optimal due to an inadequate volume of blood received in culture bottles   Culture NO GROWTH 5 DAYS  Final   Report Status 03/17/2017 FINAL  Final  Urine culture     Status: None   Collection Time: 03/12/17  1:38 PM  Result Value Ref Range Status   Specimen Description URINE, CLEAN CATCH  Final   Special Requests NONE  Final   Culture NO GROWTH  Final   Report Status 03/13/2017 FINAL  Final  MRSA PCR Screening     Status: None   Collection Time: 03/13/17  1:59 AM  Result Value Ref Range Status   MRSA by PCR NEGATIVE NEGATIVE Final    Comment:        The GeneXpert MRSA Assay (FDA approved for NASAL specimens only), is one component of a comprehensive MRSA colonization surveillance program. It is not intended to diagnose MRSA infection nor to guide or monitor treatment for MRSA infections.      Radiology Studies: Dg Chest Port 1 View  Result Date: 03/19/2017 CLINICAL DATA:  Shortness of Breath EXAM: PORTABLE CHEST 1 VIEW COMPARISON:  03/12/2017 FINDINGS: Cardiomegaly with vascular congestion. Small bilateral effusions with bibasilar and perihilar opacities. This could  reflect edema or infection. No acute bony abnormality. IMPRESSION: Cardiomegaly. Bilateral perihilar and lower lobe edema versus infection. Small bilateral effusions. Electronically Signed   By: Rolm Baptise M.D.   On: 03/19/2017 10:45    Scheduled Meds: . atorvastatin  20 mg Oral q1800  . calcium carbonate  1 tablet Oral TID WC  . collagenase   Topical Daily  . feeding supplement (ENSURE ENLIVE)  237 mL Oral TID BM  . furosemide  40 mg Oral Daily  . levothyroxine  150 mcg Oral QAC breakfast  . metoprolol tartrate  50 mg Oral BID  . multivitamin with minerals  1 tablet Oral Daily  . sodium chloride flush  3 mL Intravenous Q12H  . sodium chloride flush  3 mL Intravenous Q12H   Continuous Infusions: . sodium chloride    . sodium chloride    . heparin 1,000 Units/hr (03/18/17 2223)     LOS: 8 days   Marylu Lund, MD [facility] Pager [phone]  If 7PM-7AM, please contact night-coverage [email or web address] Password [email or web address] 03/20/2017, 1:48 PM

## 2017-03-20 NOTE — Progress Notes (Signed)
Progress Note  Patient Name: Tina West Date of Encounter: 03/20/2017  Primary Cardiologist:  Hilty   Subjective   The patient is an 84 year old female with a history of chronic atrial fibrillation admitted with heart failure symptoms.  Echo showed her ejection fraction to be 15-20%.  Heart catheterization revealed moderate diffuse coronary artery disease but not enough to explain her heart failure symptoms.  Is been relatively noncompliant with her medications.  She is not been to the doctor on a regular basis- both of these for social/financial reasons.  Inpatient Medications    Scheduled Meds: . atorvastatin  20 mg Oral q1800  . calcium carbonate  1 tablet Oral TID WC  . collagenase   Topical Daily  . feeding supplement (ENSURE ENLIVE)  237 mL Oral TID BM  . furosemide  40 mg Oral Daily  . levothyroxine  150 mcg Oral QAC breakfast  . metoprolol tartrate  50 mg Oral BID  . multivitamin with minerals  1 tablet Oral Daily  . sodium chloride flush  3 mL Intravenous Q12H  . sodium chloride flush  3 mL Intravenous Q12H   Continuous Infusions: . sodium chloride    . sodium chloride    . heparin 1,000 Units/hr (03/18/17 2223)   PRN Meds: sodium chloride, sodium chloride, acetaminophen **OR** acetaminophen, ALPRAZolam, alum & mag hydroxide-simeth, diphenhydrAMINE, ondansetron **OR** ondansetron (ZOFRAN) IV, sodium chloride flush, sodium chloride flush   Vital Signs    Vitals:   03/20/17 0859 03/20/17 1022 03/20/17 1029 03/20/17 1200  BP: 119/74   102/63  Pulse: (!) 104   71  Resp:    18  Temp:    97.6 F (36.4 C)  TempSrc:    Oral  SpO2:  99% 99% 98%  Weight:      Height:        Intake/Output Summary (Last 24 hours) at 03/20/2017 1237 Last data filed at 03/20/2017 2706 Gross per 24 hour  Intake 360 ml  Output 200 ml  Net 160 ml   Filed Weights   03/18/17 0531 03/19/17 0626 03/20/17 0453  Weight: 152 lb 12.5 oz (69.3 kg) 166 lb 3.6 oz (75.4 kg) 164 lb 0.4 oz  (74.4 kg)    Telemetry    AF with rapid V response  - Personally Reviewed  ECG     A fib  - Personally Reviewed  Physical Exam   GEN:  Elderly, chronically ill-appearing female.  No acute distress. Neck: No JVD Cardiac:  Irregularly irregular Respiratory: Clear to auscultation bilaterally. GI: Soft, nontender, non-distended  MS: No edema; No deformity. Neuro:  Nonfocal  Psych: Normal affect   Labs    Chemistry Recent Labs  Lab 03/14/17 1108  03/16/17 0555 03/17/17 0416 03/19/17 0745 03/20/17 0709  NA 134*   < > 134* 132* 129* 131*  K 3.3*   < > 3.8 4.1 4.4 5.1  CL 96*   < > 89* 87* 87* 89*  CO2 25   < > 33* 33* 31 35*  GLUCOSE 96   < > 150* 138* 146* 133*  BUN 31*   < > 38* 40* 54* 60*  CREATININE 0.99   < > 0.76 0.68 0.87 0.92  CALCIUM 8.3*   < > 9.0 9.0 8.6* 9.0  PROT 5.7*  --  5.7*  --   --   --   ALBUMIN 3.2*  --  3.3*  --   --   --   AST 68*  --  52*  --   --   --  ALT 85*  --  75*  --   --   --   ALKPHOS 62  --  78  --   --   --   BILITOT 1.1  --  1.2  --   --   --   GFRNONAA 50*   < > >60 >60 59* 55*  GFRAA 58*   < > >60 >60 >60 >60  ANIONGAP 13   < > 12 12 11 7    < > = values in this interval not displayed.     Hematology Recent Labs  Lab 03/17/17 0416 03/19/17 0745 03/20/17 0709  WBC 13.1* 12.4* 11.1*  RBC 4.42 4.31 4.50  HGB 11.2* 11.0* 11.4*  HCT 37.0  36.8 36.1 37.2  MCV 83.7 83.8 82.7  MCH 25.3* 25.5* 25.3*  MCHC 30.3 30.5 30.6  RDW 15.6* 15.6* 15.5  PLT 586* 612* 592*    Cardiac EnzymesNo results for input(s): TROPONINI in the last 168 hours. No results for input(s): TROPIPOC in the last 168 hours.   BNPNo results for input(s): BNP, PROBNP in the last 168 hours.   DDimer No results for input(s): DDIMER in the last 168 hours.   Radiology    Dg Chest Port 1 View  Result Date: 03/19/2017 CLINICAL DATA:  Shortness of Breath EXAM: PORTABLE CHEST 1 VIEW COMPARISON:  03/12/2017 FINDINGS: Cardiomegaly with vascular congestion.  Small bilateral effusions with bibasilar and perihilar opacities. This could reflect edema or infection. No acute bony abnormality. IMPRESSION: Cardiomegaly. Bilateral perihilar and lower lobe edema versus infection. Small bilateral effusions. Electronically Signed   By: Rolm Baptise M.D.   On: 03/19/2017 10:45    Cardiac Studies     Patient Profile     84 y.o. female with acute on chronic systolic and diastolic congestive heart failure.  Atrial fibrillation  Assessment & Plan    1.  Acute on chronic combined systolic and diastolic congestive heart failure: Continue with medical therapy.  She has atrial fibrillation.  Her rate is a little fast today.  We will consider increasing metoprolol.  Blood pressure is still fairly low and I would hesitate to start ARB at this time. Continue Lasix.   For questions or updates, please contact [place] Please consult [email or web address] for contact info under Cardiology/STEMI.      Signed, Mertie Moores, MD  03/20/2017, 12:37 PM

## 2017-03-20 NOTE — Progress Notes (Signed)
[facility] for Heparin Indication: atrial fibrillation  Allergies  Allergen Reactions  . Codeine Hives  . Contrast Media [Iodinated Diagnostic Agents] Other (See Comments)    unspecified  . Diazepam Hives  . Meperidine Hcl Other (See Comments)    unspecified  . Morphine Other (See Comments)    "caused heart attack"    Patient Measurements: Height: 5\' 9"  (175.3 cm) Weight: 164 lb 0.4 oz (74.4 kg) IBW/kg (Calculated) : 66.2 Heparin Dosing Weight:   Vital Signs: Temp: 97.5 F (36.4 C) (01/04 0453) Temp Source: Oral (01/04 0453) BP: 119/74 (01/04 0859) Pulse Rate: 104 (01/04 0859)  Labs: Recent Labs    03/18/17 0650 03/19/17 0745 03/20/17 0709 03/20/17 0713  HGB  --  11.0* 11.4*  --   HCT  --  36.1 37.2  --   PLT  --  612* 592*  --   LABPROT 13.5  --   --   --   INR 1.04  --   --   --   HEPARINUNFRC 0.90* 0.45  --  0.54  CREATININE  --  0.87 0.92  --     Estimated Creatinine Clearance: 45.9 mL/min (by C-G formula based on SCr of 0.92 mg/dL).   Medical History: Past Medical History:  Diagnosis Date  . A-fib ([place])   . Abdominal cyst    Multiple surgeries in [place]. "Heart Stopped" on the operating table during one of her "cyst" operations, probably 15-20 years ago  . Anxiety   . Diastolic CHF, acute (H[facility])    BNP 179 in 1/11. Echo (1/11) with EF 60%, mild focal basal septal hypertrophy, grade I diastolic dysfunction, mild left atrial enlargement.   Marland Kitchen HTN (hypertension)   . Hypothyroidism   . Obesity   . PAD (peripheral artery disease) ([place]) 03/16/2017  . S/P TAH (total abdominal hysterectomy)     Assessment: Anticoag: Heparin for afib (CHADSVASC= 5) resumed s/p cath 1/2. no PTA AC > possible DOAC after cath. HL 0.54 CBC stable.  Goal of Therapy:  Heparin level 0.3-0.7 units/ml Monitor platelets by anticoagulation protocol: Yes   Plan:  Continue heparin gtt at 1000 units/hr Daily heparin level and CBC F/u  decision for long-term oral anticoagulation   Kolbe Delmonaco S. Alford Highland, PharmD, [place] Clinical Staff Pharmacist Pager [phone]  Eilene Ghazi Stillinger 03/20/2017,10:06 AM

## 2017-03-20 NOTE — Telephone Encounter (Signed)
Patient still currently in the hospital, pending discharge will call to schedule appointment.

## 2017-03-21 LAB — BASIC METABOLIC PANEL
ANION GAP: 14 (ref 5–15)
BUN: 57 mg/dL — AB (ref 6–20)
CALCIUM: 8.8 mg/dL — AB (ref 8.9–10.3)
CO2: 34 mmol/L — ABNORMAL HIGH (ref 22–32)
Chloride: 87 mmol/L — ABNORMAL LOW (ref 101–111)
Creatinine, Ser: 1.02 mg/dL — ABNORMAL HIGH (ref 0.44–1.00)
GFR calc Af Amer: 56 mL/min — ABNORMAL LOW (ref >60)
GFR, EST NON AFRICAN AMERICAN: 48 mL/min — AB (ref >60)
GLUCOSE: 212 mg/dL — AB (ref 65–99)
Potassium: 5.4 mmol/L — ABNORMAL HIGH (ref 3.5–5.1)
SODIUM: 135 mmol/L (ref 135–145)

## 2017-03-21 LAB — CBC
HCT: 37.9 % (ref 36.0–46.0)
Hemoglobin: 11 g/dL — ABNORMAL LOW (ref 12.0–15.0)
MCH: 24.3 pg — ABNORMAL LOW (ref 26.0–34.0)
MCHC: 29 g/dL — ABNORMAL LOW (ref 30.0–36.0)
MCV: 83.7 fL (ref 78.0–100.0)
PLATELETS: 609 10*3/uL — AB (ref 150–400)
RBC: 4.53 MIL/uL (ref 3.87–5.11)
RDW: 15.3 % (ref 11.5–15.5)
WBC: 14.9 10*3/uL — ABNORMAL HIGH (ref 4.0–10.5)

## 2017-03-21 LAB — HEPARIN LEVEL (UNFRACTIONATED)
Heparin Unfractionated: 0.75 IU/mL — ABNORMAL HIGH (ref 0.30–0.70)
Heparin Unfractionated: 0.43 IU/mL (ref 0.30–0.70)

## 2017-03-21 LAB — GLUCOSE, CAPILLARY: GLUCOSE-CAPILLARY: 185 mg/dL — AB (ref 65–99)

## 2017-03-21 MED ORDER — DIGOXIN 0.25 MG/ML IJ SOLN
0.2500 mg | Freq: Four times a day (QID) | INTRAMUSCULAR | Status: AC
  Administered 2017-03-21 (×3): 0.25 mg via INTRAVENOUS
  Filled 2017-03-21 (×3): qty 2

## 2017-03-21 MED ORDER — DILTIAZEM HCL 25 MG/5ML IV SOLN
5.0000 mg | Freq: Once | INTRAVENOUS | Status: AC
  Administered 2017-03-21: 5 mg via INTRAVENOUS
  Filled 2017-03-21: qty 5

## 2017-03-21 MED ORDER — SODIUM CHLORIDE 0.9 % IV BOLUS (SEPSIS)
500.0000 mL | Freq: Once | INTRAVENOUS | Status: AC
  Administered 2017-03-21: 500 mL via INTRAVENOUS

## 2017-03-21 MED ORDER — DIGOXIN 0.25 MG/ML IJ SOLN
0.2500 mg | Freq: Once | INTRAMUSCULAR | Status: AC
  Administered 2017-03-21: 0.25 mg via INTRAVENOUS
  Filled 2017-03-21: qty 2

## 2017-03-21 NOTE — Progress Notes (Signed)
Progress Note  Patient Name: Tina West Date of Encounter: 03/21/2017  Primary Cardiologist: Hilty  Subjective   Some ongoing SOB.   Inpatient Medications    Scheduled Meds: . atorvastatin  20 mg Oral q1800  . calcium carbonate  1 tablet Oral TID WC  . collagenase   Topical Daily  . feeding supplement (ENSURE ENLIVE)  237 mL Oral TID BM  . furosemide  40 mg Oral Daily  . levothyroxine  150 mcg Oral QAC breakfast  . metoprolol tartrate  50 mg Oral BID  . multivitamin with minerals  1 tablet Oral Daily  . sodium chloride flush  3 mL Intravenous Q12H  . sodium chloride flush  3 mL Intravenous Q12H   Continuous Infusions: . sodium chloride    . sodium chloride    . heparin 1,000 Units/hr (03/20/17 1730)   PRN Meds: sodium chloride, sodium chloride, acetaminophen **OR** acetaminophen, ALPRAZolam, alum & mag hydroxide-simeth, diphenhydrAMINE, ondansetron **OR** ondansetron (ZOFRAN) IV, sodium chloride flush, sodium chloride flush   Vital Signs    Vitals:   03/21/17 0444 03/21/17 0500 03/21/17 0504 03/21/17 0656  BP: 123/82     Pulse: (!) 144 95 77   Resp:      Temp:      TempSrc:      SpO2: 99% 100% 100%   Weight:    164 lb 14.5 oz (74.8 kg)  Height:        Intake/Output Summary (Last 24 hours) at 03/21/2017 0800 Last data filed at 03/21/2017 0730 Gross per 24 hour  Intake 954.17 ml  Output 1475 ml  Net -520.83 ml   Filed Weights   03/19/17 0626 03/20/17 0453 03/21/17 0656  Weight: 166 lb 3.6 oz (75.4 kg) 164 lb 0.4 oz (74.4 kg) 164 lb 14.5 oz (74.8 kg)    Telemetry    afib with RVR - Personally Reviewed  ECG    n/a  Physical Exam   GEN: No acute distress.   Neck: irreg Cardiac:irreg, 3/6 sysotlic murmur at apex  Respiratory: Clear to auscultation bilaterally. GI: Soft, nontender, non-distended  MS: No edema; No deformity. Neuro:  Nonfocal  Psych: Normal affect   Labs    Chemistry Recent Labs  Lab 03/14/17 1108  03/16/17 0555  03/17/17 0416 03/19/17 0745 03/20/17 0709  NA 134*   < > 134* 132* 129* 131*  K 3.3*   < > 3.8 4.1 4.4 5.1  CL 96*   < > 89* 87* 87* 89*  CO2 25   < > 33* 33* 31 35*  GLUCOSE 96   < > 150* 138* 146* 133*  BUN 31*   < > 38* 40* 54* 60*  CREATININE 0.99   < > 0.76 0.68 0.87 0.92  CALCIUM 8.3*   < > 9.0 9.0 8.6* 9.0  PROT 5.7*  --  5.7*  --   --   --   ALBUMIN 3.2*  --  3.3*  --   --   --   AST 68*  --  52*  --   --   --   ALT 85*  --  75*  --   --   --   ALKPHOS 62  --  78  --   --   --   BILITOT 1.1  --  1.2  --   --   --   GFRNONAA 50*   < > >60 >60 59* 55*  GFRAA 58*   < > >60 >60 >60 >  60  ANIONGAP 13   < > 12 12 11 7    < > = values in this interval not displayed.     Hematology Recent Labs  Lab 03/17/17 0416 03/19/17 0745 03/20/17 0709  WBC 13.1* 12.4* 11.1*  RBC 4.42 4.31 4.50  HGB 11.2* 11.0* 11.4*  HCT 37.0  36.8 36.1 37.2  MCV 83.7 83.8 82.7  MCH 25.3* 25.5* 25.3*  MCHC 30.3 30.5 30.6  RDW 15.6* 15.6* 15.5  PLT 586* 612* 592*    Cardiac EnzymesNo results for input(s): TROPONINI in the last 168 hours. No results for input(s): TROPIPOC in the last 168 hours.   BNPNo results for input(s): BNP, PROBNP in the last 168 hours.   DDimer No results for input(s): DDIMER in the last 168 hours.   Radiology    Dg Chest Port 1 View  Result Date: 03/19/2017 CLINICAL DATA:  Shortness of Breath EXAM: PORTABLE CHEST 1 VIEW COMPARISON:  03/12/2017 FINDINGS: Cardiomegaly with vascular congestion. Small bilateral effusions with bibasilar and perihilar opacities. This could reflect edema or infection. No acute bony abnormality. IMPRESSION: Cardiomegaly. Bilateral perihilar and lower lobe edema versus infection. Small bilateral effusions. Electronically Signed   By: Rolm Baptise M.D.   On: 03/19/2017 10:45    Cardiac Studies    Patient Profile     84 y.o. female with acute on chronic systolic and diastolic congestive heart failure.  Atrial fibrillation    Assessment &  Plan    1. Acute on chronic combined systolic/diastolic HF - 93/8182 echo LVEF 15-20%, cannot eval diastolic function due to afib. Moderate RV dysfunction, PASP 54 - Jan 2019 cath: mild to moderate CAD out of proportion to CHF. Mean PA 27, PCWP 29, CI 1.61, LVEDP 20 - negative 546mL yesterday, negative 3 liters since admission. She is on oral lasix 40mg  daily, mild uptrend in Cr and BUN - medical therapy with lopressor 50mg  bid. Low bp's at times, have not started ACE-I yet. Consolidated to long acting Toprol once hemodynamica settle.   2. Afib - irregular wide complex rhythm due to her LBBB - elevated rates, with her low CI would not be overally aggressive titrating her beta blocker - will start digoxin load today, 0.25mg  IV q6 hr x 3 doses. Start oral maintence likely tomorrow.  - CHADS2Vasc score is 78 (age x2, gender, CHF). Currently on heparin drip, oral therapy pending social work assistance. Patient has no regular transporations, cost is major issue. Patient and family also reports several falls, with patient previously striking her head. Make final decision on anticoag at discharge, however at this time likely would not continue as outpatient.    3. Social - difficultly affording meds and making appointments, followed by case management.    For questions or updates, please contact [place] Please consult [email or web address] for contact info under Cardiology/STEMI.      Merrily Pew, MD  03/21/2017, 8:00 AM

## 2017-03-21 NOTE — Progress Notes (Signed)
Pt may need to take a smaller amt of Xanax while in the hospital, pt takes 0.25, instead of 0.5mg , does not seem to tolerate as well the 0.5mg  dose, will continue to monitor, Thanks Arvella Nigh RN.

## 2017-03-21 NOTE — Progress Notes (Signed)
[place] for Heparin Indication: atrial fibrillation  Allergies  Allergen Reactions  . Codeine Hives  . Contrast Media [Iodinated Diagnostic Agents] Other (See Comments)    unspecified  . Diazepam Hives  . Meperidine Hcl Other (See Comments)    unspecified  . Morphine Other (See Comments)    "caused heart attack"   Patient Measurements: Height: 5\' 9"  (175.3 cm) Weight: 164 lb 14.5 oz (74.8 kg) IBW/kg (Calculated) : 66.2  Vital Signs: Temp: 98.6 F (37 C) (01/05 1959) Temp Source: Oral (01/05 1959) BP: 104/62 (01/05 1959) Pulse Rate: 101 (01/05 1959)  Labs: Recent Labs    03/19/17 0745 03/20/17 0709 03/20/17 0713 03/21/17 0908 03/21/17 2014  HGB 11.0* 11.4*  --  11.0*  --   HCT 36.1 37.2  --  37.9  --   PLT 612* 592*  --  609*  --   HEPARINUNFRC 0.45  --  0.54 0.75* 0.43  CREATININE 0.87 0.92  --  1.02*  --    Estimated Creatinine Clearance: 41.4 mL/min (A) (by C-G formula based on SCr of 1.02 mg/dL (H)).  Medical History: Past Medical History:  Diagnosis Date  . A-fib ([place])   . Abdominal cyst    Multiple surgeries in [place]. "Heart Stopped" on the operating table during one of her "cyst" operations, probably 15-20 years ago  . Anxiety   . Diastolic CHF, acute (HCC)    BNP 179 in 1/11. Echo (1/11) with EF 60%, mild focal basal septal hypertrophy, grade I diastolic dysfunction, mild left atrial enlargement.   Marland Kitchen HTN (hypertension)   . Hypothyroidism   . Obesity   . PAD (peripheral artery disease) ([place]) 03/16/2017  . S/P TAH (total abdominal hysterectomy)    Assessment: Anticoag: Heparin for afib (CHADSVASC= 5) resumed s/p cath 1/2. no PTA AC > possible DOAC after cath.   Heparin level therapeutic: 0.43; CBC stable. No bleeding documented; per Cardiology note pt is likely not a candidate for outpatient anticoagulation.   Goal of Therapy:  Heparin level 0.3-0.7 units/ml Monitor platelets by anticoagulation protocol:  Yes   Plan:  Continue heparin gtt to 900 units/hr Daily heparin level and CBC F/u decision for long-term oral anticoagulation  Georga Bora, PharmD Clinical Pharmacist 03/21/2017 9:15 PM

## 2017-03-21 NOTE — Progress Notes (Signed)
Pt had a dose of Xanax 0.5mg  to help pt get some sleep around 0230.  Pt started c/o indigestion around 3 am or so and some N, RN gave pt Zofran IV 4mg  and Maalox to help with this, shortly after this pt started acting very sleepy and hard to arouse, pt 's HR was in the 150's sustained, pt was very pale got her BP it was 162/85 and HR 155 sustaining, RN called other RN's to assess, called rapid response and put pt on nonrebreather at 15L, pt was very cold and had poor perfusion.  Did an ECG and it showed Afib with RVR.  Rapid Response RN came to assess and help out, called pt's MD to get orders for IV heart meds, O2 sats 93, on nonrebreather.  MD ordered Digoxin and NS bolus of 500, which HR was still in the 150's.  Her O2 sats were getting better at 100% on a nonrebreather at 15, later we were able to go down to 10L and then later [place] 6L,  MD was called again and ordered Cardizem 5mg  IV bolus which caused her HR to go down to 95 which was more controlled and pt stated she did feel better.  Since the cardizem pt HR stayed in the 80-90's again a lot more controlled. Pt may need to be on O2 continous, especially at night, will continue to monitor, Thanks, Buckner [place].

## 2017-03-21 NOTE — Progress Notes (Signed)
PROGRESS NOTE    Tina West  NIO:[id number] DOB: 1928-05-26 DOA: 03/12/2017 PCP: Claretta Fraise, MD    Brief Narrative:  84 year old female with history of atrial fibrillation, diastolic CHF, hypothyroidism and anxiety presented with worsening shortness of breath for 2 weeks.  Patient was admitted for atrial fibrillation with rapid ventricular rate and elevated troponin.  Cardiology was consulted.  Echo showed ejection fraction of 15-20%.  Plan for catheterization today.  Heart rate control has been difficult; responded well to diuresis.  Assessment & Plan:   Principal Problem:   A-fib ([place]) Active Problems:   Essential hypertension   Left leg cellulitis   Hypothyroidism   Demand ischemia (HCC)   Acute on chronic diastolic (congestive) heart failure (HCC)   Thrombocytosis (HCC)   Protein-calorie malnutrition, severe   Acute systolic CHF (congestive heart failure) (HCC)   PAD (peripheral artery disease) (HCC)  Atrial fibrillation with rapid ventricular rate - CHA2DS2-VASc5. -rate control had been noted to be difficult to control.  Patient currently on metoprolol as per cardiology. Patient is continued on heparin drip  - Needs anticoagulation after cath but financial limitations may limit NOAC use and non-compliance makes warfarin suboptimal. Cardiology is following. IV digoxin ordered today for Cardiology  New cardiomyopathy with LVEF 81-82%, acute systolic CHF (new), acute on chronic diastolic CHF with anasarca and associated demand ischemia  -Improving.  Strict input and output.  Daily weights.  Continue metoprolol.  Cardiology is following and pt is s/p heart cath 03/18/17 with findings of 20% ost LM to mid LM, 50%mid LAD, 50% ost 2nd diagonal, 25% 1st mrg to prox LAD, 80% 1st marg -Recommendation for continued aggressive management for heart failure as per Cardiology  Possible cellulitis left foot with ulcerations noted present on admission(probably secondary to  ruptured bulla but do suspect PAD). No evidence of acute limb ischemia. - appreciate vascular surgery eval - Course of Keflexcompleted. Cellulitis noted to have resolved.  Bilateral moderate peripheral vascular disease with non-compressible tibilal arteries -Vascular surgery medications: Pt is NOT a surgical candidate at this point with acute changes in cardiac function - At this point, manage the feet with wound care:  Continue Santyl as currently ordered  Compression once necrotic skin debrided  Dr. Jess Barters office will contact patient to arrange for outpatient follow-up forsilver impregnated compression stockings(have been in contact with scheduler Van)  Hypothyroidism - Will continue levothyroxine .  Thrombocytosis, chronic -trending down;f/u as an outpatient -Repeat CBC in AM  Aortic atherosclerosis  - continue statin as tolerated  Severe malnutrition -Continue to follow nutrition recommendations  DVT prophylaxis: Heparin gtt Code Status: Full Family Communication: Pt in room, family at bedside Disposition Plan: SNF, timing uncertain  Consultants:   Cardiology  Procedures:   Echo Study Conclusions  - Left ventricle: The cavity size was mildly dilated. There was moderate concentric hypertrophy. Systolic function was severely reduced. The estimated ejection fraction was in the range of 15% to 20%. Diffuse hypokinesis. The study was not technically sufficient to allow evaluation of LV diastolic dysfunction due to atrial fibrillation. - Ventricular septum: Septal motion showed paradox. - Aortic valve: Valve mobility was restricted. - Aortic root: The aortic root was normal in size. - Mitral valve: Calcified annulus. Mildly thickened leaflets . There was mild regurgitation. - Left atrium: The atrium was moderately dilated. - Right ventricle: The cavity size was severely dilated. Wall thickness was normal. Systolic function was  moderately reduced. - Right atrium: The atrium was moderately dilated. - Tricuspid valve: There  was moderate regurgitation. - Pulmonary arteries: Systolic pressure was moderately increased. PA peak pressure: 54 mm Hg (S). - Inferior vena cava: The vessel was dilated. The respirophasic diameter changes were blunted (<50%), consistent with elevated central venous pressure. - Pericardium, extracardiac: There was no pericardial effusion  -Heart cath 03/18/17  Antimicrobials: Anti-infectives (From admission, onward)   Start     Dose/Rate Route Frequency Ordered Stop   03/15/17 1245  cephALEXin (KEFLEX) capsule 500 mg     500 mg Oral Every 12 hours 03/15/17 1230 03/16/17 2100   03/13/17 1530  ceFAZolin (ANCEF) IVPB 1 g/50 mL premix  Status:  Discontinued     1 g 100 mL/hr over 30 Minutes Intravenous Every 8 hours 03/13/17 1423 03/15/17 1230   03/12/17 1500  ceFAZolin (ANCEF) IVPB 1 g/50 mL premix     1 g 100 mL/hr over 30 Minutes Intravenous  Once 03/12/17 1448 03/12/17 1616      Subjective: Without complaints  Objective: Vitals:   03/21/17 0656 03/21/17 0846 03/21/17 0856 03/21/17 1122  BP:  (!) 135/98  (!) 156/114  Pulse:  (!) 112 (!) 130 99  Resp:      Temp:    98.2 F (36.8 C)  TempSrc:    Oral  SpO2:  100%  99%  Weight: 74.8 kg (164 lb 14.5 oz)     Height:        Intake/Output Summary (Last 24 hours) at 03/21/2017 1548 Last data filed at 03/21/2017 1351 Gross per 24 hour  Intake 845.17 ml  Output 975 ml  Net -129.83 ml   Filed Weights   03/19/17 0626 03/20/17 0453 03/21/17 0656  Weight: 75.4 kg (166 lb 3.6 oz) 74.4 kg (164 lb 0.4 oz) 74.8 kg (164 lb 14.5 oz)    Examination: General exam: Awake, laying in bed, in nad Respiratory system: Normal respiratory effort, no wheezing Cardiovascular system: regular rate, s1, s2 Gastrointestinal system: Soft, nondistended, positive BS Central nervous system: CN2-12 grossly intact, strength intact Extremities:  Perfused, no clubbing Skin: Normal skin turgor, no notable skin lesions seen Psychiatry: Mood normal // no visual hallucinations   Data Reviewed: I have personally reviewed following labs and imaging studies  CBC: Recent Labs  Lab 03/16/17 0555 03/17/17 0416 03/19/17 0745 03/20/17 0709 03/21/17 0908  WBC 13.1* 13.1* 12.4* 11.1* 14.9*  HGB 11.3* 11.2* 11.0* 11.4* 11.0*  HCT 37.5 37.0  36.8 36.1 37.2 37.9  MCV 84.1 83.7 83.8 82.7 83.7  PLT 615* 586* 612* 592* 761*   Basic Metabolic Panel: Recent Labs  Lab 03/16/17 0555 03/17/17 0416 03/19/17 0745 03/20/17 0709 03/21/17 0908  NA 134* 132* 129* 131* 135  K 3.8 4.1 4.4 5.1 5.4*  CL 89* 87* 87* 89* 87*  CO2 33* 33* 31 35* 34*  GLUCOSE 150* 138* 146* 133* 212*  BUN 38* 40* 54* 60* 57*  CREATININE 0.76 0.68 0.87 0.92 1.02*  CALCIUM 9.0 9.0 8.6* 9.0 8.8*  MG  --  2.1 2.2  --   --    GFR: Estimated Creatinine Clearance: 41.4 mL/min (A) (by C-G formula based on SCr of 1.02 mg/dL (H)). Liver Function Tests: Recent Labs  Lab 03/16/17 0555  AST 52*  ALT 75*  ALKPHOS 78  BILITOT 1.2  PROT 5.7*  ALBUMIN 3.3*   No results for input(s): LIPASE, AMYLASE in the last 168 hours. No results for input(s): AMMONIA in the last 168 hours. Coagulation Profile: Recent Labs  Lab 03/18/17 0650  INR 1.04  Cardiac Enzymes: No results for input(s): CKTOTAL, CKMB, CKMBINDEX, TROPONINI in the last 168 hours. BNP (last 3 results) No results for input(s): PROBNP in the last 8760 hours. HbA1C: No results for input(s): HGBA1C in the last 72 hours. CBG: Recent Labs  Lab 03/17/17 1828 03/21/17 0343  GLUCAP 130* 185*   Lipid Profile: No results for input(s): CHOL, HDL, LDLCALC, TRIG, CHOLHDL, LDLDIRECT in the last 72 hours. Thyroid Function Tests: No results for input(s): TSH, T4TOTAL, FREET4, T3FREE, THYROIDAB in the last 72 hours. Anemia Panel: No results for input(s): VITAMINB12, FOLATE, FERRITIN, TIBC, IRON, RETICCTPCT in the  last 72 hours. Sepsis Labs: No results for input(s): PROCALCITON, LATICACIDVEN in the last 168 hours.  Recent Results (from the past 240 hour(s))  Blood culture (routine x 2)     Status: None   Collection Time: 03/12/17  1:00 PM  Result Value Ref Range Status   Specimen Description BLOOD LEFT ANTECUBITAL  Final   Special Requests   Final    IN BOTH AEROBIC AND ANAEROBIC BOTTLES Blood Culture adequate volume   Culture NO GROWTH 5 DAYS  Final   Report Status 03/17/2017 FINAL  Final  Blood culture (routine x 2)     Status: None   Collection Time: 03/12/17  1:00 PM  Result Value Ref Range Status   Specimen Description BLOOD RIGHT ANTECUBITAL  Final   Special Requests   Final    IN BOTH AEROBIC AND ANAEROBIC BOTTLES Blood Culture results may not be optimal due to an inadequate volume of blood received in culture bottles   Culture NO GROWTH 5 DAYS  Final   Report Status 03/17/2017 FINAL  Final  Urine culture     Status: None   Collection Time: 03/12/17  1:38 PM  Result Value Ref Range Status   Specimen Description URINE, CLEAN CATCH  Final   Special Requests NONE  Final   Culture NO GROWTH  Final   Report Status 03/13/2017 FINAL  Final  MRSA PCR Screening     Status: None   Collection Time: 03/13/17  1:59 AM  Result Value Ref Range Status   MRSA by PCR NEGATIVE NEGATIVE Final    Comment:        The GeneXpert MRSA Assay (FDA approved for NASAL specimens only), is one component of a comprehensive MRSA colonization surveillance program. It is not intended to diagnose MRSA infection nor to guide or monitor treatment for MRSA infections.      Radiology Studies: No results found.  Scheduled Meds: . atorvastatin  20 mg Oral q1800  . calcium carbonate  1 tablet Oral TID WC  . collagenase   Topical Daily  . digoxin  0.25 mg Intravenous Q6H  . feeding supplement (ENSURE ENLIVE)  237 mL Oral TID BM  . furosemide  40 mg Oral Daily  . levothyroxine  150 mcg Oral QAC breakfast  .  metoprolol tartrate  50 mg Oral BID  . multivitamin with minerals  1 tablet Oral Daily  . sodium chloride flush  3 mL Intravenous Q12H  . sodium chloride flush  3 mL Intravenous Q12H   Continuous Infusions: . sodium chloride    . sodium chloride    . heparin 900 Units/hr (03/21/17 1148)     LOS: 9 days   Marylu Lund, MD [facility] Pager [phone]  If 7PM-7AM, please contact night-coverage [email or web address] Password [facility] 03/21/2017, 3:48 PM

## 2017-03-21 NOTE — Progress Notes (Signed)
[place] for Heparin Indication: atrial fibrillation  Allergies  Allergen Reactions  . Codeine Hives  . Contrast Media [Iodinated Diagnostic Agents] Other (See Comments)    unspecified  . Diazepam Hives  . Meperidine Hcl Other (See Comments)    unspecified  . Morphine Other (See Comments)    "caused heart attack"   Patient Measurements: Height: 5\' 9"  (175.3 cm) Weight: 164 lb 14.5 oz (74.8 kg) IBW/kg (Calculated) : 66.2 Heparin Dosing Weight:   Vital Signs: BP: 135/98 (01/05 0846) Pulse Rate: 130 (01/05 0856)  Labs: Recent Labs    03/19/17 0745 03/20/17 0709 03/20/17 0713 03/21/17 0908  HGB 11.0* 11.4*  --  11.0*  HCT 36.1 37.2  --  37.9  PLT 612* 592*  --  609*  HEPARINUNFRC 0.45  --  0.54 0.75*  CREATININE 0.87 0.92  --  1.02*   Estimated Creatinine Clearance: 41.4 mL/min (A) (by C-G formula based on SCr of 1.02 mg/dL (H)).  Medical History: Past Medical History:  Diagnosis Date  . A-fib ([place])   . Abdominal cyst    Multiple surgeries in [place]. "Heart Stopped" on the operating table during one of her "cyst" operations, probably 15-20 years ago  . Anxiety   . Diastolic CHF, acute (HCC)    BNP 179 in 1/11. Echo (1/11) with EF 60%, mild focal basal septal hypertrophy, grade I diastolic dysfunction, mild left atrial enlargement.   Marland Kitchen HTN (hypertension)   . Hypothyroidism   . Obesity   . PAD (peripheral artery disease) ([place]) 03/16/2017  . S/P TAH (total abdominal hysterectomy)    Assessment: Anticoag: Heparin for afib (CHADSVASC= 5) resumed s/p cath 1/2. no PTA AC > possible DOAC after cath. HL 0.75 above goal; CBC stable. No bleeding reported by RN, per Cardiology note pt is likely not a candidate for outpatient anticoagulation.   Goal of Therapy:  Heparin level 0.3-0.7 units/ml Monitor platelets by anticoagulation protocol: Yes   Plan:  Reduce heparin gtt to 900 units/hr Daily heparin level and CBC F/u decision  for long-term oral anticoagulation  Georga Bora, PharmD Clinical Pharmacist 03/21/2017 11:08 AM

## 2017-03-21 NOTE — Progress Notes (Signed)
CSW following patient for support and discharge needs. Patient has bed at [facility] and is able to discharge to facility once medically cleared by MD. Authorization has been received by facility.   Rhea Pink, MSW,  [place]

## 2017-03-22 LAB — BASIC METABOLIC PANEL
Anion gap: 6 (ref 5–15)
BUN: 54 mg/dL — ABNORMAL HIGH (ref 6–20)
CALCIUM: 8.5 mg/dL — AB (ref 8.9–10.3)
CO2: 35 mmol/L — AB (ref 22–32)
CREATININE: 0.88 mg/dL (ref 0.44–1.00)
Chloride: 89 mmol/L — ABNORMAL LOW (ref 101–111)
GFR calc Af Amer: >60 mL/min (ref >60)
GFR, EST NON AFRICAN AMERICAN: 58 mL/min — AB (ref >60)
GLUCOSE: 103 mg/dL — AB (ref 65–99)
Potassium: 5.5 mmol/L — ABNORMAL HIGH (ref 3.5–5.1)
Sodium: 130 mmol/L — ABNORMAL LOW (ref 135–145)

## 2017-03-22 LAB — CBC
HCT: 31.6 % — ABNORMAL LOW (ref 36.0–46.0)
Hemoglobin: 9.1 g/dL — ABNORMAL LOW (ref 12.0–15.0)
MCH: 24.4 pg — ABNORMAL LOW (ref 26.0–34.0)
MCHC: 28.8 g/dL — AB (ref 30.0–36.0)
MCV: 84.7 fL (ref 78.0–100.0)
PLATELETS: 495 10*3/uL — AB (ref 150–400)
RBC: 3.73 MIL/uL — ABNORMAL LOW (ref 3.87–5.11)
RDW: 15.1 % (ref 11.5–15.5)
WBC: 10.8 10*3/uL — ABNORMAL HIGH (ref 4.0–10.5)

## 2017-03-22 LAB — HEPARIN LEVEL (UNFRACTIONATED): Heparin Unfractionated: 0.32 IU/mL (ref 0.30–0.70)

## 2017-03-22 MED ORDER — DIGOXIN 125 MCG PO TABS
0.1250 mg | ORAL_TABLET | Freq: Every day | ORAL | Status: DC
  Administered 2017-03-22 – 2017-03-27 (×6): 0.125 mg via ORAL
  Filled 2017-03-22 (×6): qty 1

## 2017-03-22 NOTE — Plan of Care (Signed)
  Progressing Clinical Measurements: Ability to maintain clinical measurements within normal limits will improve 03/22/2017 0224 - Progressing by Ruben Im, RN Will remain free from infection 03/22/2017 0224 - Progressing by Ruben Im, RN Diagnostic test results will improve 03/22/2017 0224 - Progressing by Ruben Im, RN Respiratory complications will improve 03/22/2017 0224 - Progressing by Ruben Im, RN Cardiovascular complication will be avoided 03/22/2017 0224 - Progressing by Ruben Im, RN Activity: Risk for activity intolerance will decrease 03/22/2017 0224 - Progressing by Ruben Im, RN Safety: Ability to remain free from injury will improve 03/22/2017 0224 - Progressing by Ruben Im, RN Skin Integrity: Risk for impaired skin integrity will decrease 03/22/2017 0224 - Progressing by Ruben Im, RN

## 2017-03-22 NOTE — Progress Notes (Signed)
[place] for Heparin Indication: atrial fibrillation  Allergies  Allergen Reactions  . Codeine Hives  . Contrast Media [Iodinated Diagnostic Agents] Other (See Comments)    unspecified  . Diazepam Hives  . Meperidine Hcl Other (See Comments)    unspecified  . Morphine Other (See Comments)    "caused heart attack"   Patient Measurements: Height: 5\' 9"  (175.3 cm) Weight: 160 lb 4.4 oz (72.7 kg)(bed scale) IBW/kg (Calculated) : 66.2  Vital Signs: Temp: 99 F (37.2 C) (01/06 0516) Temp Source: Oral (01/06 0516) BP: 103/53 (01/06 0516) Pulse Rate: 101 (01/06 0516)  Labs: Recent Labs    03/20/17 0709  03/21/17 0908 03/21/17 2014 03/22/17 0456  HGB 11.4*  --  11.0*  --  9.1*  HCT 37.2  --  37.9  --  31.6*  PLT 592*  --  609*  --  495*  HEPARINUNFRC  --    < > 0.75* 0.43 0.32  CREATININE 0.92  --  1.02*  --  0.88   < > = values in this interval not displayed.   Estimated Creatinine Clearance: 48 mL/min (by C-G formula based on SCr of 0.88 mg/dL).  Medical History: Past Medical History:  Diagnosis Date  . A-fib ([place])   . Abdominal cyst    Multiple surgeries in [place]. "Heart Stopped" on the operating table during one of her "cyst" operations, probably 15-20 years ago  . Anxiety   . Diastolic CHF, acute (HCC)    BNP 179 in 1/11. Echo (1/11) with EF 60%, mild focal basal septal hypertrophy, grade I diastolic dysfunction, mild left atrial enlargement.   Marland Kitchen HTN (hypertension)   . Hypothyroidism   . Obesity   . PAD (peripheral artery disease) ([place]) 03/16/2017  . S/P TAH (total abdominal hysterectomy)    Assessment: Anticoag: Heparin for afib (CHADSVASC= 5) resumed s/p cath 1/2. no PTA AC > possible DOAC after cath.   Heparin level therapeutic: 0.43>0.32; Hgb trending down 11>9.1, Plt 609>495. No bleeding documented; per Cardiology note pt is likely not a candidate for outpatient anticoagulation. Spoke to RN and no signs or  symptoms of bleeding at this time.  Goal of Therapy:  Heparin level 0.3-0.7 units/ml Monitor platelets by anticoagulation protocol: Yes   Plan:  Continue heparin gtt to 900 units/hr Daily heparin level and CBC F/u decision for long-term oral anticoagulation   Thank you for allowing [place] to participate in this patients care.  Jens Som, PharmD Clinical phone for 03/22/2017: [phone] 03/22/2017 11:59 AM

## 2017-03-22 NOTE — Progress Notes (Signed)
PROGRESS NOTE    Tina West  FWY:[id number] DOB: [birth year] DOA: 03/12/2017 PCP: Claretta Fraise, MD    Brief Narrative:  84 year old female with history of atrial fibrillation, diastolic CHF, hypothyroidism and anxiety presented with worsening shortness of breath for 2 weeks.  Patient was admitted for atrial fibrillation with rapid ventricular rate and elevated troponin.  Cardiology was consulted.  Echo showed ejection fraction of 15-20%.  Plan for catheterization today.  Heart rate control has been difficult; responded well to diuresis.  Assessment & Plan:   Principal Problem:   A-fib ([place]) Active Problems:   Essential hypertension   Left leg cellulitis   Hypothyroidism   Demand ischemia (HCC)   Acute on chronic diastolic (congestive) heart failure (HCC)   Thrombocytosis (HCC)   Protein-calorie malnutrition, severe   Acute systolic CHF (congestive heart failure) (HCC)   PAD (peripheral artery disease) (HCC)  Atrial fibrillation with rapid ventricular rate - CHA2DS2-VASc5. -rate control had been noted to be difficult to control.  Patient currently on metoprolol as per cardiology. Patient is continued on heparin drip  - Needs anticoagulation after cath but financial limitations may limit NOAC use and non-compliance makes warfarin suboptimal. Cardiology has been following. IV digoxin was ordered 1/5 per Cardiology -determination on chronic anticoagulation pending. Hgb slightly lower. Cardiology recommendation to continue heparin for now  New cardiomyopathy with LVEF 27-74%, acute systolic CHF (new), acute on chronic diastolic CHF with anasarca and associated demand ischemia  -Improving.  Strict input and output.  Daily weights.  Continue metoprolol.  Cardiology is following and pt is s/p heart cath 03/18/17 with findings of 20% ost LM to mid LM, 50%mid LAD, 50% ost 2nd diagonal, 25% 1st mrg to prox LAD, 80% 1st marg -Recommendation to continue aggressive management for heart  failure as per Cardiology  Possible cellulitis left foot with ulcerations noted present on admission(probably secondary to ruptured bulla but do suspect PAD). No evidence of acute limb ischemia. - appreciate vascular surgery eval - Course of Keflexcompleted. Cellulitis noted to have resolved.  Bilateral moderate peripheral vascular disease with non-compressible tibilal arteries -Vascular surgery medications: Pt is NOT a surgical candidate at this point with acute changes in cardiac function - At this point, manage the feet with wound care:  Continue Santyl as currently ordered  Compression once necrotic skin debrided  Dr. Jess Barters office will contact patient to arrange for outpatient follow-up forsilver impregnated compression stockings(have been in contact with scheduler Rozell Searing)  Hypothyroidism - Patient has been continued on levothyroxine .  Thrombocytosis, chronic -trending down;f/u as an outpatient -Recheck CBC in AM  Aortic atherosclerosis  - continue statin as tolerated  Severe malnutrition -Continue to follow nutrition recommendations -Stable at this time  DVT prophylaxis: Heparin gtt Code Status: Full Family Communication: Pt in room, family at bedside Disposition Plan: SNF, timing uncertain  Consultants:   Cardiology  Procedures:   Echo Study Conclusions  - Left ventricle: The cavity size was mildly dilated. There was moderate concentric hypertrophy. Systolic function was severely reduced. The estimated ejection fraction was in the range of 15% to 20%. Diffuse hypokinesis. The study was not technically sufficient to allow evaluation of LV diastolic dysfunction due to atrial fibrillation. - Ventricular septum: Septal motion showed paradox. - Aortic valve: Valve mobility was restricted. - Aortic root: The aortic root was normal in size. - Mitral valve: Calcified annulus. Mildly thickened leaflets . There was mild  regurgitation. - Left atrium: The atrium was moderately dilated. - Right ventricle: The cavity size  was severely dilated. Wall thickness was normal. Systolic function was moderately reduced. - Right atrium: The atrium was moderately dilated. - Tricuspid valve: There was moderate regurgitation. - Pulmonary arteries: Systolic pressure was moderately increased. PA peak pressure: 54 mm Hg (S). - Inferior vena cava: The vessel was dilated. The respirophasic diameter changes were blunted (<50%), consistent with elevated central venous pressure. - Pericardium, extracardiac: There was no pericardial effusion  -Heart cath 03/18/17  Antimicrobials: Anti-infectives (From admission, onward)   Start     Dose/Rate Route Frequency Ordered Stop   03/15/17 1245  cephALEXin (KEFLEX) capsule 500 mg     500 mg Oral Every 12 hours 03/15/17 1230 03/16/17 2100   03/13/17 1530  ceFAZolin (ANCEF) IVPB 1 g/50 mL premix  Status:  Discontinued     1 g 100 mL/hr over 30 Minutes Intravenous Every 8 hours 03/13/17 1423 03/15/17 1230   03/12/17 1500  ceFAZolin (ANCEF) IVPB 1 g/50 mL premix     1 g 100 mL/hr over 30 Minutes Intravenous  Once 03/12/17 1448 03/12/17 1616      Subjective: No complaints at this time  Objective: Vitals:   03/21/17 1959 03/21/17 2353 03/22/17 0516 03/22/17 1210  BP: 104/62 (!) 95/57 (!) 103/53 (!) 125/55  Pulse: (!) 101 94 (!) 101 96  Resp: 20 18 20 20   Temp: 98.6 F (37 C) 98.8 F (37.1 C) 99 F (37.2 C) 98.6 F (37 C)  TempSrc: Oral Oral Oral Oral  SpO2: 100% 100% 96% 98%  Weight:   72.7 kg (160 lb 4.4 oz)   Height:        Intake/Output Summary (Last 24 hours) at 03/22/2017 1434 Last data filed at 03/22/2017 1350 Gross per 24 hour  Intake 1256.8 ml  Output 2825 ml  Net -1568.2 ml   Filed Weights   03/20/17 0453 03/21/17 0656 03/22/17 0516  Weight: 74.4 kg (164 lb 0.4 oz) 74.8 kg (164 lb 14.5 oz) 72.7 kg (160 lb 4.4 oz)    Examination: General exam:  Conversant, in no acute distress Respiratory system: normal chest rise, clear, no audible wheezing Cardiovascular system: regular rhythm, s1-s2 Gastrointestinal system: Nondistended, nontender, pos BS Central nervous system: No seizures, no tremors Extremities: No cyanosis, no joint deformities Skin: No rashes, no pallor Psychiatry: Affect normal // no auditory hallucinations   Data Reviewed: I have personally reviewed following labs and imaging studies  CBC: Recent Labs  Lab 03/17/17 0416 03/19/17 0745 03/20/17 0709 03/21/17 0908 03/22/17 0456  WBC 13.1* 12.4* 11.1* 14.9* 10.8*  HGB 11.2* 11.0* 11.4* 11.0* 9.1*  HCT 37.0  36.8 36.1 37.2 37.9 31.6*  MCV 83.7 83.8 82.7 83.7 84.7  PLT 586* 612* 592* 609* 627*   Basic Metabolic Panel: Recent Labs  Lab 03/17/17 0416 03/19/17 0745 03/20/17 0709 03/21/17 0908 03/22/17 0456  NA 132* 129* 131* 135 130*  K 4.1 4.4 5.1 5.4* 5.5*  CL 87* 87* 89* 87* 89*  CO2 33* 31 35* 34* 35*  GLUCOSE 138* 146* 133* 212* 103*  BUN 40* 54* 60* 57* 54*  CREATININE 0.68 0.87 0.92 1.02* 0.88  CALCIUM 9.0 8.6* 9.0 8.8* 8.5*  MG 2.1 2.2  --   --   --    GFR: Estimated Creatinine Clearance: 48 mL/min (by C-G formula based on SCr of 0.88 mg/dL). Liver Function Tests: Recent Labs  Lab 03/16/17 0555  AST 52*  ALT 75*  ALKPHOS 78  BILITOT 1.2  PROT 5.7*  ALBUMIN 3.3*   No results  for input(s): LIPASE, AMYLASE in the last 168 hours. No results for input(s): AMMONIA in the last 168 hours. Coagulation Profile: Recent Labs  Lab 03/18/17 0650  INR 1.04   Cardiac Enzymes: No results for input(s): CKTOTAL, CKMB, CKMBINDEX, TROPONINI in the last 168 hours. BNP (last 3 results) No results for input(s): PROBNP in the last 8760 hours. HbA1C: No results for input(s): HGBA1C in the last 72 hours. CBG: Recent Labs  Lab 03/17/17 1828 03/21/17 0343  GLUCAP 130* 185*   Lipid Profile: No results for input(s): CHOL, HDL, LDLCALC, TRIG, CHOLHDL,  LDLDIRECT in the last 72 hours. Thyroid Function Tests: No results for input(s): TSH, T4TOTAL, FREET4, T3FREE, THYROIDAB in the last 72 hours. Anemia Panel: No results for input(s): VITAMINB12, FOLATE, FERRITIN, TIBC, IRON, RETICCTPCT in the last 72 hours. Sepsis Labs: No results for input(s): PROCALCITON, LATICACIDVEN in the last 168 hours.  Recent Results (from the past 240 hour(s))  MRSA PCR Screening     Status: None   Collection Time: 03/13/17  1:59 AM  Result Value Ref Range Status   MRSA by PCR NEGATIVE NEGATIVE Final    Comment:        The GeneXpert MRSA Assay (FDA approved for NASAL specimens only), is one component of a comprehensive MRSA colonization surveillance program. It is not intended to diagnose MRSA infection nor to guide or monitor treatment for MRSA infections.      Radiology Studies: No results found.  Scheduled Meds: . atorvastatin  20 mg Oral q1800  . calcium carbonate  1 tablet Oral TID WC  . collagenase   Topical Daily  . digoxin  0.125 mg Oral Daily  . feeding supplement (ENSURE ENLIVE)  237 mL Oral TID BM  . furosemide  40 mg Oral Daily  . levothyroxine  150 mcg Oral QAC breakfast  . metoprolol tartrate  50 mg Oral BID  . multivitamin with minerals  1 tablet Oral Daily  . sodium chloride flush  3 mL Intravenous Q12H  . sodium chloride flush  3 mL Intravenous Q12H   Continuous Infusions: . sodium chloride    . sodium chloride    . heparin 900 Units/hr (03/21/17 2200)     LOS: 10 days   Marylu Lund, MD [facility] Pager [phone]  If 7PM-7AM, please contact night-coverage [email or web address] Password [facility] 03/22/2017, 2:34 PM

## 2017-03-22 NOTE — Progress Notes (Signed)
Progress Note  Patient Name: Tina West Date of Encounter: 03/22/2017  Primary Cardiologist: No primary care provider on file.   Subjective   No complaints  Inpatient Medications    Scheduled Meds: . atorvastatin  20 mg Oral q1800  . calcium carbonate  1 tablet Oral TID WC  . collagenase   Topical Daily  . feeding supplement (ENSURE ENLIVE)  237 mL Oral TID BM  . furosemide  40 mg Oral Daily  . levothyroxine  150 mcg Oral QAC breakfast  . metoprolol tartrate  50 mg Oral BID  . multivitamin with minerals  1 tablet Oral Daily  . sodium chloride flush  3 mL Intravenous Q12H  . sodium chloride flush  3 mL Intravenous Q12H   Continuous Infusions: . sodium chloride    . sodium chloride    . heparin 900 Units/hr (03/21/17 2200)   PRN Meds: sodium chloride, sodium chloride, acetaminophen **OR** acetaminophen, ALPRAZolam, alum & mag hydroxide-simeth, diphenhydrAMINE, ondansetron **OR** ondansetron (ZOFRAN) IV, sodium chloride flush, sodium chloride flush   Vital Signs    Vitals:   03/21/17 1700 03/21/17 1959 03/21/17 2353 03/22/17 0516  BP:  104/62 (!) 95/57 (!) 103/53  Pulse:  (!) 101 94 (!) 101  Resp:  20 18 20   Temp:  98.6 F (37 C) 98.8 F (37.1 C) 99 F (37.2 C)  TempSrc:  Oral Oral Oral  SpO2: 99% 100% 100% 96%  Weight:    160 lb 4.4 oz (72.7 kg)  Height:        Intake/Output Summary (Last 24 hours) at 03/22/2017 0849 Last data filed at 03/22/2017 0800 Gross per 24 hour  Intake 866.8 ml  Output 1825 ml  Net -958.2 ml   Filed Weights   03/20/17 0453 03/21/17 0656 03/22/17 0516  Weight: 164 lb 0.4 oz (74.4 kg) 164 lb 14.5 oz (74.8 kg) 160 lb 4.4 oz (72.7 kg)    Telemetry    afib rates 80-90s - Personally Reviewed  ECG    n/a  Physical Exam   GEN: No acute distress.   Neck:elevated Cardiac: irreg.  Respiratory: Clear to auscultation bilaterally. GI: Soft, nontender, non-distended  MS: No edema; No deformity. Neuro:  Nonfocal  Psych: Normal  affect   Labs    Chemistry Recent Labs  Lab 03/16/17 0555  03/20/17 0709 03/21/17 0908 03/22/17 0456  NA 134*   < > 131* 135 130*  K 3.8   < > 5.1 5.4* 5.5*  CL 89*   < > 89* 87* 89*  CO2 33*   < > 35* 34* 35*  GLUCOSE 150*   < > 133* 212* 103*  BUN 38*   < > 60* 57* 54*  CREATININE 0.76   < > 0.92 1.02* 0.88  CALCIUM 9.0   < > 9.0 8.8* 8.5*  PROT 5.7*  --   --   --   --   ALBUMIN 3.3*  --   --   --   --   AST 52*  --   --   --   --   ALT 75*  --   --   --   --   ALKPHOS 78  --   --   --   --   BILITOT 1.2  --   --   --   --   GFRNONAA >60   < > 55* 48* 58*  GFRAA >60   < > >60 56* >60  ANIONGAP 12   < >  7 14 6    < > = values in this interval not displayed.     Hematology Recent Labs  Lab 03/20/17 0709 03/21/17 0908 03/22/17 0456  WBC 11.1* 14.9* 10.8*  RBC 4.50 4.53 3.73*  HGB 11.4* 11.0* 9.1*  HCT 37.2 37.9 31.6*  MCV 82.7 83.7 84.7  MCH 25.3* 24.3* 24.4*  MCHC 30.6 29.0* 28.8*  RDW 15.5 15.3 15.1  PLT 592* 609* 495*    Cardiac EnzymesNo results for input(s): TROPONINI in the last 168 hours. No results for input(s): TROPIPOC in the last 168 hours.   BNPNo results for input(s): BNP, PROBNP in the last 168 hours.   DDimer No results for input(s): DDIMER in the last 168 hours.   Radiology    No results found.  Cardiac Studies    Patient Profile     84 y.o.femalewith acute on chronic systolic and diastolic congestive heart failure. Atrial fibrillation    Assessment & Plan    1. Acute on chronic combined systolic/diastolic HF - 22/9798 echo LVEF 15-20%, cannot eval diastolic function due to afib. Moderate RV dysfunction, PASP 54 - Jan 2019 cath: mild to moderate CAD out of proportion to CHF. Mean PA 27, PCWP 29, CI 1.61, LVEDP 20 - negative 777mL yesterday, negative 3.8 liters since admission. She is on oral lasix 40mg  daily, mild uptrend in Cr and BUN that is better today.  - medical therapy with lopressor 50mg  bid. Low bp's at times, have not  started ACE-I yet. Consolidated to long acting Toprol once bp's and HR's settle. I think if rates ok with digoxin would change to long acting tomorrow.   2. Afib - irregular wide complex rhythm due to her LBBB - elevated rates, with her low CI would not be overally aggressive titrating her beta blocker - yesterday we loaded with IV digoxin total of 1mg . Start oral maintence today 0.125mg  daily - CHADS2Vasc score is 73 (age x2, gender, CHF). Currently on heparin drip Patient has no regular transporations, cost is major issue regarding oral therapy. Patient and family also reports several falls, with patient previously striking her head. Make final decision on anticoag at discharge, however at this time likely would not continue as outpatient. Also with drop in Hgb today that needs to me monitored on heparin.    3. Social - difficultly affording meds and making appointments, followed by case management.   4. Anemia - per primary team, follow trends on heparin   For questions or updates, please contact [place] Please consult [email or web address] for contact info under Cardiology/STEMI.      Merrily Pew, MD  03/22/2017, 8:49 AM

## 2017-03-22 NOTE — Plan of Care (Signed)
  Health Behavior/Discharge Planning: Ability to manage health-related needs will improve 03/22/2017 1041 - Not Progressing by Lavonna Rua, RN

## 2017-03-23 LAB — CBC
HCT: 32.4 % — ABNORMAL LOW (ref 36.0–46.0)
HEMOGLOBIN: 9.1 g/dL — AB (ref 12.0–15.0)
MCH: 24.2 pg — AB (ref 26.0–34.0)
MCHC: 28.1 g/dL — ABNORMAL LOW (ref 30.0–36.0)
MCV: 86.2 fL (ref 78.0–100.0)
Platelets: 523 10*3/uL — ABNORMAL HIGH (ref 150–400)
RBC: 3.76 MIL/uL — AB (ref 3.87–5.11)
RDW: 15.3 % (ref 11.5–15.5)
WBC: 11.2 10*3/uL — AB (ref 4.0–10.5)

## 2017-03-23 LAB — BASIC METABOLIC PANEL
ANION GAP: 5 (ref 5–15)
BUN: 44 mg/dL — AB (ref 6–20)
CHLORIDE: 95 mmol/L — AB (ref 101–111)
CO2: 35 mmol/L — ABNORMAL HIGH (ref 22–32)
Calcium: 8.3 mg/dL — ABNORMAL LOW (ref 8.9–10.3)
Creatinine, Ser: 0.81 mg/dL (ref 0.44–1.00)
GFR calc Af Amer: >60 mL/min (ref >60)
Glucose, Bld: 128 mg/dL — ABNORMAL HIGH (ref 65–99)
POTASSIUM: 5.1 mmol/L (ref 3.5–5.1)
Sodium: 135 mmol/L (ref 135–145)

## 2017-03-23 LAB — HEPARIN LEVEL (UNFRACTIONATED): Heparin Unfractionated: 0.27 IU/mL — ABNORMAL LOW (ref 0.30–0.70)

## 2017-03-23 NOTE — Progress Notes (Signed)
[place] for Heparin Indication: atrial fibrillation  Allergies  Allergen Reactions  . Codeine Hives  . Contrast Media [Iodinated Diagnostic Agents] Other (See Comments)    unspecified  . Diazepam Hives  . Meperidine Hcl Other (See Comments)    unspecified  . Morphine Other (See Comments)    "caused heart attack"   Patient Measurements: Height: 5\' 9"  (175.3 cm) Weight: 161 lb 9.6 oz (73.3 kg) IBW/kg (Calculated) : 66.2  Vital Signs: Temp: 98 F (36.7 C) (01/07 0518) Temp Source: Oral (01/07 0518) BP: 152/92 (01/07 0840) Pulse Rate: 109 (01/07 0843)  Labs: Recent Labs    03/21/17 0908 03/21/17 2014 03/22/17 0456 03/23/17 0526  HGB 11.0*  --  9.1* 9.1*  HCT 37.9  --  31.6* 32.4*  PLT 609*  --  495* 523*  HEPARINUNFRC 0.75* 0.43 0.32 0.27*  CREATININE 1.02*  --  0.88 0.81   Estimated Creatinine Clearance: 52.1 mL/min (by C-G formula based on SCr of 0.81 mg/dL).  Medical History: Past Medical History:  Diagnosis Date  . A-fib ([place])   . Abdominal cyst    Multiple surgeries in [place]. "Heart Stopped" on the operating table during one of her "cyst" operations, probably 15-20 years ago  . Anxiety   . Diastolic CHF, acute (HCC)    BNP 179 in 1/11. Echo (1/11) with EF 60%, mild focal basal septal hypertrophy, grade I diastolic dysfunction, mild left atrial enlargement.   Marland Kitchen HTN (hypertension)   . Hypothyroidism   . Obesity   . PAD (peripheral artery disease) ([place]) 03/16/2017  . S/P TAH (total abdominal hysterectomy)    Assessment: Anticoag: Heparin for afib (CHADSVASC= 5) resumed s/p cath 1/2. no PTA AC > possible DOAC upon discharge  Heparin level trending down and subtherapeutic today at 0.27 at drip rate 900units/hr. Hgb trending down 11>9.1, Plt 902>409>735. No bleeding documented; per Cardiology note pt is likely not a candidate for outpatient anticoagulation.   Goal of Therapy:  Heparin level 0.3-0.7 units/ml Monitor  platelets by anticoagulation protocol: Yes   Plan:  Increase heparin gtt to 1050 units/hr Daily heparin level and CBC F/u decision for long-term oral anticoagulation  Thank you for allowing [place] to participate in this patients care.  Mariella Saa, PharmD Clinical Pharmacist  03/23/2017 11:19 AM

## 2017-03-23 NOTE — Progress Notes (Signed)
Progress Note  Patient Name: Tina West Date of Encounter: 03/23/2017  Primary Cardiologist:  Pixie Casino, MD  Subjective   Pt appears chronically ill. Alert but cant tell me where she lives or why she is here, denies chest pain. Says may be breathing a little better than yesterday.   Inpatient Medications    Scheduled Meds: . atorvastatin  20 mg Oral q1800  . calcium carbonate  1 tablet Oral TID WC  . collagenase   Topical Daily  . digoxin  0.125 mg Oral Daily  . feeding supplement (ENSURE ENLIVE)  237 mL Oral TID BM  . furosemide  40 mg Oral Daily  . levothyroxine  150 mcg Oral QAC breakfast  . metoprolol tartrate  50 mg Oral BID  . multivitamin with minerals  1 tablet Oral Daily  . sodium chloride flush  3 mL Intravenous Q12H  . sodium chloride flush  3 mL Intravenous Q12H   Continuous Infusions: . sodium chloride    . sodium chloride    . heparin 1,050 Units/hr (03/23/17 0843)   PRN Meds: sodium chloride, sodium chloride, acetaminophen **OR** acetaminophen, ALPRAZolam, alum & mag hydroxide-simeth, diphenhydrAMINE, ondansetron **OR** ondansetron (ZOFRAN) IV, sodium chloride flush, sodium chloride flush   Vital Signs    Vitals:   03/22/17 2014 03/23/17 0518 03/23/17 0840 03/23/17 0843  BP: (!) 113/47 112/78 (!) 152/92   Pulse: (!) 105 (!) 125 (!) 109 (!) 109  Resp: 20 20    Temp: 98.2 F (36.8 C) 98 F (36.7 C)    TempSrc: Oral Oral    SpO2: 100% 99%    Weight:  161 lb 9.6 oz (73.3 kg)    Height:        Intake/Output Summary (Last 24 hours) at 03/23/2017 1027 Last data filed at 03/23/2017 9326 Gross per 24 hour  Intake 1175 ml  Output 3200 ml  Net -2025 ml   Filed Weights   03/21/17 0656 03/22/17 0516 03/23/17 0518  Weight: 164 lb 14.5 oz (74.8 kg) 160 lb 4.4 oz (72.7 kg) 161 lb 9.6 oz (73.3 kg)    Telemetry    Atrial fibrillation in the 90's-low 100's - Personally Reviewed  ECG    No new tracings - Personally Reviewed  Physical Exam    GEN: chronically ill appearing, pale, elderly, frail female. No acute distress.   Neck: + JVD Cardiac: Irregularly irregular rhythm, no murmurs, rubs, or gallops.  Respiratory: Clear to auscultation bilaterally. GI: Soft, nontender, non-distended  MS: No edema; No deformity. Neuro:  Poor memory Psych: Normal affect   Labs    Chemistry Recent Labs  Lab 03/21/17 0908 03/22/17 0456 03/23/17 0526  NA 135 130* 135  K 5.4* 5.5* 5.1  CL 87* 89* 95*  CO2 34* 35* 35*  GLUCOSE 212* 103* 128*  BUN 57* 54* 44*  CREATININE 1.02* 0.88 0.81  CALCIUM 8.8* 8.5* 8.3*  GFRNONAA 48* 58* >60  GFRAA 56* >60 >60  ANIONGAP 14 6 5      Hematology Recent Labs  Lab 03/21/17 0908 03/22/17 0456 03/23/17 0526  WBC 14.9* 10.8* 11.2*  RBC 4.53 3.73* 3.76*  HGB 11.0* 9.1* 9.1*  HCT 37.9 31.6* 32.4*  MCV 83.7 84.7 86.2  MCH 24.3* 24.4* 24.2*  MCHC 29.0* 28.8* 28.1*  RDW 15.3 15.1 15.3  PLT 609* 495* 523*    Cardiac EnzymesNo results for input(s): TROPONINI in the last 168 hours. No results for input(s): TROPIPOC in the last 168 hours.   BNPNo  results for input(s): BNP, PROBNP in the last 168 hours.   DDimer No results for input(s): DDIMER in the last 168 hours.   Radiology    No results found.  Cardiac Studies   RIGHT/LEFT HEART CATH AND CORONARY ANGIOGRAPHY  03/18/2017  Conclusion    The left ventricular ejection fraction is less than 25% by visual estimate.  There is severe left ventricular systolic dysfunction.  LV end diastolic pressure is moderately elevated, 22 mm Hg.  Ost LM to Mid LM lesion is 20% stenosed.  Mid LAD lesion is 50% stenosed.  Ost 2nd Diag lesion is 50% stenosed.  1st Mrg lesion is 80% stenosed. Heavily calcified. This does not explain her low EF.  Ost LAD to Prox LAD lesion is 25% stenosed.  Hemodynamic findings consistent with mild pulmonary hypertension.  CO 2.97 L/min; CI 1.61, Ao sat 99%, PA sat 45%. Elevated PCWP 27 mm Hg.  Right radial  loop had to be navigated with a prowater wire to allow catheter to advance.   Mild to moderate CAD.  LV dysfunction is well out of proportion to the degree of CAD.    She needs aggressive management for her severe systolic heart failure.     Echocardiogram 03/13/2017 Study Conclusions - Left ventricle: The cavity size was mildly dilated. There was   moderate concentric hypertrophy. Systolic function was severely   reduced. The estimated ejection fraction was in the range of 15%   to 20%. Diffuse hypokinesis. The study was not technically   sufficient to allow evaluation of LV diastolic dysfunction due to   atrial fibrillation. - Ventricular septum: Septal motion showed paradox. - Aortic valve: Valve mobility was restricted. - Aortic root: The aortic root was normal in size. - Mitral valve: Calcified annulus. Mildly thickened leaflets .   There was mild regurgitation. - Left atrium: The atrium was moderately dilated. - Right ventricle: The cavity size was severely dilated. Wall   thickness was normal. Systolic function was moderately reduced. - Right atrium: The atrium was moderately dilated. - Tricuspid valve: There was moderate regurgitation. - Pulmonary arteries: Systolic pressure was moderately increased.   PA peak pressure: 54 mm Hg (S). - Inferior vena cava: The vessel was dilated. The respirophasic   diameter changes were blunted (< 50%), consistent with elevated   central venous pressure. - Pericardium, extracardiac: There was no pericardial effusion.    Patient Profile     84 y.o. female with acute on chronic systolic and diastolic congestive heart failure. Has hx of Atrial fibrillation, dCHF, hypothyroidism, and anxiety presented with 2 week hx of worsening shortness of breath. Echo revealed EF 15-20%. L&R heart cath found mild-mod CAD with LV function well out of proportion for this level of LV dysfunction.   Assessment & Plan    Acute on chronic diastolic CHF:   -LVEF 51-76% on echo and L&R HC showed mild-mod CAD with LV function well out of proportion for this level of LV dysfunction.  -Pt diuresed and now on oral lasix 40 mg daily.  -Wt was down from 164 lbs to around 150 lbs, now back up to 161, although wts have been somehat erratic. 3.2L UOP in last 24 h. Net negative 5.6L fluid balance since admission.  -SCr had elevated mildly to 1.02, now trended back down on oral lasix dosing to 0.81 today. -Medical therapy with lopressor 50 mg bid. Digoxin 0.125 mg daily. Plan to add ARB/Entresto once HR controlled and BP tolerates.   Atrial fibrillation: -Rates  were not well controlled and BP was too low with carvedilol. Switched to metoprolol tartrate. Plan to consolidate to succinate given her LV dysfunction.  -Rates moderately controlled in the 90's-low 100's. BP currently stable. -CHA2DS2/VAS Stroke Risk Score is 5 (CHF, HTN, Age (2), female). Currently on heparin drip for stroke risk reduction. Determination on chronic anticoagulation is pending. There is a question of cost for DOAC and for non-compliance with warfarin. Also the family reports several falls with patient striking her head previously.   Social: -There is documentation of pt having difficulty affording meds and making appointments. Case management is following. There is a plan for SNF placement, not sure if this will be long term or for rehab only.     Anemia: -Hgb down to 9.1 yesterday, stable today. No active bleeding. Management per primary team.     For questions or updates, please contact [place] Please consult [email or web address] for contact info under Cardiology/STEMI.      Signed, Daune Perch, NP  03/23/2017, 10:27 AM    History and all data above reviewed.  Patient examined.  I agree with the findings as above.  She is very confused and cannot tell me why she is here or whether she is having pain or SOB but she does not look to be in any distress  The patient exam reveals  [id number]  ,  Lungs: Decreased breath sounds  ,  Abd: Positive bowel sounds, no rebound no guarding, Ext No edema  .  All available labs, radiology testing, previous records reviewed. Agree with documented assessment and plan. Atrial fib:  We will need to primary team to comment on her bleeding risk (that is, will she be going to skilled nursing where she will be able to receive meds and not be at risk of falls.)  If so she needs anticoagulation if she is also not thought to be having chronic blood loss anemia.  Jeneen Rinks [facility]  11:32 AM  03/23/2017

## 2017-03-23 NOTE — Progress Notes (Signed)
PROGRESS NOTE    Tina West  ZOX:[id number] DOB: 15-Aug-1928 DOA: 03/12/2017 PCP: Claretta Fraise, MD    Brief Narrative:  84 year old female with history of atrial fibrillation, diastolic CHF, hypothyroidism and anxiety presented with worsening shortness of breath for 2 weeks.  Patient was admitted for atrial fibrillation with rapid ventricular rate and elevated troponin.  Cardiology was consulted.  Echo showed ejection fraction of 15-20%.  Plan for catheterization today.  Heart rate control has been difficult; responded well to diuresis.  Assessment & Plan:   Principal Problem:   A-fib ([place]) Active Problems:   Essential hypertension   Left leg cellulitis   Hypothyroidism   Demand ischemia (HCC)   Acute on chronic diastolic (congestive) heart failure (HCC)   Thrombocytosis (HCC)   Protein-calorie malnutrition, severe   Acute systolic CHF (congestive heart failure) (HCC)   PAD (peripheral artery disease) (HCC)  Atrial fibrillation with rapid ventricular rate - CHA2DS2-VASc5. -rate control had been noted to be difficult to control.  Patient currently on metoprolol as per cardiology. Patient is continued on heparin drip  - Needs anticoagulation after cath but financial limitations may limit NOAC use and non-compliance makes warfarin suboptimal. Cardiology continues to follow. Peak HR of 130's overnight.  -Patient noted to have poor standing balance per PT, thus has increased fall risk. However, patient is planned for SNF at time of discharge, thus would have closer supervision  New cardiomyopathy with LVEF 81-19%, acute systolic CHF (new), acute on chronic diastolic CHF with anasarca and associated demand ischemia  -Improving.  Strict input and output.  Daily weights.  Continue metoprolol.  Cardiology is following and pt is s/p heart cath 03/18/17 with findings of 20% ost LM to mid LM, 50%mid LAD, 50% ost 2nd diagonal, 25% 1st mrg to prox LAD, 80% 1st marg -Continued with  diuresis for heart failure as per Cardiology  Possible cellulitis left foot with ulcerations noted present on admission(probably secondary to ruptured bulla but do suspect PAD). No evidence of acute limb ischemia. - appreciate vascular surgery eval - Course of Keflexcompleted. Cellulitis noted to have resolved earlier  Bilateral moderate peripheral vascular disease with non-compressible tibilal arteries -Vascular surgery medications: Pt is NOT a surgical candidate at this point with acute changes in cardiac function - At this point, manage the feet with wound care:  Continue Santyl as currently ordered  Compression once necrotic skin debrided  Dr. Jess Barters office will contact patient to arrange for outpatient follow-up forsilver impregnated compression stockings(have been in contact with scheduler Rozell Searing)  Hypothyroidism - Patient has been continued on levothyroxine .  Thrombocytosis, chronic -trending down;f/u as an outpatient -Repeat CBC in AM  Aortic atherosclerosis  - continue statin as tolerated  Severe malnutrition -Continue to follow nutrition recommendations -Remains stable at this time  DVT prophylaxis: Heparin gtt Code Status: Full Family Communication: Pt in room, family at bedside Disposition Plan: SNF, timing uncertain  Consultants:   Cardiology  Procedures:   Echo Study Conclusions  - Left ventricle: The cavity size was mildly dilated. There was moderate concentric hypertrophy. Systolic function was severely reduced. The estimated ejection fraction was in the range of 15% to 20%. Diffuse hypokinesis. The study was not technically sufficient to allow evaluation of LV diastolic dysfunction due to atrial fibrillation. - Ventricular septum: Septal motion showed paradox. - Aortic valve: Valve mobility was restricted. - Aortic root: The aortic root was normal in size. - Mitral valve: Calcified annulus. Mildly thickened  leaflets . There was mild regurgitation. -  Left atrium: The atrium was moderately dilated. - Right ventricle: The cavity size was severely dilated. Wall thickness was normal. Systolic function was moderately reduced. - Right atrium: The atrium was moderately dilated. - Tricuspid valve: There was moderate regurgitation. - Pulmonary arteries: Systolic pressure was moderately increased. PA peak pressure: 54 mm Hg (S). - Inferior vena cava: The vessel was dilated. The respirophasic diameter changes were blunted (<50%), consistent with elevated central venous pressure. - Pericardium, extracardiac: There was no pericardial effusion  -Heart cath 03/18/17  Antimicrobials: Anti-infectives (From admission, onward)   Start     Dose/Rate Route Frequency Ordered Stop   03/15/17 1245  cephALEXin (KEFLEX) capsule 500 mg     500 mg Oral Every 12 hours 03/15/17 1230 03/16/17 2100   03/13/17 1530  ceFAZolin (ANCEF) IVPB 1 g/50 mL premix  Status:  Discontinued     1 g 100 mL/hr over 30 Minutes Intravenous Every 8 hours 03/13/17 1423 03/15/17 1230   03/12/17 1500  ceFAZolin (ANCEF) IVPB 1 g/50 mL premix     1 g 100 mL/hr over 30 Minutes Intravenous  Once 03/12/17 1448 03/12/17 1616      Subjective: Without complaints  Objective: Vitals:   03/23/17 0518 03/23/17 0840 03/23/17 0843 03/23/17 1153  BP: 112/78 (!) 152/92  (!) 126/55  Pulse: (!) 125 (!) 109 (!) 109 90  Resp: 20   20  Temp: 98 F (36.7 C)   97.6 F (36.4 C)  TempSrc: Oral   Oral  SpO2: 99%   96%  Weight: 73.3 kg (161 lb 9.6 oz)     Height:        Intake/Output Summary (Last 24 hours) at 03/23/2017 1533 Last data filed at 03/23/2017 1215 Gross per 24 hour  Intake 1025 ml  Output 3775 ml  Net -2750 ml   Filed Weights   03/21/17 0656 03/22/17 0516 03/23/17 0518  Weight: 74.8 kg (164 lb 14.5 oz) 72.7 kg (160 lb 4.4 oz) 73.3 kg (161 lb 9.6 oz)    Examination: General exam: Awake, laying in bed, in nad Respiratory  system: Normal respiratory effort, no wheezing Cardiovascular system: regular rate, s1, s2 Gastrointestinal system: Soft, nondistended, positive BS Central nervous system: CN2-12 grossly intact, strength intact Extremities: Perfused, no clubbing Skin: Normal skin turgor, no notable skin lesions seen Psychiatry: Mood normal // no visual hallucinations   Data Reviewed: I have personally reviewed following labs and imaging studies  CBC: Recent Labs  Lab 03/19/17 0745 03/20/17 0709 03/21/17 0908 03/22/17 0456 03/23/17 0526  WBC 12.4* 11.1* 14.9* 10.8* 11.2*  HGB 11.0* 11.4* 11.0* 9.1* 9.1*  HCT 36.1 37.2 37.9 31.6* 32.4*  MCV 83.8 82.7 83.7 84.7 86.2  PLT 612* 592* 609* 495* 588*   Basic Metabolic Panel: Recent Labs  Lab 03/17/17 0416 03/19/17 0745 03/20/17 0709 03/21/17 0908 03/22/17 0456 03/23/17 0526  NA 132* 129* 131* 135 130* 135  K 4.1 4.4 5.1 5.4* 5.5* 5.1  CL 87* 87* 89* 87* 89* 95*  CO2 33* 31 35* 34* 35* 35*  GLUCOSE 138* 146* 133* 212* 103* 128*  BUN 40* 54* 60* 57* 54* 44*  CREATININE 0.68 0.87 0.92 1.02* 0.88 0.81  CALCIUM 9.0 8.6* 9.0 8.8* 8.5* 8.3*  MG 2.1 2.2  --   --   --   --    GFR: Estimated Creatinine Clearance: 52.1 mL/min (by C-G formula based on SCr of 0.81 mg/dL). Liver Function Tests: No results for input(s): AST, ALT, ALKPHOS, BILITOT, PROT, ALBUMIN  in the last 168 hours. No results for input(s): LIPASE, AMYLASE in the last 168 hours. No results for input(s): AMMONIA in the last 168 hours. Coagulation Profile: Recent Labs  Lab 03/18/17 0650  INR 1.04   Cardiac Enzymes: No results for input(s): CKTOTAL, CKMB, CKMBINDEX, TROPONINI in the last 168 hours. BNP (last 3 results) No results for input(s): PROBNP in the last 8760 hours. HbA1C: No results for input(s): HGBA1C in the last 72 hours. CBG: Recent Labs  Lab 03/17/17 1828 03/21/17 0343  GLUCAP 130* 185*   Lipid Profile: No results for input(s): CHOL, HDL, LDLCALC, TRIG,  CHOLHDL, LDLDIRECT in the last 72 hours. Thyroid Function Tests: No results for input(s): TSH, T4TOTAL, FREET4, T3FREE, THYROIDAB in the last 72 hours. Anemia Panel: No results for input(s): VITAMINB12, FOLATE, FERRITIN, TIBC, IRON, RETICCTPCT in the last 72 hours. Sepsis Labs: No results for input(s): PROCALCITON, LATICACIDVEN in the last 168 hours.  No results found for this or any previous visit (from the past 240 hour(s)).   Radiology Studies: No results found.  Scheduled Meds: . atorvastatin  20 mg Oral q1800  . calcium carbonate  1 tablet Oral TID WC  . collagenase   Topical Daily  . digoxin  0.125 mg Oral Daily  . feeding supplement (ENSURE ENLIVE)  237 mL Oral TID BM  . furosemide  40 mg Oral Daily  . levothyroxine  150 mcg Oral QAC breakfast  . metoprolol tartrate  50 mg Oral BID  . multivitamin with minerals  1 tablet Oral Daily  . sodium chloride flush  3 mL Intravenous Q12H  . sodium chloride flush  3 mL Intravenous Q12H   Continuous Infusions: . sodium chloride    . sodium chloride    . heparin 1,050 Units/hr (03/23/17 0843)     LOS: 11 days   Marylu Lund, MD [facility] Pager [phone]  If 7PM-7AM, please contact night-coverage [email or web address] Password [facility] 03/23/2017, 3:33 PM

## 2017-03-23 NOTE — Progress Notes (Signed)
Occupational Therapy Treatment Patient Details Name: Tina West MRN: [id number] DOB: [birth year] Today's Date: 03/23/2017    History of present illness 84yo presented with LE edema, orthopnea, DOE. Admitted for (new) afib/RVR, volume overload, elevated troponin. HR control has been difficult; responded well to diuresis; echo showed new cardiomyopathy LVEF 15-20%. S/p RIGHT/LEFT HEART CATH AND CORONARY ANGIOGRAPHY 03/18/17.   OT comments  Pt very pleasant, A & O x 2 (person and place). Pt stood at [place] only seconds before begining to urinate and have a BM. Nurse tech called in to assist OT with pericare and hygiene at bed level. OT will continue to follow acutely  Follow Up Recommendations  SNF;Supervision/Assistance - 24 hour    Equipment Recommendations  Other (comment)(TBD at next venue of care)    Recommendations for Other Services      Precautions / Restrictions Precautions Precautions: Fall Restrictions Weight Bearing Restrictions: No       Mobility Bed Mobility Overal bed mobility: Needs Assistance Bed Mobility: Supine to Sit;Sit to Supine;Rolling Rolling: Mod assist   Supine to sit: Mod assist Sit to supine: Mod assist   General bed mobility comments: Mod assist for safety and line management   Transfers Overall transfer level: Needs assistance Equipment used: Rolling walker (2 wheeled) Transfers: Sit to/from Stand Sit to Stand: Max assist         General transfer comment: multimodal cues for correct hand placement. Pt stoopd up for ~ 5 seconds before starting to urnate an dhave BM. Pt returned to supine for hygiene    Balance Overall balance assessment: Needs assistance Sitting-balance support: Bilateral upper extremity supported;Feet supported Sitting balance-Leahy Scale: Fair     Standing balance support: Bilateral upper extremity supported;During functional activity Standing balance-Leahy Scale: Poor Standing balance comment: reliant on B UE support                             ADL either performed or assessed with clinical judgement   ADL Overall ADL's : Needs assistance/impaired                                             Vision Patient Visual Report: No change from baseline     Perception     Praxis      Cognition Arousal/Alertness: Awake/alert Behavior During Therapy: Restless;Impulsive Overall Cognitive Status: Impaired/Different from baseline Area of Impairment: Memory;Following commands;Safety/judgement;Awareness;Problem solving;Orientation                 Orientation Level: Person;Place   Memory: Decreased short-term memory Following Commands: Follows one step commands consistently;Follows multi-step commands inconsistently Safety/Judgement: Decreased awareness of safety;Decreased awareness of deficits Awareness: Emergent Problem Solving: Slow processing;Decreased initiation General Comments: pt unable to tell OT and nurse tech where she lived prior to hospital admission, conitued stating "a little bit of everywhere, here and there after I sold my house in [place]"        Exercises     Shoulder Instructions       General Comments      Pertinent Vitals/ Pain       Pain Assessment: Faces Faces Pain Scale: Hurts little more Pain Location: back, buttocks during rolling/bed mobility Pain Descriptors / Indicators: Aching;Sore Pain Intervention(s): Limited activity within patient's tolerance;Monitored during session;Repositioned  Home Living  Prior Functioning/Environment              Frequency  Min 2X/week        Progress Toward Goals  OT Goals(current goals can now be found in the care plan section)  Progress towards OT goals: OT to reassess next treatment  Acute Rehab OT Goals Patient Stated Goal: go home  Plan Discharge plan remains appropriate    Co-evaluation                  AM-PAC PT "6 Clicks" Daily Activity     Outcome Measure   Help from another person eating meals?: A Little Help from another person taking care of personal grooming?: A Little Help from another person toileting, which includes using toliet, bedpan, or urinal?: Total Help from another person bathing (including washing, rinsing, drying)?: A Lot Help from another person to put on and taking off regular upper body clothing?: A Lot Help from another person to put on and taking off regular lower body clothing?: A Lot 6 Click Score: 13    End of Session Equipment Utilized During Treatment: Gait belt;Other (comment)(RW)  OT Visit Diagnosis: Muscle weakness (generalized) (M62.81);Other abnormalities of gait and mobility (R26.89);Other symptoms and signs involving cognitive function   Activity Tolerance Patient tolerated treatment well   Patient Left in bed;with call bell/phone within reach;with nursing/sitter in room   Nurse Communication      Functional Assessment Tool Used: AM-PAC 6 Clicks Daily Activity   Time: 6659-9357 OT Time Calculation (min): 29 min  Charges: OT G-codes **NOT FOR INPATIENT CLASS** Functional Assessment Tool Used: AM-PAC 6 Clicks Daily Activity OT General Charges $OT Visit: 1 Visit OT Treatments $Self Care/Home Management : 8-22 mins $Therapeutic Activity: 8-22 mins     Britt Bottom 03/23/2017, 2:29 PM

## 2017-03-23 NOTE — Telephone Encounter (Signed)
Still in hospital, likely to discharge later this week to SNF. Will follow up later this week to schedule appointment with MD.

## 2017-03-23 NOTE — Clinical Social Work Note (Signed)
Per MD, patient will likely discharge Wednesday or Thursday. SNF notified. CSW will have to start new authorization once updated PT/OT notes are in.  [place]ton Scrape, [place]

## 2017-03-23 NOTE — Progress Notes (Signed)
Physical Therapy Treatment Patient Details Name: Tina West MRN: [id number] DOB: [birth year] Today's Date: 03/23/2017    History of Present Illness 84yo presented with LE edema, orthopnea, DOE. Admitted for (new) afib/RVR, volume overload, elevated troponin. HR control has been difficult; responded well to diuresis; echo showed new cardiomyopathy LVEF 15-20%. S/p RIGHT/LEFT HEART CATH AND CORONARY ANGIOGRAPHY 03/18/17.    PT Comments    Patient received in bed, pleasantly confused and willing to work with skilled PT services. Patient A&O x1 only, requiring re-orientation as appropriate throughout session. Noted patient had had BM, assisted with rolling patient for CNA to perform cleaning and skin care, then proceeded with transfer to edge of bed. Able to reach standing position and attempted stand-pivot to recliner however patient then actively began to have another bowel movement, requiring return to bed for care from CNA. Patient left in bed with CNA attending, all needs otherwise met.     Follow Up Recommendations  SNF;Supervision/Assistance - 24 hour     Equipment Recommendations  Rolling walker with 5" wheels    Recommendations for Other Services OT consult     Precautions / Restrictions Precautions Precautions: Fall Restrictions Weight Bearing Restrictions: No    Mobility  Bed Mobility Overal bed mobility: Needs Assistance Bed Mobility: Supine to Sit;Sit to Supine;Rolling Rolling: Mod assist   Supine to sit: Mod assist Sit to supine: Mod assist   General bed mobility comments: Mod assist for safety and line management   Transfers Overall transfer level: Needs assistance Equipment used: Rolling walker (2 wheeled) Transfers: Sit to/from Stand Sit to Stand: Max assist         General transfer comment: Max VC for hand placement, Max assist to come to full standing; attempted stand-pivot transfer to chair however patient actively began having BM in middle of  trasnfer requring return to bed for care by CNA   Ambulation/Gait             General Gait Details: DNT due to need to return to bed for care following BM    Stairs            Wheelchair Mobility    Modified Rankin (Stroke Patients Only)       Balance Overall balance assessment: Needs assistance Sitting-balance support: Bilateral upper extremity supported;Feet supported Sitting balance-Leahy Scale: Fair     Standing balance support: Bilateral upper extremity supported;During functional activity Standing balance-Leahy Scale: Poor Standing balance comment: reliant on B UE support                             Cognition Arousal/Alertness: Awake/alert Behavior During Therapy: Restless;Impulsive Overall Cognitive Status: Impaired/Different from baseline Area of Impairment: Memory;Following commands;Safety/judgement;Awareness;Problem solving;Orientation                 Orientation Level: Person   Memory: Decreased short-term memory Following Commands: Follows one step commands consistently;Follows multi-step commands inconsistently Safety/Judgement: Decreased awareness of safety;Decreased awareness of deficits Awareness: Emergent Problem Solving: Slow processing;Decreased initiation General Comments: patient A&Ox1 this morning to self only, perseverating on topics such her home address and oatmeal       Exercises      General Comments        Pertinent Vitals/Pain Pain Assessment: Faces Faces Pain Scale: Hurts a little bit Pain Location: back, buttocks  Pain Descriptors / Indicators: Aching;Sore Pain Intervention(s): Limited activity within patient's tolerance;Monitored during session    Home Living  Prior Function            PT Goals (current goals can now be found in the care plan section) Acute Rehab PT Goals Patient Stated Goal: go home PT Goal Formulation: With patient Time For Goal Achievement:  03/31/17 Potential to Achieve Goals: Fair Progress towards PT goals: Not progressing toward goals - comment    Frequency    Min 2X/week      PT Plan Current plan remains appropriate    Co-evaluation              AM-PAC PT "6 Clicks" Daily Activity  Outcome Measure  Difficulty turning over in bed (including adjusting bedclothes, sheets and blankets)?: Unable Difficulty moving from lying on back to sitting on the side of the bed? : Unable Difficulty sitting down on and standing up from a chair with arms (e.g., wheelchair, bedside commode, etc,.)?: Unable Help needed moving to and from a bed to chair (including a wheelchair)?: A Lot Help needed walking in hospital room?: A Lot Help needed climbing 3-5 steps with a railing? : Total 6 Click Score: 8    End of Session Equipment Utilized During Treatment: Gait belt Activity Tolerance: Patient limited by fatigue Patient left: in bed;with nursing/sitter in room   PT Visit Diagnosis: Unsteadiness on feet (R26.81);Muscle weakness (generalized) (M62.81);Difficulty in walking, not elsewhere classified (R26.2);Pain Pain - part of body: (back )     Time: 0910-0950 PT Time Calculation (min) (ACUTE ONLY): 40 min  Charges:  $Therapeutic Activity: 38-52 mins                    G Codes:       Deniece Ree PT, DPT, CBIS  Supplemental Physical Therapist [place]   Pager [phone]

## 2017-03-24 LAB — BASIC METABOLIC PANEL
ANION GAP: 7 (ref 5–15)
BUN: 34 mg/dL — ABNORMAL HIGH (ref 6–20)
CHLORIDE: 95 mmol/L — AB (ref 101–111)
CO2: 34 mmol/L — AB (ref 22–32)
CREATININE: 0.73 mg/dL (ref 0.44–1.00)
Calcium: 8.4 mg/dL — ABNORMAL LOW (ref 8.9–10.3)
GFR calc non Af Amer: >60 mL/min (ref >60)
Glucose, Bld: 106 mg/dL — ABNORMAL HIGH (ref 65–99)
POTASSIUM: 4.8 mmol/L (ref 3.5–5.1)
Sodium: 136 mmol/L (ref 135–145)

## 2017-03-24 LAB — CBC
HEMATOCRIT: 33.8 % — AB (ref 36.0–46.0)
HEMOGLOBIN: 9.6 g/dL — AB (ref 12.0–15.0)
MCH: 24.2 pg — ABNORMAL LOW (ref 26.0–34.0)
MCHC: 28.4 g/dL — AB (ref 30.0–36.0)
MCV: 85.1 fL (ref 78.0–100.0)
Platelets: 540 10*3/uL — ABNORMAL HIGH (ref 150–400)
RBC: 3.97 MIL/uL (ref 3.87–5.11)
RDW: 15.4 % (ref 11.5–15.5)
WBC: 10.5 10*3/uL (ref 4.0–10.5)

## 2017-03-24 LAB — HEPARIN LEVEL (UNFRACTIONATED): HEPARIN UNFRACTIONATED: 0.48 [IU]/mL (ref 0.30–0.70)

## 2017-03-24 MED ORDER — LOSARTAN POTASSIUM 25 MG PO TABS
12.5000 mg | ORAL_TABLET | Freq: Every day | ORAL | Status: DC
  Administered 2017-03-24 – 2017-03-27 (×4): 12.5 mg via ORAL
  Filled 2017-03-24 (×4): qty 1

## 2017-03-24 MED ORDER — ENSURE ENLIVE PO LIQD
237.0000 mL | Freq: Two times a day (BID) | ORAL | Status: DC
  Administered 2017-03-25 – 2017-03-26 (×3): 237 mL via ORAL

## 2017-03-24 NOTE — Progress Notes (Signed)
Progress Note  Patient Name: Tina West Date of Encounter: 03/24/2017  Primary Cardiologist: Pixie Casino, MD   Subjective   Breathing improving. No chest pain.   Inpatient Medications    Scheduled Meds: . atorvastatin  20 mg Oral q1800  . calcium carbonate  1 tablet Oral TID WC  . collagenase   Topical Daily  . digoxin  0.125 mg Oral Daily  . feeding supplement (ENSURE ENLIVE)  237 mL Oral TID BM  . furosemide  40 mg Oral Daily  . levothyroxine  150 mcg Oral QAC breakfast  . metoprolol tartrate  50 mg Oral BID  . multivitamin with minerals  1 tablet Oral Daily  . sodium chloride flush  3 mL Intravenous Q12H  . sodium chloride flush  3 mL Intravenous Q12H   Continuous Infusions: . sodium chloride    . sodium chloride    . heparin 1,050 Units/hr (03/24/17 0445)   PRN Meds: sodium chloride, sodium chloride, acetaminophen **OR** acetaminophen, ALPRAZolam, alum & mag hydroxide-simeth, diphenhydrAMINE, ondansetron **OR** ondansetron (ZOFRAN) IV, sodium chloride flush, sodium chloride flush   Vital Signs    Vitals:   03/23/17 2019 03/24/17 0554 03/24/17 0852 03/24/17 0853  BP: (!) 126/52 129/82  (!) 114/57  Pulse: 86 96 (!) 127   Resp: 18 18  18   Temp: (!) 97.5 F (36.4 C) 97.7 F (36.5 C)  98.1 F (36.7 C)  TempSrc: Oral Oral  Oral  SpO2: 95% 100%  98%  Weight:  159 lb 2.8 oz (72.2 kg)    Height:        Intake/Output Summary (Last 24 hours) at 03/24/2017 0959 Last data filed at 03/24/2017 0847 Gross per 24 hour  Intake 976.5 ml  Output 2625 ml  Net -1648.5 ml   Filed Weights   03/22/17 0516 03/23/17 0518 03/24/17 0554  Weight: 160 lb 4.4 oz (72.7 kg) 161 lb 9.6 oz (73.3 kg) 159 lb 2.8 oz (72.2 kg)    Telemetry    afib at rate of 90-100s - Personally Reviewed  ECG    Non today  - Personally Reviewed  Physical Exam   GEN: Frail chronically ill appearing female in no acute distress.   Neck: + JVD Cardiac: Irregularly irregular, no murmurs,  rubs, or gallops.  Respiratory:Bibasilar rales  GI: Soft, nontender, non-distended  MS: No edema; No deformity. Neuro:  Nonfocal  Psych: Normal affect   Labs    Chemistry Recent Labs  Lab 03/22/17 0456 03/23/17 0526 03/24/17 0637  NA 130* 135 136  K 5.5* 5.1 4.8  CL 89* 95* 95*  CO2 35* 35* 34*  GLUCOSE 103* 128* 106*  BUN 54* 44* 34*  CREATININE 0.88 0.81 0.73  CALCIUM 8.5* 8.3* 8.4*  GFRNONAA 58* >60 >60  GFRAA >60 >60 >60  ANIONGAP 6 5 7      Hematology Recent Labs  Lab 03/22/17 0456 03/23/17 0526 03/24/17 0637  WBC 10.8* 11.2* 10.5  RBC 3.73* 3.76* 3.97  HGB 9.1* 9.1* 9.6*  HCT 31.6* 32.4* 33.8*  MCV 84.7 86.2 85.1  MCH 24.4* 24.2* 24.2*  MCHC 28.8* 28.1* 28.4*  RDW 15.1 15.3 15.4  PLT 495* 523* 540*    Radiology    No results found.  Cardiac Studies   RIGHT/LEFT HEART CATH AND CORONARY ANGIOGRAPHY  03/18/2017  Conclusion    The left ventricular ejection fraction is less than 25% by visual estimate.  There is severe left ventricular systolic dysfunction.  LV end diastolic pressure is moderately  elevated, 22 mm Hg.  Ost LM to Mid LM lesion is 20% stenosed.  Mid LAD lesion is 50% stenosed.  Ost 2nd Diag lesion is 50% stenosed.  1st Mrg lesion is 80% stenosed. Heavily calcified. This does not explain her low EF.  Ost LAD to Prox LAD lesion is 25% stenosed.  Hemodynamic findings consistent with mild pulmonary hypertension.  CO 2.97 L/min; CI 1.61, Ao sat 99%, PA sat 45%. Elevated PCWP 27 mm Hg.  Right radial loop had to be navigated with a prowater wire to allow catheter to advance.  Mild to moderate CAD. LV dysfunction is well out of proportion to the degree of CAD.   She needs aggressive management for her severe systolic heart failure.    Echocardiogram 03/13/2017 Study Conclusions - Left ventricle: The cavity size was mildly dilated. There was moderate concentric hypertrophy. Systolic function was severely reduced. The  estimated ejection fraction was in the range of 15% to 20%. Diffuse hypokinesis. The study was not technically sufficient to allow evaluation of LV diastolic dysfunction due to atrial fibrillation. - Ventricular septum: Septal motion showed paradox. - Aortic valve: Valve mobility was restricted. - Aortic root: The aortic root was normal in size. - Mitral valve: Calcified annulus. Mildly thickened leaflets . There was mild regurgitation. - Left atrium: The atrium was moderately dilated. - Right ventricle: The cavity size was severely dilated. Wall thickness was normal. Systolic function was moderately reduced. - Right atrium: The atrium was moderately dilated. - Tricuspid valve: There was moderate regurgitation. - Pulmonary arteries: Systolic pressure was moderately increased. PA peak pressure: 54 mm Hg (S). - Inferior vena cava: The vessel was dilated. The respirophasic diameter changes were blunted (<50%), consistent with elevated central venous pressure. - Pericardium, extracardiac: There was no pericardial effusion  Patient Profile     84 y.o. female with acute on chronic systolic and diastolic congestive heart failure. Has hx of Atrial fibrillation, dCHF, hypothyroidism, and anxiety presented with 2 week hx of worsening shortness of breath. Echo revealed EF 15-20%. L&R heart cath found mild-mod CAD with LV function well out of proportion for this level of LV dysfunction.   Assessment & Plan    1. Acute on chronic combined CHF - LVEF 15-20% on echo and L&R HC showed mild-mod CAD with LV function well out of proportion for this level of LV dysfunction.  - Net I & O negative 7.1L. Fluctuating weight. Breathing improving. Scr normal.  - Continue current dose of lasix. Continue BB and digoxin. Consider adding ARB (seem she can not afford Entresto.  BP is relatively stable.   2. Persistent atrial fibrillation - Rate in 90-100s on lopressor 50mg .  Plan to consolidate  to succinate given her LV dysfunction.  - CHA2DS2/VAS Stroke Risk Score is 5 (CHF, HTN, Age (2), female). On IV heparin for anticoagulation. Multiple socioeconomic factors and fall risk needs to be consider for long term anticoagulation. Plan to discharge to SNF. Unsure long vs short term.   3. Anemia - Hgb stable  4. CAD - Cath as above. Medical management. Continue statin. Last dose of ASA 03/18/17..   For questions or updates, please contact [place] Please consult [email or web address] for contact info under Cardiology/STEMI.      SignedLeanor Kail, PA  03/24/2017, 9:59 AM    History and all data above reviewed.  Patient examined.  I agree with the findings as above. No chest pain.  She is confused.  Breathing OK.   The patient exam  reveals [id number]  ,  Lungs: Decreased breath sounds without crackles  ,  Abd: Positive bowel sounds, no rebound no guarding, Ext trace edema  .  All available labs, radiology testing, previous records reviewed. Agree with documented assessment and plan. Cardiomyopathy:  I am going to start a low dose of ARB.    Jeneen Rinks Oddie Kuhlmann  11:22 AM  03/24/2017

## 2017-03-24 NOTE — Progress Notes (Signed)
[place] for Heparin Indication: atrial fibrillation  Allergies  Allergen Reactions  . Codeine Hives  . Contrast Media [Iodinated Diagnostic Agents] Other (See Comments)    unspecified  . Diazepam Hives  . Meperidine Hcl Other (See Comments)    unspecified  . Morphine Other (See Comments)    "caused heart attack"   Patient Measurements: Height: 5\' 9"  (175.3 cm) Weight: 159 lb 2.8 oz (72.2 kg) IBW/kg (Calculated) : 66.2  Vital Signs: Temp: 98.1 F (36.7 C) (01/08 0853) Temp Source: Oral (01/08 0853) BP: 114/57 (01/08 0853) Pulse Rate: 127 (01/08 0852)  Labs: Recent Labs    03/22/17 0456 03/23/17 0526 03/24/17 0637  HGB 9.1* 9.1* 9.6*  HCT 31.6* 32.4* 33.8*  PLT 495* 523* 540*  HEPARINUNFRC 0.32 0.27* 0.48  CREATININE 0.88 0.81 0.73   Estimated Creatinine Clearance: 52.8 mL/min (by C-G formula based on SCr of 0.73 mg/dL).  Medical History: Past Medical History:  Diagnosis Date  . A-fib ([place])   . Abdominal cyst    Multiple surgeries in [place]. "Heart Stopped" on the operating table during one of her "cyst" operations, probably 15-20 years ago  . Anxiety   . Diastolic CHF, acute (HCC)    BNP 179 in 1/11. Echo (1/11) with EF 60%, mild focal basal septal hypertrophy, grade I diastolic dysfunction, mild left atrial enlargement.   Marland Kitchen HTN (hypertension)   . Hypothyroidism   . Obesity   . PAD (peripheral artery disease) ([place]) 03/16/2017  . S/P TAH (total abdominal hysterectomy)    Assessment: Anticoag: Heparin for afib (CHADSVASC= 5) resumed s/p cath 1/2. no PTA AC > possible DOAC upon discharge  Heparin level now therapeutic at 0.48 after increase of 1050 units/hr from 900units/hr . Hgb 9.6 and Plt 540, both  trending up. No bleeding documented; per Cardiology note pt is likely not a candidate for outpatient anticoagulation.   Goal of Therapy:  Heparin level 0.3-0.7 units/ml Monitor platelets by anticoagulation protocol:  Yes   Plan:  Continue heparin gtt to 1050 units/hr Daily heparin level and CBC F/u decision for long-term oral anticoagulation  Thank you for allowing [place] to participate in this patients care.  Mariella Saa, PharmD Clinical Pharmacist  03/24/2017 9:02 AM

## 2017-03-24 NOTE — Progress Notes (Signed)
PROGRESS NOTE    Tina West  UTM:[id number] DOB: 05-02-1928 DOA: 03/12/2017 PCP: Claretta Fraise, MD    Brief Narrative:  84 year old female with history of atrial fibrillation, diastolic CHF, hypothyroidism and anxiety presented with worsening shortness of breath for 2 weeks.  Patient was admitted for atrial fibrillation with rapid ventricular rate and elevated troponin.  Cardiology was consulted.  Echo showed ejection fraction of 15-20%.  Plan for catheterization today.  Heart rate control has been difficult; responded well to diuresis.  Assessment & Plan:   Principal Problem:   A-fib ([place]) Active Problems:   Essential hypertension   Left leg cellulitis   Hypothyroidism   Demand ischemia (HCC)   Acute on chronic diastolic (congestive) heart failure (HCC)   Thrombocytosis (HCC)   Protein-calorie malnutrition, severe   Acute systolic CHF (congestive heart failure) (HCC)   PAD (peripheral artery disease) (HCC)  Atrial fibrillation with rapid ventricular rate - CHA2DS2-VASc5. -rate control had been noted to be difficult to control.  Patient currently on metoprolol as per cardiology. Patient is continued on heparin drip  - Needs anticoagulation after cath but financial limitations may limit NOAC use and non-compliance makes warfarin suboptimal. Cardiology continues to follow. Peak HR of 130's overnight.  -Patient noted to have poor standing balance per PT with history of multiple falls prior to admission and questionable insight to care. Patient is considered high risk for anticoagulation as a result  New cardiomyopathy with LVEF 56-81%, acute systolic CHF (new), acute on chronic diastolic CHF with anasarca and associated demand ischemia  -Improving.  Strict input and output.  Daily weights.  Continue metoprolol.  Cardiology is following and pt is s/p heart cath 03/18/17 with findings of 20% ost LM to mid LM, 50%mid LAD, 50% ost 2nd diagonal, 25% 1st mrg to prox LAD, 80% 1st  marg -Continue as per heart failure as per Cardiology  Possible cellulitis left foot with ulcerations noted present on admission(probably secondary to ruptured bulla but do suspect PAD). No evidence of acute limb ischemia. - appreciate vascular surgery eval - Course of Keflexcompleted. Cellulitis noted to have resolved  Bilateral moderate peripheral vascular disease with non-compressible tibilal arteries -Vascular surgery medications: Pt is NOT a surgical candidate at this point with acute changes in cardiac function - At this point, manage the feet with wound care:  Continue Santyl as currently ordered  Compression once necrotic skin debrided  Dr. Jess Barters office will contact patient to arrange for outpatient follow-up forsilver impregnated compression stockings(have been in contact with scheduler [place])  Hypothyroidism - Continue on levothyroxine .  Thrombocytosis, chronic -trending down;f/u as an outpatient -Recheck CBC in AM  Aortic atherosclerosis  -continue statin as tolerated  Severe malnutrition -Continue to follow nutrition recommendations -Remains stable at this time  DVT prophylaxis: Heparin gtt Code Status: Full Family Communication: Pt in room, family at bedside Disposition Plan: SNF, timing uncertain  Consultants:   Cardiology  Procedures:   Echo Study Conclusions  - Left ventricle: The cavity size was mildly dilated. There was moderate concentric hypertrophy. Systolic function was severely reduced. The estimated ejection fraction was in the range of 15% to 20%. Diffuse hypokinesis. The study was not technically sufficient to allow evaluation of LV diastolic dysfunction due to atrial fibrillation. - Ventricular septum: Septal motion showed paradox. - Aortic valve: Valve mobility was restricted. - Aortic root: The aortic root was normal in size. - Mitral valve: Calcified annulus. Mildly thickened leaflets . There  was mild regurgitation. - Left atrium: The  atrium was moderately dilated. - Right ventricle: The cavity size was severely dilated. Wall thickness was normal. Systolic function was moderately reduced. - Right atrium: The atrium was moderately dilated. - Tricuspid valve: There was moderate regurgitation. - Pulmonary arteries: Systolic pressure was moderately increased. PA peak pressure: 54 mm Hg (S). - Inferior vena cava: The vessel was dilated. The respirophasic diameter changes were blunted (<50%), consistent with elevated central venous pressure. - Pericardium, extracardiac: There was no pericardial effusion  -Heart cath 03/18/17  Antimicrobials: Anti-infectives (From admission, onward)   Start     Dose/Rate Route Frequency Ordered Stop   03/15/17 1245  cephALEXin (KEFLEX) capsule 500 mg     500 mg Oral Every 12 hours 03/15/17 1230 03/16/17 2100   03/13/17 1530  ceFAZolin (ANCEF) IVPB 1 g/50 mL premix  Status:  Discontinued     1 g 100 mL/hr over 30 Minutes Intravenous Every 8 hours 03/13/17 1423 03/15/17 1230   03/12/17 1500  ceFAZolin (ANCEF) IVPB 1 g/50 mL premix     1 g 100 mL/hr over 30 Minutes Intravenous  Once 03/12/17 1448 03/12/17 1616      Subjective: No complaints at present  Objective: Vitals:   03/24/17 0554 03/24/17 0852 03/24/17 0853 03/24/17 1338  BP: 129/82  (!) 114/57 (!) 116/53  Pulse: 96 (!) 127  85  Resp: 18  18 18   Temp: 97.7 F (36.5 C)  98.1 F (36.7 C) 98 F (36.7 C)  TempSrc: Oral  Oral Oral  SpO2: 100%  98% 99%  Weight: 72.2 kg (159 lb 2.8 oz)     Height:        Intake/Output Summary (Last 24 hours) at 03/24/2017 1520 Last data filed at 03/24/2017 0847 Gross per 24 hour  Intake 736.5 ml  Output 1050 ml  Net -313.5 ml   Filed Weights   03/22/17 0516 03/23/17 0518 03/24/17 0554  Weight: 72.7 kg (160 lb 4.4 oz) 73.3 kg (161 lb 9.6 oz) 72.2 kg (159 lb 2.8 oz)    Examination: General exam: Conversant, in no acute  distress Respiratory system: normal chest rise, clear, no audible wheezing Cardiovascular system: regular rhythm, s1-s2 Gastrointestinal system: Nondistended, nontender, pos BS Central nervous system: No seizures, no tremors Extremities: No cyanosis, no joint deformities Skin: No rashes, no pallor Psychiatry: Affect normal // no auditory hallucinations   Data Reviewed: I have personally reviewed following labs and imaging studies  CBC: Recent Labs  Lab 03/20/17 0709 03/21/17 0908 03/22/17 0456 03/23/17 0526 03/24/17 0637  WBC 11.1* 14.9* 10.8* 11.2* 10.5  HGB 11.4* 11.0* 9.1* 9.1* 9.6*  HCT 37.2 37.9 31.6* 32.4* 33.8*  MCV 82.7 83.7 84.7 86.2 85.1  PLT 592* 609* 495* 523* 564*   Basic Metabolic Panel: Recent Labs  Lab 03/19/17 0745 03/20/17 0709 03/21/17 0908 03/22/17 0456 03/23/17 0526 03/24/17 0637  NA 129* 131* 135 130* 135 136  K 4.4 5.1 5.4* 5.5* 5.1 4.8  CL 87* 89* 87* 89* 95* 95*  CO2 31 35* 34* 35* 35* 34*  GLUCOSE 146* 133* 212* 103* 128* 106*  BUN 54* 60* 57* 54* 44* 34*  CREATININE 0.87 0.92 1.02* 0.88 0.81 0.73  CALCIUM 8.6* 9.0 8.8* 8.5* 8.3* 8.4*  MG 2.2  --   --   --   --   --    GFR: Estimated Creatinine Clearance: 52.8 mL/min (by C-G formula based on SCr of 0.73 mg/dL). Liver Function Tests: No results for input(s): AST, ALT, ALKPHOS, BILITOT, PROT, ALBUMIN in  the last 168 hours. No results for input(s): LIPASE, AMYLASE in the last 168 hours. No results for input(s): AMMONIA in the last 168 hours. Coagulation Profile: Recent Labs  Lab 03/18/17 0650  INR 1.04   Cardiac Enzymes: No results for input(s): CKTOTAL, CKMB, CKMBINDEX, TROPONINI in the last 168 hours. BNP (last 3 results) No results for input(s): PROBNP in the last 8760 hours. HbA1C: No results for input(s): HGBA1C in the last 72 hours. CBG: Recent Labs  Lab 03/17/17 1828 03/21/17 0343  GLUCAP 130* 185*   Lipid Profile: No results for input(s): CHOL, HDL, LDLCALC, TRIG,  CHOLHDL, LDLDIRECT in the last 72 hours. Thyroid Function Tests: No results for input(s): TSH, T4TOTAL, FREET4, T3FREE, THYROIDAB in the last 72 hours. Anemia Panel: No results for input(s): VITAMINB12, FOLATE, FERRITIN, TIBC, IRON, RETICCTPCT in the last 72 hours. Sepsis Labs: No results for input(s): PROCALCITON, LATICACIDVEN in the last 168 hours.  No results found for this or any previous visit (from the past 240 hour(s)).   Radiology Studies: No results found.  Scheduled Meds: . atorvastatin  20 mg Oral q1800  . calcium carbonate  1 tablet Oral TID WC  . collagenase   Topical Daily  . digoxin  0.125 mg Oral Daily  . [START ON 03/25/2017] feeding supplement (ENSURE ENLIVE)  237 mL Oral BID BM  . furosemide  40 mg Oral Daily  . levothyroxine  150 mcg Oral QAC breakfast  . losartan  12.5 mg Oral Daily  . metoprolol tartrate  50 mg Oral BID  . multivitamin with minerals  1 tablet Oral Daily  . sodium chloride flush  3 mL Intravenous Q12H  . sodium chloride flush  3 mL Intravenous Q12H   Continuous Infusions: . sodium chloride    . sodium chloride    . heparin 1,050 Units/hr (03/24/17 0445)     LOS: 12 days   Marylu Lund, MD [facility] Pager [phone]  If 7PM-7AM, please contact night-coverage [email or web address] Password [email or web address] 03/24/2017, 3:20 PM

## 2017-03-24 NOTE — Progress Notes (Signed)
Nutrition Follow-up  DOCUMENTATION CODES:   Severe malnutrition in context of social or environmental circumstances  INTERVENTION:   -Decrease Ensure Enlive po to BID, each supplement provides 350 kcal and 20 grams of protein -Continue MVI daily  NUTRITION DIAGNOSIS:   Severe Malnutrition related to social / environmental circumstances as evidenced by moderate fat depletion, severe fat depletion, moderate muscle depletion, severe muscle depletion, energy intake < 75% for > or equal to 1 month.  Ongoing  GOAL:   Patient will meet greater than or equal to 90% of their needs  Progressing  MONITOR:   PO intake, Supplement acceptance, Labs, Weight trends, Skin, I & O's  REASON FOR ASSESSMENT:   Consult Wound healing  ASSESSMENT:   Tina West is a 84 y.o. female with medical history of A-fib, dCHF, hypothyroidism and anxiety who comes in for symptoms that have been going on for about 2 wks (started Dec 14th).   12/28- CWOCN c/s revealed 3 areas of full thickness wounds on bilateral feet 12/29- transferred from SDU to floor  Pt consuming lunch at time of visit. No family members at bedside. Noted meal completion has improved; PO: 50-100%. Pt also sipping on Ensure. Pt enjoys supplements and has been compliant with taking them.   Per CSW notes, plan to d/c to SNF once medically stable.   Diet Order:  Diet 2 gram sodium Room service appropriate? Yes; Fluid consistency: Thin  EDUCATION NEEDS:   Education needs have been addressed  Skin:  Skin Assessment: Skin Integrity Issues: Skin Integrity Issues:: Other (Comment) Other: bilateral leg cellulitis, full thickness wounds to bilateral feet  Last BM:  03/24/17  Height:   Ht Readings from Last 1 Encounters:  03/12/17 5\' 9"  (1.753 m)    Weight:   Wt Readings from Last 1 Encounters:  03/24/17 159 lb 2.8 oz (72.2 kg)    Ideal Body Weight:  65.9 kg  BMI:  Body mass index is 23.51 kg/m.  Estimated Nutritional  Needs:   Kcal:  1650-1850  Protein:  80-95 grams  Fluid:  > 1.6 L    Tina West A. Jimmye Norman, RD, LDN, CDE Pager: [phone] After hours Pager: [phone]

## 2017-03-24 NOTE — Clinical Social Work Note (Signed)
[place] is cancelling authorization to SNF since patient is not stable for discharge yet. They asked that [facility] fax updated clinicals once she is ready.  Dayton Scrape, [place]

## 2017-03-25 DIAGNOSIS — L03116 Cellulitis of left lower limb: Secondary | ICD-10-CM

## 2017-03-25 DIAGNOSIS — E039 Hypothyroidism, unspecified: Secondary | ICD-10-CM

## 2017-03-25 DIAGNOSIS — D473 Essential (hemorrhagic) thrombocythemia: Secondary | ICD-10-CM

## 2017-03-25 LAB — CBC
HEMATOCRIT: 39.2 % (ref 36.0–46.0)
Hemoglobin: 11.2 g/dL — ABNORMAL LOW (ref 12.0–15.0)
MCH: 24.3 pg — ABNORMAL LOW (ref 26.0–34.0)
MCHC: 28.6 g/dL — AB (ref 30.0–36.0)
MCV: 85.2 fL (ref 78.0–100.0)
PLATELETS: 569 10*3/uL — AB (ref 150–400)
RBC: 4.6 MIL/uL (ref 3.87–5.11)
RDW: 15.4 % (ref 11.5–15.5)
WBC: 12.1 10*3/uL — ABNORMAL HIGH (ref 4.0–10.5)

## 2017-03-25 LAB — COMPREHENSIVE METABOLIC PANEL
ALBUMIN: 3 g/dL — AB (ref 3.5–5.0)
ALT: 78 U/L — ABNORMAL HIGH (ref 14–54)
AST: 43 U/L — AB (ref 15–41)
Alkaline Phosphatase: 71 U/L (ref 38–126)
Anion gap: 10 (ref 5–15)
BUN: 28 mg/dL — AB (ref 6–20)
CHLORIDE: 97 mmol/L — AB (ref 101–111)
CO2: 29 mmol/L (ref 22–32)
Calcium: 8.5 mg/dL — ABNORMAL LOW (ref 8.9–10.3)
Creatinine, Ser: 0.6 mg/dL (ref 0.44–1.00)
GFR calc Af Amer: >60 mL/min (ref >60)
GFR calc non Af Amer: >60 mL/min (ref >60)
GLUCOSE: 106 mg/dL — AB (ref 65–99)
POTASSIUM: 4.2 mmol/L (ref 3.5–5.1)
Sodium: 136 mmol/L (ref 135–145)
Total Bilirubin: 1.5 mg/dL — ABNORMAL HIGH (ref 0.3–1.2)
Total Protein: 5.6 g/dL — ABNORMAL LOW (ref 6.5–8.1)

## 2017-03-25 LAB — HEPARIN LEVEL (UNFRACTIONATED): HEPARIN UNFRACTIONATED: 0.66 [IU]/mL (ref 0.30–0.70)

## 2017-03-25 MED ORDER — CALCIUM CARBONATE ANTACID 500 MG PO CHEW
1.0000 | CHEWABLE_TABLET | Freq: Three times a day (TID) | ORAL | Status: DC
  Administered 2017-03-26 – 2017-03-27 (×4): 200 mg via ORAL
  Filled 2017-03-25 (×5): qty 1

## 2017-03-25 NOTE — Progress Notes (Signed)
PROGRESS NOTE  Tina West VQM:[id number] DOB: August 16, 1928 DOA: 03/12/2017 PCP: Claretta Fraise, MD  HPI/Recap of past 36 hours: 84 year old female with history of atrial fibrillation, diastolic CHF, hypothyroidism and anxiety presented with worsening shortness of breath for 2 weeks.Patient was admitted for atrial fibrillation with rapid ventricular rate and elevated troponin. Admitted for further management. Cardiology was consulted. Echo showed ejection fraction of 15-20%. Heart rate control has been difficult; responded well to diuresis.  Today, pt reported feeling better, denies any new complaints. Denies chest pain or worsening SOB.  Assessment/Plan: Principal Problem:   A-fib ([place]) Active Problems:   Essential hypertension   Left leg cellulitis   Hypothyroidism   Demand ischemia (HCC)   Acute on chronic diastolic (congestive) heart failure (HCC)   Thrombocytosis (HCC)   Protein-calorie malnutrition, severe   Acute systolic CHF (congestive heart failure) (HCC)   PAD (peripheral artery disease) (HCC)  Atrial fibrillation with rapid ventricular rate Currently rate controlled -CHA2DS2-VASc5. -Continue lopressor and digoxin as per cardiology. Patient is continued on heparin drip  - Needs anticoagulation after cath but financial limitations may limit NOAC use and non-compliance makes warfarin suboptimal. Cardiology continues to follow -Patient noted to have poor standing balance per PT with history of multiple falls prior to admission and questionable insight to care  New cardiomyopathywithLVEF 93-26%, acute systolic CHF (new), acute on chronic diastolic CHF with anasarca and associated demand ischemia  Improving Strict input and output. Daily weights. Continue metoprolol. Cardiology is following and pt is s/p heart cath 03/18/17 with findings of 20% ost LM to mid LM, 50%mid LAD, 50% ost 2nd diagonal, 25% 1st mrg to prox LAD, 80% 1st marg -Continue on lasix, BB,  digoxin, ARB. Cardiology on board  Possible cellulitis left foot with ulcerations noted present on admission - appreciatevascular surgery eval - Course of Keflexcompleted. Cellulitis noted to have resolved  Bilateral moderate peripheral vascular disease with non-compressible tibilal arteries -Vascular surgery recs: Pt is NOT a surgical candidate at this point with acute changes in cardiac function - At this point, manage the feet with wound care:  Continue Santyl as currently ordered  Compression once necrotic skin debrided  Dr. Jess Barters office will contact patient to arrange for outpatient follow-up forsilver impregnated compression stockings(have been in contact with scheduler [facility])  Hypothyroidism - Continue on levothyroxine  Thrombocytosis, chronic -trending down;f/u as an outpatient, ??chronic infection -Daily CBC  Aortic atherosclerosis  -continue statin as tolerated  Severe malnutrition -Continue to follow nutrition recommendations -Remains stable at this time     Code Status: Full  Family Communication: None at bedside  Disposition Plan: SNF placement, once stable   Consultants:  Cardiology   Procedures: Cardiac cath on 03/18/17  Antimicrobials:  Completed Keflex  DVT prophylaxis:  Heparin ggt   Objective: Vitals:   03/24/17 1338 03/24/17 2028 03/25/17 0700 03/25/17 1148  BP: (!) 116/53 (!) 113/40 (!) 104/49 118/65  Pulse: 85 91 87 73  Resp: 18 19  18   Temp: 98 F (36.7 C) 97.6 F (36.4 C) 98.1 F (36.7 C) (!) 97.5 F (36.4 C)  TempSrc: Oral Oral Oral Oral  SpO2: 99% 100% 97% 100%  Weight:   72.6 kg (160 lb 0.9 oz)   Height:        Intake/Output Summary (Last 24 hours) at 03/25/2017 1758 Last data filed at 03/25/2017 1512 Gross per 24 hour  Intake 424 ml  Output 700 ml  Net -276 ml   Filed Weights   03/23/17 0518 03/24/17  9735 03/25/17 0700  Weight: 73.3 kg (161 lb 9.6 oz) 72.2 kg (159 lb 2.8 oz) 72.6 kg (160 lb  0.9 oz)    Exam:   General:  Alert, awake, frail, NAD  Cardiovascular: irregular irregular, no added hrt sound  Respiratory: Clear to auscultation bilaterally  Abdomen: Soft, non-tender, non-distended, BS present  Musculoskeletal: No bilateral edema, left foot dressing c/d/i  Skin: Normal  Psychiatry: Normal affect   Data Reviewed: CBC: Recent Labs  Lab 03/21/17 0908 03/22/17 0456 03/23/17 0526 03/24/17 0637 03/25/17 0810  WBC 14.9* 10.8* 11.2* 10.5 12.1*  HGB 11.0* 9.1* 9.1* 9.6* 11.2*  HCT 37.9 31.6* 32.4* 33.8* 39.2  MCV 83.7 84.7 86.2 85.1 85.2  PLT 609* 495* 523* 540* 329*   Basic Metabolic Panel: Recent Labs  Lab 03/19/17 0745  03/21/17 0908 03/22/17 0456 03/23/17 0526 03/24/17 0637 03/25/17 0810  NA 129*   < > 135 130* 135 136 136  K 4.4   < > 5.4* 5.5* 5.1 4.8 4.2  CL 87*   < > 87* 89* 95* 95* 97*  CO2 31   < > 34* 35* 35* 34* 29  GLUCOSE 146*   < > 212* 103* 128* 106* 106*  BUN 54*   < > 57* 54* 44* 34* 28*  CREATININE 0.87   < > 1.02* 0.88 0.81 0.73 0.60  CALCIUM 8.6*   < > 8.8* 8.5* 8.3* 8.4* 8.5*  MG 2.2  --   --   --   --   --   --    < > = values in this interval not displayed.   GFR: Estimated Creatinine Clearance: 52.8 mL/min (by C-G formula based on SCr of 0.6 mg/dL). Liver Function Tests: Recent Labs  Lab 03/25/17 0810  AST 43*  ALT 78*  ALKPHOS 71  BILITOT 1.5*  PROT 5.6*  ALBUMIN 3.0*   No results for input(s): LIPASE, AMYLASE in the last 168 hours. No results for input(s): AMMONIA in the last 168 hours. Coagulation Profile: No results for input(s): INR, PROTIME in the last 168 hours. Cardiac Enzymes: No results for input(s): CKTOTAL, CKMB, CKMBINDEX, TROPONINI in the last 168 hours. BNP (last 3 results) No results for input(s): PROBNP in the last 8760 hours. HbA1C: No results for input(s): HGBA1C in the last 72 hours. CBG: Recent Labs  Lab 03/21/17 0343  GLUCAP 185*   Lipid Profile: No results for input(s):  CHOL, HDL, LDLCALC, TRIG, CHOLHDL, LDLDIRECT in the last 72 hours. Thyroid Function Tests: No results for input(s): TSH, T4TOTAL, FREET4, T3FREE, THYROIDAB in the last 72 hours. Anemia Panel: No results for input(s): VITAMINB12, FOLATE, FERRITIN, TIBC, IRON, RETICCTPCT in the last 72 hours. Urine analysis:    Component Value Date/Time   COLORURINE AMBER (A) 03/12/2017 1338   APPEARANCEUR CLEAR 03/12/2017 1338   LABSPEC 1.026 03/12/2017 1338   PHURINE 5.0 03/12/2017 1338   GLUCOSEU 50 (A) 03/12/2017 1338   HGBUR NEGATIVE 03/12/2017 1338   BILIRUBINUR NEGATIVE 03/12/2017 1338   BILIRUBINUR negative 04/18/2015 1114   KETONESUR NEGATIVE 03/12/2017 1338   PROTEINUR 100 (A) 03/12/2017 1338   UROBILINOGEN negative 04/18/2015 1114   NITRITE NEGATIVE 03/12/2017 1338   LEUKOCYTESUR NEGATIVE 03/12/2017 1338   Sepsis Labs: @LABRCNTIP (procalcitonin:4,lacticidven:4)  )No results found for this or any previous visit (from the past 240 hour(s)).    Studies: No results found.  Scheduled Meds: . atorvastatin  20 mg Oral q1800  . calcium carbonate  1 tablet Oral TID WC  . collagenase  Topical Daily  . digoxin  0.125 mg Oral Daily  . feeding supplement (ENSURE ENLIVE)  237 mL Oral BID BM  . furosemide  40 mg Oral Daily  . levothyroxine  150 mcg Oral QAC breakfast  . losartan  12.5 mg Oral Daily  . metoprolol tartrate  50 mg Oral BID  . multivitamin with minerals  1 tablet Oral Daily  . sodium chloride flush  3 mL Intravenous Q12H  . sodium chloride flush  3 mL Intravenous Q12H    Continuous Infusions: . sodium chloride    . sodium chloride    . heparin 1,050 Units/hr (03/25/17 0556)     LOS: 13 days     Alma Friendly, MD [facility]    If 7PM-7AM, please contact night-coverage [email or web address] Password [facility] 03/25/2017, 5:58 PM

## 2017-03-25 NOTE — Progress Notes (Signed)
Progress Note  Patient Name: Tina West Date of Encounter: 03/25/2017  Primary Cardiologist: Pixie Casino, MD   Subjective   Denies chest discomfort or dyspnea. Got choked while eating this morning.   Inpatient Medications    Scheduled Meds: . atorvastatin  20 mg Oral q1800  . calcium carbonate  1 tablet Oral TID WC  . collagenase   Topical Daily  . digoxin  0.125 mg Oral Daily  . feeding supplement (ENSURE ENLIVE)  237 mL Oral BID BM  . furosemide  40 mg Oral Daily  . levothyroxine  150 mcg Oral QAC breakfast  . losartan  12.5 mg Oral Daily  . metoprolol tartrate  50 mg Oral BID  . multivitamin with minerals  1 tablet Oral Daily  . sodium chloride flush  3 mL Intravenous Q12H  . sodium chloride flush  3 mL Intravenous Q12H   Continuous Infusions: . sodium chloride    . sodium chloride    . heparin 1,050 Units/hr (03/25/17 0556)   PRN Meds: sodium chloride, sodium chloride, acetaminophen **OR** acetaminophen, ALPRAZolam, alum & mag hydroxide-simeth, diphenhydrAMINE, ondansetron **OR** ondansetron (ZOFRAN) IV, sodium chloride flush, sodium chloride flush   Vital Signs    Vitals:   03/24/17 0853 03/24/17 1338 03/24/17 2028 03/25/17 0700  BP: (!) 114/57 (!) 116/53 (!) 113/40 (!) 104/49  Pulse:  85 91 87  Resp: 18 18 19    Temp: 98.1 F (36.7 C) 98 F (36.7 C) 97.6 F (36.4 C) 98.1 F (36.7 C)  TempSrc: Oral Oral Oral Oral  SpO2: 98% 99% 100% 97%  Weight:    160 lb 0.9 oz (72.6 kg)  Height:        Intake/Output Summary (Last 24 hours) at 03/25/2017 0941 Last data filed at 03/25/2017 0857 Gross per 24 hour  Intake 700 ml  Output 1150 ml  Net -450 ml   Filed Weights   03/23/17 0518 03/24/17 0554 03/25/17 0700  Weight: 161 lb 9.6 oz (73.3 kg) 159 lb 2.8 oz (72.2 kg) 160 lb 0.9 oz (72.6 kg)    Telemetry    Atrial fibrillation 80's-100 - Personally Reviewed  ECG    No new tracings- Personally Reviewed  Physical Exam   GEN: Frail elderly female.  No acute distress.   Neck: No JVD Cardiac: irreguarly irregular rhythm, no murmurs, rubs, or gallops.  Respiratory: Clear to auscultation bilaterally. GI: Soft, nontender, non-distended  MS: No edema; No deformity. Neuro:  Nonfocal  Psych: Normal affect   Labs    Chemistry Recent Labs  Lab 03/23/17 0526 03/24/17 0637 03/25/17 0810  NA 135 136 136  K 5.1 4.8 4.2  CL 95* 95* 97*  CO2 35* 34* 29  GLUCOSE 128* 106* 106*  BUN 44* 34* 28*  CREATININE 0.81 0.73 0.60  CALCIUM 8.3* 8.4* 8.5*  PROT  --   --  5.6*  ALBUMIN  --   --  3.0*  AST  --   --  43*  ALT  --   --  78*  ALKPHOS  --   --  71  BILITOT  --   --  1.5*  GFRNONAA >60 >60 >60  GFRAA >60 >60 >60  ANIONGAP 5 7 10      Hematology Recent Labs  Lab 03/23/17 0526 03/24/17 0637 03/25/17 0810  WBC 11.2* 10.5 12.1*  RBC 3.76* 3.97 4.60  HGB 9.1* 9.6* 11.2*  HCT 32.4* 33.8* 39.2  MCV 86.2 85.1 85.2  MCH 24.2* 24.2* 24.3*  MCHC  28.1* 28.4* 28.6*  RDW 15.3 15.4 15.4  PLT 523* 540* 569*    Cardiac EnzymesNo results for input(s): TROPONINI in the last 168 hours. No results for input(s): TROPIPOC in the last 168 hours.   BNPNo results for input(s): BNP, PROBNP in the last 168 hours.   DDimer No results for input(s): DDIMER in the last 168 hours.   Radiology    No results found.  Cardiac Studies   RIGHT/LEFT HEART CATH AND CORONARY ANGIOGRAPHY1/04/2017  Conclusion    The left ventricular ejection fraction is less than 25% by visual estimate.  There is severe left ventricular systolic dysfunction.  LV end diastolic pressure is moderately elevated, 22 mm Hg.  Ost LM to Mid LM lesion is 20% stenosed.  Mid LAD lesion is 50% stenosed.  Ost 2nd Diag lesion is 50% stenosed.  1st Mrg lesion is 80% stenosed. Heavily calcified. This does not explain her low EF.  Ost LAD to Prox LAD lesion is 25% stenosed.  Hemodynamic findings consistent with mild pulmonary hypertension.  CO 2.97 L/min; CI 1.61, Ao  sat 99%, PA sat 45%. Elevated PCWP 27 mm Hg.  Right radial loop had to be navigated with a prowater wire to allow catheter to advance.  Mild to moderate CAD. LV dysfunction is well out of proportion to the degree of CAD.   She needs aggressive management for her severe systolic heart failure.   Echocardiogram 03/13/2017 Study Conclusions - Left ventricle: The cavity size was mildly dilated. There was moderate concentric hypertrophy. Systolic function was severely reduced. The estimated ejection fraction was in the range of 15% to 20%. Diffuse hypokinesis. The study was not technically sufficient to allow evaluation of LV diastolic dysfunction due to atrial fibrillation. - Ventricular septum: Septal motion showed paradox. - Aortic valve: Valve mobility was restricted. - Aortic root: The aortic root was normal in size. - Mitral valve: Calcified annulus. Mildly thickened leaflets . There was mild regurgitation. - Left atrium: The atrium was moderately dilated. - Right ventricle: The cavity size was severely dilated. Wall thickness was normal. Systolic function was moderately reduced. - Right atrium: The atrium was moderately dilated. - Tricuspid valve: There was moderate regurgitation. - Pulmonary arteries: Systolic pressure was moderately increased. PA peak pressure: 54 mm Hg (S). - Inferior vena cava: The vessel was dilated. The respirophasic diameter changes were blunted (<50%), consistent with elevated central venous pressure. - Pericardium, extracardiac: There was no pericardial effusion   Patient Profile     84 y.o. female with acute on chronic systolic and diastolic congestive heart failure. Has hx ofAtrial fibrillation, dCHF, hypothyroidism, and anxiety presented with 2 week hx of worsening shortness of breath. Echo revealed EF 15-20%. L&R heart cath found mild-mod CAD with LV function well out of proportion for this level of LV  dysfunction.  Assessment & Plan    1. Acute on chronic combined CHF -LVEF 15-20% on echo and L&R HC showed mild-mod CAD with LV function well out of proportion for this level of CAD. -Diuresing now with lasix 40 mg po daily. Wt is up 1 lb from yest, relatively stable. 650 ml UOP. Net negative 7.1L since admission.  -Continue on current lasix, BB and dig. ARB has been added. BP is stable. SCR stable. K+ normal.  2. Persistent atrial fibrillation -Rates controlled on lopressor 50 mg BID and digoxin 0.125 mg. Rates 80's-100. - CHA2DS2/VAS Stroke RiskScore is 5 (CHF, HTN, Age (2), female). On IV heparin for anticoagulation. Multiple socioeconomic factors and  fall risk needs to be consider for long term anticoagulation. Plan to discharge to SNF. Unsure long vs short term.   3. Anemia -Hgb up today to 11.2. May be hemoconcentrated, but BUN and Cr are normal, even improved.  4. CAD -Cath as above. Medical management. Continue statin. Currently not on aspirin.   For questions or updates, please contact [place] Please consult [email or web address] for contact info under Cardiology/STEMI.      Signed, Daune Perch, NP  03/25/2017, 9:41 AM    History and all data above reviewed.  Patient examined.  I agree with the findings as above. She is very confused.  She does not seem to have any increased SOB or pain.   The patient exam reveals [id number]  ,  Lungs: Clear  ,  Abd: Positive bowel sounds, no rebound no guarding, Ext No edema  .  All available labs, radiology testing, previous records reviewed. Agree with documented assessment and plan. Acute SOB.  Continue current diuresis.  No change in therapy. Atrial fib:  Start DOAC if she is going to go somewhere where she can be given her meds routinely and is low fall risk.  Please call with further questions.  We will sign off.    Jeneen Rinks Treniyah Lynn  5:54 PM  03/25/2017

## 2017-03-25 NOTE — Progress Notes (Signed)
[place] for Heparin Indication: atrial fibrillation  Allergies  Allergen Reactions  . Codeine Hives  . Contrast Media [Iodinated Diagnostic Agents] Other (See Comments)    unspecified  . Diazepam Hives  . Meperidine Hcl Other (See Comments)    unspecified  . Morphine Other (See Comments)    "caused heart attack"   Patient Measurements: Height: 5\' 9"  (175.3 cm) Weight: 160 lb 0.9 oz (72.6 kg)(bed scale) IBW/kg (Calculated) : 66.2  Vital Signs: Temp: 98.1 F (36.7 C) (01/09 0700) Temp Source: Oral (01/09 0700) BP: 104/49 (01/09 0700) Pulse Rate: 87 (01/09 0700)  Labs: Recent Labs    03/23/17 0526 03/24/17 0637 03/25/17 0810  HGB 9.1* 9.6* 11.2*  HCT 32.4* 33.8* 39.2  PLT 523* 540* 569*  HEPARINUNFRC 0.27* 0.48 0.66  CREATININE 0.81 0.73 0.60   Estimated Creatinine Clearance: 52.8 mL/min (by C-G formula based on SCr of 0.6 mg/dL).  Medical History: Past Medical History:  Diagnosis Date  . A-fib ([place])   . Abdominal cyst    Multiple surgeries in [place]. "Heart Stopped" on the operating table during one of her "cyst" operations, probably 15-20 years ago  . Anxiety   . Diastolic CHF, acute (HCC)    BNP 179 in 1/11. Echo (1/11) with EF 60%, mild focal basal septal hypertrophy, grade I diastolic dysfunction, mild left atrial enlargement.   Marland Kitchen HTN (hypertension)   . Hypothyroidism   . Obesity   . PAD (peripheral artery disease) ([place]) 03/16/2017  . S/P TAH (total abdominal hysterectomy)    Assessment: Anticoag: Heparin for afib (CHADSVASC= 5) resumed s/p cath 1/2. no PTA AC > possible DOAC upon discharge  Heparin level therapeutic at 0.66 on 1050 units/hr .CBC stable. No bleeding documented; discussion ongoing over oral anticoagulation.   Goal of Therapy:  Heparin level 0.3-0.7 units/ml Monitor platelets by anticoagulation protocol: Yes   Plan:  Continue heparin gtt to 1050 units/hr Daily heparin level and CBC F/u  decision for long-term oral anticoagulation  Thank you for allowing [place] to participate in this patients care.  Erin Hearing PharmD., BCPS Clinical Pharmacist Pager [phone] 03/25/2017 9:51 AM

## 2017-03-26 LAB — CBC WITH DIFFERENTIAL/PLATELET
Basophils Absolute: 0 10*3/uL (ref 0.0–0.1)
Basophils Relative: 1 %
Eosinophils Absolute: 0.1 10*3/uL (ref 0.0–0.7)
Eosinophils Relative: 1 %
HCT: 34.6 % — ABNORMAL LOW (ref 36.0–46.0)
Hemoglobin: 10 g/dL — ABNORMAL LOW (ref 12.0–15.0)
Lymphocytes Relative: 14 %
Lymphs Abs: 1.2 10*3/uL (ref 0.7–4.0)
MCH: 24.4 pg — ABNORMAL LOW (ref 26.0–34.0)
MCHC: 28.9 g/dL — ABNORMAL LOW (ref 30.0–36.0)
MCV: 84.4 fL (ref 78.0–100.0)
Monocytes Absolute: 0.6 10*3/uL (ref 0.1–1.0)
Monocytes Relative: 7 %
Neutro Abs: 6.3 10*3/uL (ref 1.7–7.7)
Neutrophils Relative %: 77 %
Platelets: 557 10*3/uL — ABNORMAL HIGH (ref 150–400)
RBC: 4.1 MIL/uL (ref 3.87–5.11)
RDW: 15.3 % (ref 11.5–15.5)
WBC: 8.2 10*3/uL (ref 4.0–10.5)

## 2017-03-26 LAB — BASIC METABOLIC PANEL
ANION GAP: 8 (ref 5–15)
BUN: 30 mg/dL — ABNORMAL HIGH (ref 6–20)
CALCIUM: 8.2 mg/dL — AB (ref 8.9–10.3)
CO2: 31 mmol/L (ref 22–32)
CREATININE: 0.66 mg/dL (ref 0.44–1.00)
Chloride: 99 mmol/L — ABNORMAL LOW (ref 101–111)
Glucose, Bld: 99 mg/dL (ref 65–99)
Potassium: 4.7 mmol/L (ref 3.5–5.1)
SODIUM: 138 mmol/L (ref 135–145)

## 2017-03-26 LAB — HEPARIN LEVEL (UNFRACTIONATED): Heparin Unfractionated: 0.37 [IU]/mL (ref 0.30–0.70)

## 2017-03-26 MED ORDER — NYSTATIN 100000 UNIT/GM EX POWD
Freq: Three times a day (TID) | CUTANEOUS | Status: DC
  Administered 2017-03-26 (×2): via TOPICAL
  Filled 2017-03-26: qty 15

## 2017-03-26 MED ORDER — APIXABAN 5 MG PO TABS
5.0000 mg | ORAL_TABLET | Freq: Two times a day (BID) | ORAL | Status: DC
  Administered 2017-03-26 – 2017-03-27 (×3): 5 mg via ORAL
  Filled 2017-03-26 (×3): qty 1

## 2017-03-26 NOTE — NC FL2 (Signed)
[place] LEVEL OF CARE SCREENING TOOL     IDENTIFICATION  Patient Name: Tina West Birthdate: 1928-09-29 Sex: female Admission Date (Current Location): 03/12/2017  [facility] and [place] Number:  Herbalist and Address:  [facility] [place], [place] [address], [place], [place] [address]      Provider Number: [id number]  Attending Physician Name and Address:  Alma Friendly, MD  Relative Name and Phone Number:       Current Level of Care: Hospital Recommended Level of Care: [place] Prior Approval Number:    Date Approved/Denied:   PASRR Number: [id number]  Discharge Plan: SNF    Current Diagnoses: Patient Active Problem List   Diagnosis Date Noted  . PAD (peripheral artery disease) ([place]) 03/16/2017  . Acute systolic CHF (congestive heart failure) ([place]) 03/14/2017  . Demand ischemia ([place]) 03/13/2017  . Acute on chronic diastolic (congestive) heart failure ([place]) 03/13/2017  . Thrombocytosis ([place]) 03/13/2017  . Protein-calorie malnutrition, severe 03/13/2017  . A-fib ([place]) 03/12/2017  . Left leg cellulitis 03/12/2017  . Hypothyroidism 03/12/2017  . Other specified hypothyroidism 01/12/2015  . Alzheimer's dementia 01/12/2015  . CARDIOMYOPATHY, ALCOHOLIC 02/58/5277  . Essential hypertension 04/05/2009  . PERSONAL HISTORY OF SUDDEN CARDIAC ARREST 04/05/2009    Orientation RESPIRATION BLADDER Height & Weight     Self  O2(Nasal Canula 2 L) Incontinent, External catheter Weight: 155 lb 6.8 oz (70.5 kg) Height:  5\' 9"  (175.3 cm)  BEHAVIORAL SYMPTOMS/MOOD NEUROLOGICAL BOWEL NUTRITION STATUS  (None) (Alzheimer's) Incontinent Diet(2 gram sodium)  AMBULATORY STATUS COMMUNICATION OF NEEDS Skin   Extensive Assist Verbally Other (Comment)(Blister, MASD, Cellulitis)                       Personal Care Assistance Level of Assistance  Bathing, Feeding, Dressing Bathing Assistance: Limited  assistance Feeding assistance: Limited assistance Dressing Assistance: Limited assistance     Functional Limitations Info  Sight, Hearing, Speech Sight Info: Adequate Hearing Info: Adequate Speech Info: Adequate    SPECIAL CARE FACTORS FREQUENCY  PT (By licensed PT), Blood pressure, OT (By licensed OT)     PT Frequency: 5 x week OT Frequency: 5 x week            Contractures Contractures Info: Not present    Additional Factors Info  Code Status, Allergies Code Status Info: Full Allergies Info: Codeine, Contrast Media (Iodinated Diagnostic Agents), Diazepam, Meperidine Hcl, Morphine           Current Medications (03/26/2017):  This is the current hospital active medication list Current Facility-Administered Medications  Medication Dose Route Frequency Provider Last Rate Last Dose  . 0.9 %  sodium chloride infusion  250 mL Intravenous PRN Debbe Odea, MD      . acetaminophen (TYLENOL) tablet 650 mg  650 mg Oral Q6H PRN Debbe Odea, MD   650 mg at 03/24/17 2139   Or  . acetaminophen (TYLENOL) suppository 650 mg  650 mg Rectal Q6H PRN Debbe Odea, MD      . ALPRAZolam Duanne Moron) tablet 0.5 mg  0.5 mg Oral Daily PRN Samuella Cota, MD   0.5 mg at 03/24/17 2139  . alum & mag hydroxide-simeth (MAALOX/MYLANTA) 200-200-20 MG/5ML suspension 15 mL  15 mL Oral Q6H PRN Samuella Cota, MD   15 mL at 03/21/17 2251  . atorvastatin (LIPITOR) tablet 20 mg  20 mg Oral q1800 Samuella Cota, MD   20 mg at 03/25/17 1808  .  calcium carbonate (TUMS - dosed in mg elemental calcium) chewable tablet 200 mg of elemental calcium  1 tablet Oral TID WC Alma Friendly, MD   200 mg of elemental calcium at 03/26/17 0922  . collagenase (SANTYL) ointment   Topical Daily Samuella Cota, MD      . digoxin Fonnie Birkenhead) tablet 0.125 mg  0.125 mg Oral Daily Arnoldo Lenis, MD   0.125 mg at 03/26/17 9629  . diphenhydrAMINE (BENADRYL) injection 12.5 mg  12.5 mg Intravenous Q6H PRN Donne Hazel, MD   12.5 mg at 03/19/17 1621  . feeding supplement (ENSURE ENLIVE) (ENSURE ENLIVE) liquid 237 mL  237 mL Oral BID BM Donne Hazel, MD   237 mL at 03/25/17 1002  . furosemide (LASIX) tablet 40 mg  40 mg Oral Daily Erlene Quan, PA-C   40 mg at 03/26/17 5284  . heparin ADULT infusion 100 units/mL (25000 units/237mL sodium chloride 0.45%)  1,050 Units/hr Intravenous Continuous Lyndee Leo, RPH 10.5 mL/hr at 03/26/17 0559 1,050 Units/hr at 03/26/17 0559  . levothyroxine (SYNTHROID, LEVOTHROID) tablet 150 mcg  150 mcg Oral QAC breakfast Samuella Cota, MD   150 mcg at 03/26/17 0558  . losartan (COZAAR) tablet 12.5 mg  12.5 mg Oral Daily Minus Breeding, MD   12.5 mg at 03/26/17 1324  . metoprolol tartrate (LOPRESSOR) tablet 50 mg  50 mg Oral BID Skeet Latch, MD   50 mg at 03/26/17 4010  . multivitamin with minerals tablet 1 tablet  1 tablet Oral Daily Samuella Cota, MD   1 tablet at 03/26/17 2725  . ondansetron (ZOFRAN) tablet 4 mg  4 mg Oral Q6H PRN Debbe Odea, MD       Or  . ondansetron (ZOFRAN) injection 4 mg  4 mg Intravenous Q6H PRN Debbe Odea, MD   4 mg at 03/21/17 0306  . sodium chloride flush (NS) 0.9 % injection 3 mL  3 mL Intravenous Q12H Debbe Odea, MD   3 mL at 03/26/17 0926  . sodium chloride flush (NS) 0.9 % injection 3 mL  3 mL Intravenous PRN Debbe Odea, MD         Discharge Medications: Please see discharge summary for a list of discharge medications.  Relevant Imaging Results:  Relevant Lab Results:   Additional Information SS#: [id number]  Candie Chroman, LCSW

## 2017-03-26 NOTE — Clinical Social Work Note (Addendum)
Clinicals faxed to [facility] for authorization review. PT/OT have not seen her since 1/7 so will need updated notes.  Dayton Scrape, CSW [phone]  11:59 am Today's PT note faxed to [facility].  Dayton Scrape, CSW [phone]  3:12 pm Authorization still pending.  Dayton Scrape, [place]

## 2017-03-26 NOTE — Progress Notes (Signed)
PROGRESS NOTE  Tina West IFO:[id number] DOB: 08-24-1928 DOA: 03/12/2017 PCP: Claretta Fraise, MD  HPI/Recap of past 54 hours: 84 year old female with history of atrial fibrillation, diastolic CHF, hypothyroidism and anxiety presented with worsening shortness of breath for 2 weeks.Patient was admitted for atrial fibrillation with rapid ventricular rate and elevated troponin. Admitted for further management. Cardiology was consulted. Echo showed ejection fraction of 15-20%. Heart rate control has been difficult; responded well to diuresis.  Today, pt reported feeling better, denies any new complaints. Denies chest pain or worsening SOB. Noted pain/erythema under bilateral breasts.  Assessment/Plan: Principal Problem:   A-fib ([place]) Active Problems:   Essential hypertension   Left leg cellulitis   Hypothyroidism   Demand ischemia (HCC)   Acute on chronic diastolic (congestive) heart failure (HCC)   Thrombocytosis (HCC)   Protein-calorie malnutrition, severe   Acute systolic CHF (congestive heart failure) (HCC)   PAD (peripheral artery disease) (HCC)  Atrial fibrillation with rapid ventricular rate Currently rate controlled -CHA2DS2-VASc5. -Continue lopressor and digoxin as per cardiology. Patient is started on apixiban  -Patient noted to have poor standing balance per PT with history of multiple falls prior to admission and questionable insight to care -The most reasonable AC to transition pt over will be NOACs, since she will be d/c to a SNF. Cardiology is in agreement -Close monitoring for bleeding  New cardiomyopathywithLVEF 67-67%, acute systolic CHF (new), acute on chronic diastolic CHF with anasarca and associated demand ischemia  Improving Strict input and output. Daily weights. Continue metoprolol s/p heart cath 03/18/17 with findings of 20% ost LM to mid LM, 50%mid LAD, 50% ost 2nd diagonal, 25% 1st mrg to prox LAD, 80% 1st marg -Continue on lasix, BB,  digoxin, ARB. Cardiology signed off  Possible cellulitis left foot with ulcerations noted present on admission - appreciatevascular surgery eval - Course of Keflexcompleted. Cellulitis noted to have resolved  Bilateral moderate peripheral vascular disease with non-compressible tibilal arteries -Vascular surgery recs: Pt is NOT a surgical candidate at this point with acute changes in cardiac function - At this point, manage the feet with wound care:  Continue Santyl as currently ordered  Compression once necrotic skin debrided  Dr. Jess Barters office will contact patient to arrange for outpatient follow-up forsilver impregnated compression stockings(have been in contact with scheduler Stepheney Peele)  Hypothyroidism - Continue on levothyroxine  Thrombocytosis, chronic -Ongoing f/u as an outpatient, ??chronic infection -Daily CBC  Aortic atherosclerosis  -continue statin as tolerated  Severe malnutrition -Continue to follow nutrition recommendations -Remains stable at this time  Fungal infection under bilateral breast Nystatin powder    Code Status: Full  Family Communication: None at bedside  Disposition Plan: SNF placement, once stable   Consultants:  Cardiology   Procedures: Cardiac cath on 03/18/17  Antimicrobials:  Completed Keflex  DVT prophylaxis: Apixiban   Objective: Vitals:   03/25/17 2011 03/25/17 2307 03/26/17 0624 03/26/17 1453  BP: (!) 126/91  119/78 132/85  Pulse: (!) 59 (!) 102 75 93  Resp: 20  18 18   Temp: 97.8 F (36.6 C)  97.7 F (36.5 C) (!) 97.4 F (36.3 C)  TempSrc: Oral  Oral Oral  SpO2: 100%  100% 100%  Weight:   70.5 kg (155 lb 6.8 oz)   Height:        Intake/Output Summary (Last 24 hours) at 03/26/2017 1615 Last data filed at 03/26/2017 1453 Gross per 24 hour  Intake 831.88 ml  Output 675 ml  Net 156.88 ml   Danley Danker  Weights   03/24/17 0554 03/25/17 0700 03/26/17 0624  Weight: 72.2 kg (159 lb 2.8 oz) 72.6 kg  (160 lb 0.9 oz) 70.5 kg (155 lb 6.8 oz)    Exam:   General:  Alert, awake, frail, NAD  Cardiovascular: irregular irregular, no added hrt sound  Respiratory: Clear to auscultation bilaterally  Abdomen: Soft, non-tender, non-distended, BS present  Musculoskeletal: No bilateral edema, left foot dressing c/d/i  Skin: Normal  Psychiatry: Normal affect   Data Reviewed: CBC: Recent Labs  Lab 03/22/17 0456 03/23/17 0526 03/24/17 0637 03/25/17 0810 03/26/17 0710  WBC 10.8* 11.2* 10.5 12.1* 8.2  NEUTROABS  --   --   --   --  6.3  HGB 9.1* 9.1* 9.6* 11.2* 10.0*  HCT 31.6* 32.4* 33.8* 39.2 34.6*  MCV 84.7 86.2 85.1 85.2 84.4  PLT 495* 523* 540* 569* 497*   Basic Metabolic Panel: Recent Labs  Lab 03/22/17 0456 03/23/17 0526 03/24/17 0637 03/25/17 0810 03/26/17 0710  NA 130* 135 136 136 138  K 5.5* 5.1 4.8 4.2 4.7  CL 89* 95* 95* 97* 99*  CO2 35* 35* 34* 29 31  GLUCOSE 103* 128* 106* 106* 99  BUN 54* 44* 34* 28* 30*  CREATININE 0.88 0.81 0.73 0.60 0.66  CALCIUM 8.5* 8.3* 8.4* 8.5* 8.2*   GFR: Estimated Creatinine Clearance: 52.8 mL/min (by C-G formula based on SCr of 0.66 mg/dL). Liver Function Tests: Recent Labs  Lab 03/25/17 0810  AST 43*  ALT 78*  ALKPHOS 71  BILITOT 1.5*  PROT 5.6*  ALBUMIN 3.0*   No results for input(s): LIPASE, AMYLASE in the last 168 hours. No results for input(s): AMMONIA in the last 168 hours. Coagulation Profile: No results for input(s): INR, PROTIME in the last 168 hours. Cardiac Enzymes: No results for input(s): CKTOTAL, CKMB, CKMBINDEX, TROPONINI in the last 168 hours. BNP (last 3 results) No results for input(s): PROBNP in the last 8760 hours. HbA1C: No results for input(s): HGBA1C in the last 72 hours. CBG: Recent Labs  Lab 03/21/17 0343  GLUCAP 185*   Lipid Profile: No results for input(s): CHOL, HDL, LDLCALC, TRIG, CHOLHDL, LDLDIRECT in the last 72 hours. Thyroid Function Tests: No results for input(s): TSH,  T4TOTAL, FREET4, T3FREE, THYROIDAB in the last 72 hours. Anemia Panel: No results for input(s): VITAMINB12, FOLATE, FERRITIN, TIBC, IRON, RETICCTPCT in the last 72 hours. Urine analysis:    Component Value Date/Time   COLORURINE AMBER (A) 03/12/2017 1338   APPEARANCEUR CLEAR 03/12/2017 1338   LABSPEC 1.026 03/12/2017 1338   PHURINE 5.0 03/12/2017 1338   GLUCOSEU 50 (A) 03/12/2017 1338   HGBUR NEGATIVE 03/12/2017 1338   BILIRUBINUR NEGATIVE 03/12/2017 1338   BILIRUBINUR negative 04/18/2015 1114   KETONESUR NEGATIVE 03/12/2017 1338   PROTEINUR 100 (A) 03/12/2017 1338   UROBILINOGEN negative 04/18/2015 1114   NITRITE NEGATIVE 03/12/2017 1338   LEUKOCYTESUR NEGATIVE 03/12/2017 1338   Sepsis Labs: @LABRCNTIP (procalcitonin:4,lacticidven:4)  )No results found for this or any previous visit (from the past 240 hour(s)).    Studies: No results found.  Scheduled Meds: . apixaban  5 mg Oral BID  . atorvastatin  20 mg Oral q1800  . calcium carbonate  1 tablet Oral TID WC  . collagenase   Topical Daily  . digoxin  0.125 mg Oral Daily  . feeding supplement (ENSURE ENLIVE)  237 mL Oral BID BM  . furosemide  40 mg Oral Daily  . levothyroxine  150 mcg Oral QAC breakfast  . losartan  12.5  mg Oral Daily  . metoprolol tartrate  50 mg Oral BID  . multivitamin with minerals  1 tablet Oral Daily  . sodium chloride flush  3 mL Intravenous Q12H    Continuous Infusions: . sodium chloride       LOS: 14 days     Alma Friendly, MD [facility]    If 7PM-7AM, please contact night-coverage [email or web address] Password [email or web address] 03/26/2017, 4:15 PM

## 2017-03-26 NOTE — Progress Notes (Signed)
[place] for heparin > apixaban Indication: atrial fibrillation  Allergies  Allergen Reactions  . Codeine Hives  . Contrast Media [Iodinated Diagnostic Agents] Other (See Comments)    unspecified  . Diazepam Hives  . Meperidine Hcl Other (See Comments)    unspecified  . Morphine Other (See Comments)    "caused heart attack"   Patient Measurements: Height: 5\' 9"  (175.3 cm) Weight: 155 lb 6.8 oz (70.5 kg) IBW/kg (Calculated) : 66.2  Vital Signs: Temp: 97.7 F (36.5 C) (01/10 0624) Temp Source: Oral (01/10 0624) BP: 119/78 (01/10 0624) Pulse Rate: 75 (01/10 0624)  Labs: Recent Labs    03/24/17 0637 03/25/17 0810 03/26/17 0710  HGB 9.6* 11.2* 10.0*  HCT 33.8* 39.2 34.6*  PLT 540* 569* 557*  HEPARINUNFRC 0.48 0.66 0.37  CREATININE 0.73 0.60 0.66   Estimated Creatinine Clearance: 52.8 mL/min (by C-G formula based on SCr of 0.66 mg/dL).  Medical History: Past Medical History:  Diagnosis Date  . A-fib ([place])   . Abdominal cyst    Multiple surgeries in [place]. "Heart Stopped" on the operating table during one of her "cyst" operations, probably 15-20 years ago  . Anxiety   . Diastolic CHF, acute (HCC)    BNP 179 in 1/11. Echo (1/11) with EF 60%, mild focal basal septal hypertrophy, grade I diastolic dysfunction, mild left atrial enlargement.   Marland Kitchen HTN (hypertension)   . Hypothyroidism   . Obesity   . PAD (peripheral artery disease) ([place]) 03/16/2017  . S/P TAH (total abdominal hysterectomy)    Assessment: Anticoag: Heparin for afib (CHADSVASC= 5) resumed s/p cath 1/2. No PTA AC.   CBC stable. No bleeding documented.   Transition to apixaban prior to discharge.  Goal of Therapy:  Heparin level 0.3-0.7 units/ml Monitor platelets by anticoagulation protocol: Yes   Plan:  Discontinue heparin Start apixaban 5mg  BID (Age 84, wt 70.5 kg, Scr 0.66) Monitor CBC and signs/symptoms of bleeding.  Thank you for allowing [place] to  participate in this patients care.  Mariella Saa, PharmD Clinical Pharmacist  03/26/2017 10:01 AM

## 2017-03-26 NOTE — Progress Notes (Signed)
Physical Therapy Treatment Patient Details Name: Tina West MRN: [id number] DOB: 24-Sep-1928 Today's Date: 03/26/2017    History of Present Illness 84yo presented with LE edema, orthopnea, DOE. Admitted for (new) afib/RVR, volume overload, elevated troponin. HR control has been difficult; responded well to diuresis; echo showed new cardiomyopathy LVEF 15-20%. S/p RIGHT/LEFT HEART CATH AND CORONARY ANGIOGRAPHY 03/18/17.    PT Comments    Pt performed increased activity during session including multiple standing trials from various surfaces.  Plan for SNF remains appropriate.  Cognition remains impaired.  Plan next session for continued standing trials.      Follow Up Recommendations  SNF;Supervision/Assistance - 24 hour     Equipment Recommendations  Rolling walker with 5" wheels    Recommendations for Other Services       Precautions / Restrictions Precautions Precautions: Fall Restrictions Weight Bearing Restrictions: No    Mobility  Bed Mobility Overal bed mobility: Needs Assistance Bed Mobility: Rolling;Sidelying to Sit Rolling: Mod assist;+2 for physical assistance Sidelying to sit: Max assist;Mod assist       General bed mobility comments: Pt requirec cues for flexing R knee to roll left.  Pt required max assistance to advance LEs to edge of bed and mod assist to elevate trunk into sitting.    Transfers Overall transfer level: Needs assistance Equipment used: None(sara stedy.  ) Transfers: Sit to/from Stand Sit to Stand: Max assist;+2 physical assistance(max +1 from elevated surface and max +2 from low seated surface.  )         General transfer comment: Cues for hand placement on cross bar of steady frame to pull into standing.  Pt required cues hip extension, knee extension and trunk extension.  Pt performed multiple reps to and from seated surfaces of varrying heights.  Pt had BM sitting on commode which required increased trials of standing to transfer to  commode and stand for pericare.    Ambulation/Gait Ambulation/Gait assistance: (NT poor tolerance noted to standing.  )               Stairs            Wheelchair Mobility    Modified Rankin (Stroke Patients Only)       Balance Overall balance assessment: Needs assistance   Sitting balance-Leahy Scale: Fair       Standing balance-Leahy Scale: Poor                              Cognition Arousal/Alertness: Awake/alert Behavior During Therapy: Anxious Overall Cognitive Status: Impaired/Different from baseline Area of Impairment: Memory;Following commands;Safety/judgement;Awareness;Problem solving;Orientation                 Orientation Level: Person;Place   Memory: Decreased short-term memory Following Commands: Follows one step commands consistently;Follows multi-step commands inconsistently Safety/Judgement: Decreased awareness of safety;Decreased awareness of deficits   Problem Solving: Slow processing;Decreased initiation General Comments: Pt with non sensical conversation bouncing from topic to topic.      Exercises      General Comments        Pertinent Vitals/Pain Pain Assessment: 0-10 Faces Pain Scale: Hurts whole lot Pain Location: back, buttocks, chest underneath her breasts.   Pain Descriptors / Indicators: Aching;Sore Pain Intervention(s): Monitored during session;Repositioned(RN applied barrier cream to bottom and foam border to back.  )    Home Living  Prior Function            PT Goals (current goals can now be found in the care plan section) Acute Rehab PT Goals Patient Stated Goal: go home Potential to Achieve Goals: Fair Progress towards PT goals: Progressing toward goals    Frequency    Min 2X/week      PT Plan Current plan remains appropriate    Co-evaluation              AM-PAC PT "6 Clicks" Daily Activity  Outcome Measure  Difficulty turning over in bed  (including adjusting bedclothes, sheets and blankets)?: Unable Difficulty moving from lying on back to sitting on the side of the bed? : Unable Difficulty sitting down on and standing up from a chair with arms (e.g., wheelchair, bedside commode, etc,.)?: Unable Help needed moving to and from a bed to chair (including a wheelchair)?: A Lot Help needed walking in hospital room?: Total Help needed climbing 3-5 steps with a railing? : Total 6 Click Score: 7    End of Session   Activity Tolerance: Patient limited by fatigue Patient left: in bed;with nursing/sitter in room Nurse Communication: Mobility status(sucessful BM, and skin integrity) PT Visit Diagnosis: Unsteadiness on feet (R26.81);Muscle weakness (generalized) (M62.81);Difficulty in walking, not elsewhere classified (R26.2);Pain Pain - part of body: (back)     Time: 4825-0037 PT Time Calculation (min) (ACUTE ONLY): 43 min  Charges:  $Therapeutic Activity: 38-52 mins                    G Codes:       Governor Rooks, PTA pager [phone]    Cristela Blue 03/26/2017, 11:26 AM

## 2017-03-26 NOTE — Discharge Instructions (Signed)

## 2017-03-27 LAB — BASIC METABOLIC PANEL
ANION GAP: 10 (ref 5–15)
BUN: 25 mg/dL — AB (ref 6–20)
CALCIUM: 8.6 mg/dL — AB (ref 8.9–10.3)
CO2: 27 mmol/L (ref 22–32)
Chloride: 98 mmol/L — ABNORMAL LOW (ref 101–111)
Creatinine, Ser: 0.6 mg/dL (ref 0.44–1.00)
GFR calc Af Amer: >60 mL/min (ref >60)
GLUCOSE: 91 mg/dL (ref 65–99)
Potassium: 4.5 mmol/L (ref 3.5–5.1)
Sodium: 135 mmol/L (ref 135–145)

## 2017-03-27 MED ORDER — METOPROLOL TARTRATE 50 MG PO TABS: 50.0000 mg | ORAL_TABLET | Freq: Two times a day (BID) | ORAL | Status: AC

## 2017-03-27 MED ORDER — APIXABAN 5 MG PO TABS: 5.0000 mg | ORAL_TABLET | Freq: Two times a day (BID) | ORAL | Status: AC

## 2017-03-27 MED ORDER — COLLAGENASE 250 UNIT/GM EX OINT: TOPICAL_OINTMENT | Freq: Every day | CUTANEOUS | 0 refills | Status: AC

## 2017-03-27 MED ORDER — LOSARTAN POTASSIUM 25 MG PO TABS: 12.5000 mg | ORAL_TABLET | Freq: Every day | ORAL | Status: AC

## 2017-03-27 MED ORDER — NYSTATIN 100000 UNIT/GM EX POWD: Freq: Three times a day (TID) | CUTANEOUS | 0 refills | Status: AC

## 2017-03-27 MED ORDER — ATORVASTATIN CALCIUM 20 MG PO TABS: 20.0000 mg | ORAL_TABLET | Freq: Every day | ORAL | Status: AC

## 2017-03-27 MED ORDER — LEVOTHYROXINE SODIUM 150 MCG PO TABS: 150.0000 ug | ORAL_TABLET | Freq: Every day | ORAL | Status: AC

## 2017-03-27 MED ORDER — DIGOXIN 125 MCG PO TABS: 0.1250 mg | ORAL_TABLET | Freq: Every day | ORAL | Status: AC

## 2017-03-27 MED ORDER — ADULT MULTIVITAMIN W/MINERALS CH: 1.0000 | ORAL_TABLET | Freq: Every day | ORAL | Status: AC

## 2017-03-27 MED ORDER — FUROSEMIDE 40 MG PO TABS: 40.0000 mg | ORAL_TABLET | Freq: Every day | ORAL | Status: AC

## 2017-03-27 NOTE — Telephone Encounter (Signed)
IC pt's home number, it is not available at this time.  [phone] (Home) I was following up on this one for you for appt, patient has been discharged now.

## 2017-03-27 NOTE — Clinical Social Work Note (Addendum)
SNF insurance authorization still pending but it has been assigned to a clinician and they are working on it. [facility] staff member CSW spoke with said we should have answer sometime this morning but if not, call again this afternoon.  Dayton Scrape, [place] [phone]  11:08 am Insurance authorization approved: [id number]. SNF aware. CSW paged MD to notify.  Dayton Scrape, [place]

## 2017-03-27 NOTE — Discharge Summary (Signed)
Discharge Summary  Tina West TGY:[id number] DOB: Jun 04, 1928  PCP: Claretta Fraise, MD  Admit date: 03/12/2017 Discharge date: 03/27/2017  Time spent: >30 mins  Recommendations for Outpatient Follow-up:  1. PCP 2. Cardiology   Discharge Diagnoses:  Active Hospital Problems   Diagnosis Date Noted  . A-fib ([place]) 03/12/2017  . PAD (peripheral artery disease) ([place]) 03/16/2017  . Acute systolic CHF (congestive heart failure) ([place]) 03/14/2017  . Demand ischemia ([place]) 03/13/2017  . Acute on chronic diastolic (congestive) heart failure ([place]) 03/13/2017  . Thrombocytosis ([place]) 03/13/2017  . Protein-calorie malnutrition, severe 03/13/2017  . Left leg cellulitis 03/12/2017  . Hypothyroidism 03/12/2017  . Essential hypertension 04/05/2009    Resolved Hospital Problems   Diagnosis Date Noted Date Resolved  . Pedal edema 03/12/2017 03/13/2017    Discharge Condition: Stable  Diet recommendation: Heart healthy  Vitals:   03/26/17 2304 03/27/17 0537  BP: 135/87 138/62  Pulse: 96 (!) 50  Resp:  18  Temp:  97.6 F (36.4 C)  SpO2:  100%    History of present illness:  84 year old female with history of atrial fibrillation, diastolic CHF, hypothyroidism and anxiety presented with worsening shortness of breath for 2 weeks.Patient was admitted for atrial fibrillation with rapid ventricular rate and elevated troponin. Admitted for further management. Cardiology was consulted. Echo showed ejection fraction of 15-20%.   Today, pt reported feeling better, denies any new complaints. Denies chest pain or worsening SOB.  Hospital Course:  Principal Problem:   A-fib [facility]) Active Problems:   Essential hypertension   Left leg cellulitis   Hypothyroidism   Demand ischemia (HCC)   Acute on chronic diastolic (congestive) heart failure (HCC)   Thrombocytosis (HCC)   Protein-calorie malnutrition, severe   Acute systolic CHF (congestive heart failure) (HCC)   PAD (peripheral artery  disease) (HCC)  Atrial fibrillation with rapid ventricular rate Currently rate controlled -CHA2DS2-VASc5. -Continue lopressor and digoxin as per cardiology. Patient is started on apixiban  -Patient noted to have poor standing balance per PTwith history of multiple falls prior to admission and questionable insight to care -The most reasonable AC to transition pt over will be NOACs, since she will be d/c to a SNF. Cardiology is in agreement -Close monitoring for bleeding  New cardiomyopathywithLVEF 28-76%, acute systolic CHF (new), acute on chronic diastolic CHF with anasarca and associated demand ischemia  Improved ECHO: EF of 15-20% s/p heart cath 03/18/17 with findings of 20% ost LM to mid LM, 50%mid LAD, 50% ost 2nd diagonal, 25% 1st mrg to prox LAD, 80% 1st marginal (heavily calcified). No stent placed. Cardiology rec aggressive management of systolic heart failure -Continue on lasix, BB, digoxin, ARB. Follow up with cardiology  Possible cellulitis left foot with ulcerations noted present on admission - appreciatevascular surgery eval - Course of Keflexcompleted. Cellulitisnoted to have resolved  Bilateral moderate peripheral vascular disease with non-compressible tibilal arteries -Vascular surgery recs: Pt is NOT a surgical candidate at this point with acute changes in cardiac function - At this point, manage the feet with wound care:  Continue Santyl as currently ordered  Compression once necrotic skin debrided  Dr. Jess Barters office will contact patient to arrange for outpatient follow-up forsilver impregnated compression stockings(have been in contact with scheduler Rozell Searing)  Hypothyroidism -Continue onlevothyroxine  Thrombocytosis, chronic -Ongoing f/u as an outpatient with PCP  Aortic atherosclerosis  -continue statin as tolerated  Severe malnutrition -Continue to follow nutrition recommendations, ensure supplements TID with meals  Fungal  infection under bilateral breast Nystatin powder  Procedures:  Cardiac cath on 03/18/17  Consultations:  Cardiology  Discharge Exam: BP 138/62 (BP Location: Left Arm)   Pulse (!) 50   Temp 97.6 F (36.4 C) (Oral)   Resp 18   Ht 5\' 9"  (1.753 m)   Wt 72.7 kg (160 lb 4.4 oz)   SpO2 100%   BMI 23.67 kg/m   General: Awake, alert, frail, NAD Cardiovascular: irregular irregular, no added hrt sound Respiratory: Clear to auscultation bilaterally   Discharge Instructions You were cared for by a hospitalist during your hospital stay. If you have any questions about your discharge medications or the care you received while you were in the hospital after you are discharged, you can call the unit and asked to speak with the hospitalist on call if the hospitalist that took care of you is not available. Once you are discharged, your primary care physician will handle any further medical issues. Please note that NO REFILLS for any discharge medications will be authorized once you are discharged, as it is imperative that you return to your primary care physician (or establish a relationship with a primary care physician if you do not have one) for your aftercare needs so that they can reassess your need for medications and monitor your lab values.  Discharge Instructions    Diet - low sodium heart healthy   Complete by:  As directed    Increase activity slowly   Complete by:  As directed      Allergies as of 03/27/2017      Reactions   Codeine Hives   Contrast Media [iodinated Diagnostic Agents] Other (See Comments)   unspecified   Diazepam Hives   Meperidine Hcl Other (See Comments)   unspecified   Morphine Other (See Comments)   "caused heart attack"      Medication List    STOP taking these medications   benazepril-hydrochlorthiazide 20-12.5 MG tablet Commonly known as:  LOTENSIN HCT   metoprolol succinate 100 MG 24 hr tablet Commonly known as:  TOPROL-XL     TAKE these  medications   acetaminophen 325 MG tablet Commonly known as:  TYLENOL Take 650 mg by mouth every 6 (six) hours as needed for mild pain.   ALPRAZolam 0.25 MG tablet Commonly known as:  XANAX Take 1 tablet (0.25 mg total) by mouth 2 (two) times daily. What changed:    how much to take  when to take this  reasons to take this   apixaban 5 MG Tabs tablet Commonly known as:  ELIQUIS Take 1 tablet (5 mg total) by mouth 2 (two) times daily.   ASPIR-LOW 81 MG EC tablet Generic drug:  aspirin Take 81 mg by mouth daily.   atorvastatin 20 MG tablet Commonly known as:  LIPITOR Take 1 tablet (20 mg total) by mouth daily at 6 PM.   collagenase ointment Commonly known as:  SANTYL Apply topically daily. Start taking on:  03/28/2017   digoxin 0.125 MG tablet Commonly known as:  LANOXIN Take 1 tablet (0.125 mg total) by mouth daily. Start taking on:  03/28/2017   donepezil 10 MG tablet Commonly known as:  ARICEPT Take 0.5 tablets (5 mg total) by mouth at bedtime.   furosemide 40 MG tablet Commonly known as:  LASIX Take 1 tablet (40 mg total) by mouth daily. What changed:    how much to take  how to take this  when to take this  additional instructions   ibuprofen 200 MG tablet Commonly known  as:  ADVIL,MOTRIN Take 200 mg by mouth every 6 (six) hours as needed for moderate pain.   levothyroxine 150 MCG tablet Commonly known as:  SYNTHROID, LEVOTHROID Take 1 tablet (150 mcg total) by mouth daily before breakfast. Start taking on:  03/28/2017 What changed:    medication strength  how much to take  when to take this   losartan 25 MG tablet Commonly known as:  COZAAR Take 0.5 tablets (12.5 mg total) by mouth daily. Start taking on:  03/28/2017   metoprolol tartrate 50 MG tablet Commonly known as:  LOPRESSOR Take 1 tablet (50 mg total) by mouth 2 (two) times daily.   multivitamin with minerals Tabs tablet Take 1 tablet by mouth daily. Start taking on:   03/28/2017   NYQUIL COLD & FLU PO Take 15 mLs by mouth at bedtime as needed (cough).   nystatin powder Commonly known as:  MYCOSTATIN/NYSTOP Apply topically 3 (three) times daily.      Allergies  Allergen Reactions  . Codeine Hives  . Contrast Media [Iodinated Diagnostic Agents] Other (See Comments)    unspecified  . Diazepam Hives  . Meperidine Hcl Other (See Comments)    unspecified  . Morphine Other (See Comments)    "caused heart attack"    Contact information for follow-up providers    Claretta Fraise, MD. Schedule an appointment as soon as possible for a visit in 1 week(s).   Specialty:  Family Medicine Contact information: [place] [address] [phone]        Pixie Casino, MD .   Specialty:  Cardiology Contact information: [address] [place] [address] [phone]            Contact information for after-discharge care    [facility] Follow up.   Service:  Skilled Nursing Contact information: [address] [place] [phone]                   The results of significant diagnostics from this hospitalization (including imaging, microbiology, ancillary and laboratory) are listed below for reference.    Significant Diagnostic Studies: Ct Abdomen Pelvis Wo Contrast  Result Date: 03/12/2017 CLINICAL DATA:  Shortness of breath for several weeks. Upper abdominal pain. EXAM: CT ABDOMEN AND PELVIS WITHOUT CONTRAST TECHNIQUE: Multidetector CT imaging of the abdomen and pelvis was performed following the standard protocol without IV contrast. COMPARISON:  None. FINDINGS: Motion degraded study. Lower chest: Small to moderate bilateral pleural effusions with bibasilar collapse/ consolidation. The heart is enlarged. Coronary artery calcification is evident. Ground-glass attenuation in the lower lungs suggests edema. Hepatobiliary: No focal abnormality in the  liver on this study without intravenous contrast. Gallbladder surgically absent. No intrahepatic or extrahepatic biliary dilation. Pancreas: No focal mass lesion. No dilatation of the main duct. No intraparenchymal cyst. No peripancreatic edema. Spleen: No splenomegaly. No focal mass lesion. Adrenals/Urinary Tract: No adrenal nodule or mass. Kidneys are unremarkable. No evidence for hydroureter. Bladder decompressed. Stomach/Bowel: Stomach is nondistended. No gastric wall thickening. No evidence of outlet obstruction. Distal stomach and proximal duodenum not well characterized due to the motion artifact. No small bowel wall thickening. No small bowel dilatation. Terminal ileum not well seen. The appendix is not visualized, but there is no edema or inflammation in the region of the cecum. Diverticular changes are noted in the left colon without evidence of diverticulitis. Vascular/Lymphatic: There is abdominal aortic atherosclerosis without aneurysm. There  is no gastrohepatic or hepatoduodenal ligament lymphadenopathy. No intraperitoneal or retroperitoneal lymphadenopathy. No pelvic sidewall lymphadenopathy. Reproductive: Uterus surgically absent.  There is no adnexal mass. Other: Apparent edema/ fluid in the retroperitoneum of the right abdomen, tracking in the anterior pararenal space inferior to the duodenum and down along the right psoas muscle. Musculoskeletal: Diffuse body wall edema bones are diffusely demineralized age indeterminate L2 compression fracture noted. IMPRESSION: 1. Study is limited by lack of intravenous contrast and patient motion. There appears to be a small amount of retroperitoneal edema inferior to the duodenum and pancreatic head. Duodenitis/peptic ulcer disease could cause this appearance. Groove pancreatitis would be another consideration. Gallbladder is surgically absent. 2. Diffuse body wall edema. 3. Small the moderate bilateral pleural effusions with diffuse ground-glass attenuation  in the lower lungs suggesting edema. 4.  Aortic Atherosclerois (ICD10-170.0) 5. Colonic diverticulosis without definite findings of diverticulitis. Electronically Signed   By: Misty Stanley M.D.   On: 03/12/2017 14:17   Dg Chest 2 View  Result Date: 03/12/2017 CLINICAL DATA:  84 year old female with a history of shortness of breath an weakness EXAM: CHEST  2 VIEW COMPARISON:  None. FINDINGS: Cardiomediastinal silhouette enlarged. Interlobular septal thickening. Low lung volumes with apical kyphotic positioning secondary to kyphotic curvature of the spine. Lateral view demonstrates small pleural effusions. IMPRESSION: Small pleural effusions with evidence of mild edema. Electronically Signed   By: Corrie Mckusick D.O.   On: 03/12/2017 12:32   Dg Chest Port 1 View  Result Date: 03/19/2017 CLINICAL DATA:  Shortness of Breath EXAM: PORTABLE CHEST 1 VIEW COMPARISON:  03/12/2017 FINDINGS: Cardiomegaly with vascular congestion. Small bilateral effusions with bibasilar and perihilar opacities. This could reflect edema or infection. No acute bony abnormality. IMPRESSION: Cardiomegaly. Bilateral perihilar and lower lobe edema versus infection. Small bilateral effusions. Electronically Signed   By: Rolm Baptise M.D.   On: 03/19/2017 10:45   Dg Foot Complete Left  Result Date: 03/12/2017 CLINICAL DATA:  Left foot necrosis EXAM: LEFT FOOT - COMPLETE 3+ VIEW COMPARISON:  None. FINDINGS: Diffuse osteopenia limits the exam. Mild degenerative changes at the DIP and PIP joints. Mild degenerative change at the first MTP joint. No obvious fracture. Vascular calcifications. Heterogeneous lucency in the calcaneus bone, possibly due to osteopenia. Dorsal soft tissue swelling. No soft tissue gas. IMPRESSION: Diffuse osteopenia limits the examination. No definite acute osseous abnormality is seen. Heterogeneous lucencies in the calcaneus and metatarsals could be secondary to demineralization Electronically Signed   By: Donavan Foil M.D.   On: 03/12/2017 15:14    Microbiology: No results found for this or any previous visit (from the past 240 hour(s)).   Labs: Basic Metabolic Panel: Recent Labs  Lab 03/23/17 0526 03/24/17 0637 03/25/17 0810 03/26/17 0710 03/27/17 0445  NA 135 136 136 138 135  K 5.1 4.8 4.2 4.7 4.5  CL 95* 95* 97* 99* 98*  CO2 35* 34* 29 31 27   GLUCOSE 128* 106* 106* 99 91  BUN 44* 34* 28* 30* 25*  CREATININE 0.81 0.73 0.60 0.66 0.60  CALCIUM 8.3* 8.4* 8.5* 8.2* 8.6*   Liver Function Tests: Recent Labs  Lab 03/25/17 0810  AST 43*  ALT 78*  ALKPHOS 71  BILITOT 1.5*  PROT 5.6*  ALBUMIN 3.0*   No results for input(s): LIPASE, AMYLASE in the last 168 hours. No results for input(s): AMMONIA in the last 168 hours. CBC: Recent Labs  Lab 03/22/17 0456 03/23/17 0526 03/24/17 0637 03/25/17 0810 03/26/17 0710  WBC 10.8* 11.2*  10.5 12.1* 8.2  NEUTROABS  --   --   --   --  6.3  HGB 9.1* 9.1* 9.6* 11.2* 10.0*  HCT 31.6* 32.4* 33.8* 39.2 34.6*  MCV 84.7 86.2 85.1 85.2 84.4  PLT 495* 523* 540* 569* 557*   Cardiac Enzymes: No results for input(s): CKTOTAL, CKMB, CKMBINDEX, TROPONINI in the last 168 hours. BNP: BNP (last 3 results) Recent Labs    03/12/17 2335  BNP 1,846.7*    ProBNP (last 3 results) No results for input(s): PROBNP in the last 8760 hours.  CBG: Recent Labs  Lab 03/21/17 0343  GLUCAP 185*       Signed:  Alma Friendly, MD [facility] 03/27/2017, 11:51 AM

## 2017-03-27 NOTE — Progress Notes (Signed)
Pt transported to SNF by EMS IV out tele off [place] daughter notified.

## 2017-03-27 NOTE — Clinical Social Work Placement (Signed)
   CLINICAL SOCIAL WORK PLACEMENT  NOTE  Date:  03/27/2017  Patient Details  Name: Tina West MRN: [id number] Date of Birth: June 27, 1928  Clinical Social Work is seeking post-discharge placement for this patient at the [place] level of care (*CSW will initial, date and re-position this form in  chart as items are completed):  Yes   Patient/family provided with [facility] Work Department's list of facilities offering this level of care within the geographic area requested by the patient (or if unable, by the patient's family).  Yes   Patient/family informed of their freedom to choose among providers that offer the needed level of care, that participate in Medicare, Medicaid or managed care program needed by the patient, have an available bed and are willing to accept the patient.  Yes   Patient/family informed of [facility]'s ownership interest in [facility] and [facility], as well as of the fact that they are under no obligation to receive care at these facilities.  PASRR submitted to EDS on 03/18/17     PASRR number received on 03/19/17     Existing PASRR number confirmed on       FL2 transmitted to all facilities in geographic area requested by pt/family on 03/18/17     FL2 transmitted to all facilities within larger geographic area on       Patient informed that his/her managed care company has contracts with or will negotiate with certain facilities, including the following:        Yes   Patient/family informed of bed offers received.  Patient chooses bed at [facility]     Physician recommends and patient chooses bed at      Patient to be transferred to [facility] on 03/27/17.  Patient to be transferred to facility by [facility]     Patient family notified on 03/27/17 of transfer.  Name of family member notified:  Breasia Karges     PHYSICIAN Please prepare prescriptions     Additional Comment:     _______________________________________________ Candie Chroman, LCSW 03/27/2017, 12:28 PM

## 2017-03-27 NOTE — Progress Notes (Signed)
Occupational Therapy Treatment Patient Details Name: Tina West MRN: [id number] DOB: 04/04/1928 Today's Date: 03/27/2017    History of present illness 84yo presented with LE edema, orthopnea, DOE. Admitted for (new) afib/RVR, volume overload, elevated troponin. HR control has been difficult; responded well to diuresis; echo showed new cardiomyopathy LVEF 15-20%. S/p RIGHT/LEFT HEART CATH AND CORONARY ANGIOGRAPHY 03/18/17.   OT comments  Pt progressing towards OT goals this session. Pt able to make transfer to recliner with mod A +2 and perform seated grooming with set up. Pt continues to display cognitive deficits (confusion) although very pleasant and cooperative. Pt set to transfer to SNF later today and OT should continue at that level to maximize safety and independence in ADL and functional transfers to return to PLOF.   Follow Up Recommendations  SNF;Supervision/Assistance - 24 hour    Equipment Recommendations  Other (comment)(defer to next venue)    Recommendations for Other Services      Precautions / Restrictions Precautions Precautions: Fall Restrictions Weight Bearing Restrictions: No       Mobility Bed Mobility Overal bed mobility: Needs Assistance Bed Mobility: Supine to Sit     Supine to sit: +2 for physical assistance;Mod assist     General bed mobility comments: Pt required cues for sequencing.  Pt required max assistance to advance LEs to edge of bed and mod assist to elevate trunk into sitting.    Transfers Overall transfer level: Needs assistance Equipment used: 2 person hand held assist Transfers: Sit to/from [facility] Sit to Stand: Mod assist;+2 physical assistance;+2 safety/equipment Stand pivot transfers: Mod assist;+2 physical assistance;+2 safety/equipment       General transfer comment: use of chair pad to assist with transfer    Balance Overall balance assessment: Needs assistance Sitting-balance support: Bilateral  upper extremity supported;Feet supported Sitting balance-Leahy Scale: Fair Sitting balance - Comments: able to sit min guard EOB   Standing balance support: Bilateral upper extremity supported;During functional activity Standing balance-Leahy Scale: Poor Standing balance comment: reliant on B UE support                            ADL either performed or assessed with clinical judgement   ADL Overall ADL's : Needs assistance/impaired     Grooming: Set up;Wash/dry hands;Wash/dry face;Sitting Grooming Details (indicate cue type and reason): in recliner                 Toilet Transfer: +2 for physical assistance;+2 for safety/equipment;Moderate assistance;Stand-pivot Toilet Transfer Details (indicate cue type and reason): 2 person HHA Toileting- Clothing Manipulation and Hygiene: Total assistance       Functional mobility during ADLs: Moderate assistance;+2 for physical assistance;+2 for safety/equipment General ADL Comments: Pt required multimodal cues and topics of conversation were variable and non-related     Vision       Perception     Praxis      Cognition Arousal/Alertness: Awake/alert Behavior During Therapy: Anxious Overall Cognitive Status: Impaired/Different from baseline Area of Impairment: Memory;Following commands;Safety/judgement;Awareness;Problem solving;Orientation                 Orientation Level: Person;Place   Memory: Decreased short-term memory Following Commands: Follows one step commands consistently;Follows multi-step commands inconsistently Safety/Judgement: Decreased awareness of safety;Decreased awareness of deficits Awareness: Emergent Problem Solving: Slow processing;Decreased initiation General Comments: Pt with non sensical conversation bouncing from topic to topic.        Exercises     Shoulder  Instructions       General Comments      Pertinent Vitals/ Pain       Pain Assessment: Faces Faces Pain  Scale: Hurts a little bit Pain Location: back Pain Descriptors / Indicators: Discomfort(itchy) Pain Intervention(s): Monitored during session;Repositioned;Other (comment)(cleaned and applied lotion)  Home Living                                          Prior Functioning/Environment              Frequency  Min 2X/week        Progress Toward Goals  OT Goals(current goals can now be found in the care plan section)  Progress towards OT goals: Progressing toward goals  Acute Rehab OT Goals Patient Stated Goal: go home OT Goal Formulation: With patient Time For Goal Achievement: 04/01/17 Potential to Achieve Goals: Good  Plan Discharge plan remains appropriate    Co-evaluation                 AM-PAC PT "6 Clicks" Daily Activity     Outcome Measure   Help from another person eating meals?: A Little Help from another person taking care of personal grooming?: A Little Help from another person toileting, which includes using toliet, bedpan, or urinal?: Total Help from another person bathing (including washing, rinsing, drying)?: A Lot Help from another person to put on and taking off regular upper body clothing?: A Lot Help from another person to put on and taking off regular lower body clothing?: A Lot 6 Click Score: 13    End of Session Equipment Utilized During Treatment: Gait belt;Oxygen  OT Visit Diagnosis: Muscle weakness (generalized) (M62.81);Other abnormalities of gait and mobility (R26.89);Other symptoms and signs involving cognitive function   Activity Tolerance Patient tolerated treatment well   Patient Left in chair;with call bell/phone within reach;with chair alarm set   Nurse Communication Mobility status;Other (comment)(purewick removed for transfer)        Time: 1204-1220 OT Time Calculation (min): 16 min  Charges: OT General Charges $OT Visit: 1 Visit OT Treatments $Self Care/Home Management : 8-22 mins  Hulda Humphrey OTR/L [place] 03/27/2017, 2:34 PM

## 2017-03-27 NOTE — Clinical Social Work Note (Signed)
CSW facilitated patient discharge including contacting patient family and facility to confirm patient discharge plans. Clinical information faxed to facility and family agreeable with plan. CSW arranged ambulance transport via [facility] to [facility] at 1:30 pm. RN to call report prior to discharge ([phone]).  CSW will sign off for now as social work intervention is no longer needed. Please consult us again if new needs arise.  Donyae Kilner, CSW [phone]   

## 2017-03-27 NOTE — Progress Notes (Signed)
Pt is alert and oriented to self and place with loose thought and off and on talks to self and forget age. Vitals HR has been evaluated with improvement. And plan to d/c to snf today.

## 2017-03-27 NOTE — Progress Notes (Signed)
Pt's HCPOA (from [facility]) would like to be notified prior to patient's discharge so that she can bring the patient some clothes.

## 2017-03-27 NOTE — Progress Notes (Signed)
Attempted to give report to [facility] talked to front desk was transferred to nurse with no answer.

## 2017-04-01 NOTE — Telephone Encounter (Signed)
Looks like pending admission in the hospital? Will continue to hold message and check status of discharge to then offer appt in office.

## 2017-04-03 NOTE — Telephone Encounter (Signed)
I tried calling patient and her phone is not accepting calls, I tried reaching out to son, but there is no answer to either number. Will hold message to try reaching out at another day.

## 2017-04-08 ENCOUNTER — Telehealth (INDEPENDENT_AMBULATORY_CARE_PROVIDER_SITE_OTHER): Payer: Self-pay | Admitting: Orthopedic Surgery

## 2017-04-08 NOTE — Telephone Encounter (Signed)
Received call from Sallye [facility]) she advised patient is in rehab and will be there for 4 to 8 weeks. The number to contact Abigail Butts is [phone]

## 2017-04-08 NOTE — Telephone Encounter (Signed)
Left vm for patient niece Tina West today. Advised our office has been trying to contact patient for appointment over the past 2.5weeks. Advised to please call our office to schedule appointment.

## 2017-04-20 ENCOUNTER — Other Ambulatory Visit: Payer: Self-pay

## 2017-04-20 NOTE — Patient Outreach (Signed)
[facility]) Care Management  04/20/2017  Tina West 26-Oct-1928 [id number]   Telephone Screen  Referral Date: 04/20/17 Referral Source: [employer] Direct Referral Reason: "A-fib, blisters which burst open on her foot leaving ulcers,feet soreness and sweeling, has not taken her meds for many months because she doesn't have a car to get to pharmacy and no way to pay for them" Insurance: [employer]   Incoming call from patient's niece/HC POA([name]) returning RN CM call.   Social: Patient resides in her home along with her son. Patient's niece is primary caregiver for patient. She voices that she goes to patient's house at least twice a day. Once in the morning before she goes to work to get patient bathed and dressed and then in the evenings to get her ready for bed. This creates a strain on the niece since she is still working and also voices she is caregiver for her aging parents as well. She reports that son is in the home but provides limited support and assistance. Patient has had two recent falls per niece report. The most recent one was last night when patient was attempting to go to the bathroom. Caregiver inquiring about in home DME especially a hospital bed to help her better care for patient and ensure safety in the home. She voices patient has a walker but feels like patient really needs rollator with seat as she can only walk short distances without resting.  Caregiver tries to take patients to appts. However, she voices that due to her work schedule it is very challenging. She is interested in transportation resources to ensure patient is able to get to appts.  Conditions: Per chart review, patient has PMH of A-fib, CHF, hypothyroidism, anxiety, anasarca, HTN, PAD and foot ulcers. Patient was recently hospitalized from 03/12/17 to 03/27/17 for A-fib and then transferred to [facility] at [place]. Per notes patient having chronic issues with swelling and soreness to  blistered areas on foot that burst. Caregiver appears overwhelmed with trying to manage all of patient's medical issues and voices she could use some assistance and support.   Medications: Niece reports that patient is taking eight meds. She voices that she fills med planner for patient weekly. Caregiver completely denies patient not being able to afford her meds. She voices that most her meds are $4 in cost. She reports that she does not need assistance with affording meds. She voices her main issue is getting meds to the patient. Patient is adamant that she does not want to use mail order pharmacy because she does not like that concept and idea. Caregiver reports that due to her work schedule and obligations it is sometimes hard for her to go get meds picked up for patient and she needs assistance on how to get meds consistently to patient and in a timely manner.    Appointments: Per niece patient has PCP appt on tomorrow. She is unsure if she will be able to take patient to appt or not. She is also followed by Dr. Sharol Given (ortho) and cardiologist.   Advance Directives: Patient has [facility] POA. Copy on file and in chart.    Consent: [facility] services reviewed and discussed with HC POA/niece. Verbal consent for services given.   Plan: RN CM will notify [facility] administrative assistant of case status. RN CM will send [facility] SW referral for possible transportation and in home support/caregiver resources. RN CM will send [place] referral for possible assistance obtaining meds. RN CM will send [facility]  Community RN referral for transition of care and further in home eval and mgmt of chronic conditions.   Enzo Montgomery, RN,BSN,CCM [place] Management Telephonic Care Management Coordinator Direct Phone: [phone] Toll Free: [phone] Fax: [phone]

## 2017-04-20 NOTE — Patient Outreach (Signed)
[facility]) Care Management  04/20/2017  Margit Batte Sep 10, 1928 [id number]   Telephone Screen  Referral Date: 04/20/17 Referral Source: [employer] Direct Referral Reason: "A-fib, blisters which burst open on her foot leaving ulcers,feet soreness and sweeling, has not taken her meds for many months because she doesn't have a car to get to pharmacy and no way to pay for them Insurance: [place] attempt # 1  to patient. No answer and unable to leave message. Noted in patient's chart she has two [place] POA agents Abigail Butts and Jenny Reichmann). Patient's HC POA([name]) contacted MD office to report patient was in SNF for 6-8wks. Called placed to [facility] POA to verify patient's status. No answer and RN CM left HIPAA compliant voicemail message along with contact info.       Plan: RN CM will make outreach attempt to pt/HCPOA within three business days if no return call.    Enzo Montgomery, RN,BSN,CCM [place] Management Telephonic Care Management Coordinator Direct Phone: [phone] Toll Free: [phone] Fax: [phone]

## 2017-04-21 ENCOUNTER — Ambulatory Visit: Payer: Self-pay

## 2017-04-22 ENCOUNTER — Ambulatory Visit: Payer: Medicare PPO | Admitting: Family Medicine

## 2017-04-24 ENCOUNTER — Encounter: Payer: Self-pay | Admitting: Licensed Clinical Social Worker

## 2017-04-24 ENCOUNTER — Other Ambulatory Visit: Payer: Self-pay | Admitting: Licensed Clinical Social Worker

## 2017-04-24 NOTE — Patient Outreach (Signed)
Assessment:  CSW received referral on Tina West. CSW completed chart review on client on 04/24/17.  Client sees Dr. Livia Snellen as primary care doctor.  Client resides with her son at home in [place], [place].  Client has [employer].  Client has [place] Tina West and Tina West).  Client has Alzheimer's dementia.  CSW called [facility] for client, Tina West, on 04/24/17 and spoke via phone with Tina West. CSW verified identity of Tina West. CSW received verbal permission from Tina West on 04/24/17 for CSW to speak with Tina West about client needs and status. Tina West said that client recently discharged from a nursing center and returned to home of client in the community. Client resides with her son, Tina West, in the commuinty. Tina West helps client with activities of daily living.  Tina West helps with meal preparation for client. Tina West said that client did receive some physical therapy sessions, as scheduled for client, while client was at nursing center.  Tina West said that client has a 3 in 1 bedside commode at home and has  a 2 wheeled walker at home.  CSW described to [facility] program services in pharmacy, social work and nursing.  CSW and Tina West completed needed [facility] client assessments.  CSW spoke with Tina West about transportation resources for client in [place], [place].  [place] spoke with Tina West about [place].  CSW descirbed to Tina West the normal costs for such transport assistance for qualifying clients in [place], [place].  [place] and Tina West spoke of client care plan. CSW encouraged that Tina West or client speak with CSW in next 30 days about transport resources for client in the area. CSW encouraged Tina West or client to call CSW at [phone] as needed to discuss social work needs of client. CSW thanked Tina West for phone call with CSW on 04/24/17.   Plan:  Tina West or client to communicate with CSW in  next 30 days to discuss transport resources for client in the area.  CSW to call Alena Blankenbeckler or client in 3 weeks to assess client needs at that time.  Norva Riffle.Bryelle Spiewak MSW, LCSW Licensed Clinical Social Worker [facility] Care Management [phone]

## 2017-04-26 ENCOUNTER — Encounter (HOSPITAL_COMMUNITY): Payer: Self-pay | Admitting: Emergency Medicine

## 2017-04-26 ENCOUNTER — Other Ambulatory Visit: Payer: Self-pay

## 2017-04-26 ENCOUNTER — Emergency Department (HOSPITAL_COMMUNITY): Payer: Medicare PPO

## 2017-04-26 ENCOUNTER — Inpatient Hospital Stay (HOSPITAL_COMMUNITY)
Admission: EM | Admit: 2017-04-26 | Discharge: 2017-05-15 | DRG: 640 | Disposition: E | Payer: Medicare PPO | Attending: Internal Medicine | Admitting: Internal Medicine

## 2017-04-26 DIAGNOSIS — L8990 Pressure ulcer of unspecified site, unspecified stage: Secondary | ICD-10-CM

## 2017-04-26 DIAGNOSIS — R7989 Other specified abnormal findings of blood chemistry: Secondary | ICD-10-CM

## 2017-04-26 DIAGNOSIS — F419 Anxiety disorder, unspecified: Secondary | ICD-10-CM | POA: Diagnosis present

## 2017-04-26 DIAGNOSIS — L89322 Pressure ulcer of left buttock, stage 2: Secondary | ICD-10-CM | POA: Diagnosis present

## 2017-04-26 DIAGNOSIS — Z1623 Resistance to quinolones and fluoroquinolones: Secondary | ICD-10-CM | POA: Diagnosis not present

## 2017-04-26 DIAGNOSIS — I509 Heart failure, unspecified: Secondary | ICD-10-CM

## 2017-04-26 DIAGNOSIS — Z7982 Long term (current) use of aspirin: Secondary | ICD-10-CM | POA: Diagnosis not present

## 2017-04-26 DIAGNOSIS — G308 Other Alzheimer's disease: Secondary | ICD-10-CM

## 2017-04-26 DIAGNOSIS — R627 Adult failure to thrive: Secondary | ICD-10-CM

## 2017-04-26 DIAGNOSIS — Z66 Do not resuscitate: Secondary | ICD-10-CM | POA: Diagnosis present

## 2017-04-26 DIAGNOSIS — B9620 Unspecified Escherichia coli [E. coli] as the cause of diseases classified elsewhere: Secondary | ICD-10-CM | POA: Diagnosis not present

## 2017-04-26 DIAGNOSIS — I110 Hypertensive heart disease with heart failure: Secondary | ICD-10-CM | POA: Diagnosis present

## 2017-04-26 DIAGNOSIS — I2510 Atherosclerotic heart disease of native coronary artery without angina pectoris: Secondary | ICD-10-CM | POA: Diagnosis present

## 2017-04-26 DIAGNOSIS — F0280 Dementia in other diseases classified elsewhere without behavioral disturbance: Secondary | ICD-10-CM | POA: Diagnosis present

## 2017-04-26 DIAGNOSIS — W19XXXA Unspecified fall, initial encounter: Secondary | ICD-10-CM | POA: Diagnosis present

## 2017-04-26 DIAGNOSIS — E860 Dehydration: Secondary | ICD-10-CM | POA: Diagnosis not present

## 2017-04-26 DIAGNOSIS — Z681 Body mass index (BMI) 19 or less, adult: Secondary | ICD-10-CM

## 2017-04-26 DIAGNOSIS — N390 Urinary tract infection, site not specified: Secondary | ICD-10-CM | POA: Diagnosis not present

## 2017-04-26 DIAGNOSIS — Z885 Allergy status to narcotic agent status: Secondary | ICD-10-CM

## 2017-04-26 DIAGNOSIS — R32 Unspecified urinary incontinence: Secondary | ICD-10-CM | POA: Diagnosis present

## 2017-04-26 DIAGNOSIS — E039 Hypothyroidism, unspecified: Secondary | ICD-10-CM | POA: Diagnosis present

## 2017-04-26 DIAGNOSIS — L89892 Pressure ulcer of other site, stage 2: Secondary | ICD-10-CM | POA: Diagnosis present

## 2017-04-26 DIAGNOSIS — E870 Hyperosmolality and hypernatremia: Secondary | ICD-10-CM | POA: Diagnosis not present

## 2017-04-26 DIAGNOSIS — R1310 Dysphagia, unspecified: Secondary | ICD-10-CM | POA: Diagnosis present

## 2017-04-26 DIAGNOSIS — R159 Full incontinence of feces: Secondary | ICD-10-CM | POA: Diagnosis present

## 2017-04-26 DIAGNOSIS — I5023 Acute on chronic systolic (congestive) heart failure: Secondary | ICD-10-CM

## 2017-04-26 DIAGNOSIS — L89151 Pressure ulcer of sacral region, stage 1: Secondary | ICD-10-CM | POA: Diagnosis not present

## 2017-04-26 DIAGNOSIS — E876 Hypokalemia: Secondary | ICD-10-CM | POA: Diagnosis present

## 2017-04-26 DIAGNOSIS — Y92009 Unspecified place in unspecified non-institutional (private) residence as the place of occurrence of the external cause: Secondary | ICD-10-CM | POA: Diagnosis not present

## 2017-04-26 DIAGNOSIS — E43 Unspecified severe protein-calorie malnutrition: Secondary | ICD-10-CM | POA: Diagnosis present

## 2017-04-26 DIAGNOSIS — G301 Alzheimer's disease with late onset: Secondary | ICD-10-CM | POA: Diagnosis not present

## 2017-04-26 DIAGNOSIS — Z79899 Other long term (current) drug therapy: Secondary | ICD-10-CM

## 2017-04-26 DIAGNOSIS — Z7189 Other specified counseling: Secondary | ICD-10-CM

## 2017-04-26 DIAGNOSIS — J90 Pleural effusion, not elsewhere classified: Secondary | ICD-10-CM

## 2017-04-26 DIAGNOSIS — Z7901 Long term (current) use of anticoagulants: Secondary | ICD-10-CM

## 2017-04-26 DIAGNOSIS — Z4659 Encounter for fitting and adjustment of other gastrointestinal appliance and device: Secondary | ICD-10-CM

## 2017-04-26 DIAGNOSIS — D473 Essential (hemorrhagic) thrombocythemia: Secondary | ICD-10-CM | POA: Diagnosis present

## 2017-04-26 DIAGNOSIS — Z91041 Radiographic dye allergy status: Secondary | ICD-10-CM

## 2017-04-26 DIAGNOSIS — E038 Other specified hypothyroidism: Secondary | ICD-10-CM | POA: Diagnosis present

## 2017-04-26 DIAGNOSIS — Z87891 Personal history of nicotine dependence: Secondary | ICD-10-CM

## 2017-04-26 DIAGNOSIS — R000 Tachycardia, unspecified: Secondary | ICD-10-CM | POA: Diagnosis not present

## 2017-04-26 DIAGNOSIS — G309 Alzheimer's disease, unspecified: Secondary | ICD-10-CM | POA: Diagnosis not present

## 2017-04-26 DIAGNOSIS — Z515 Encounter for palliative care: Secondary | ICD-10-CM | POA: Diagnosis not present

## 2017-04-26 DIAGNOSIS — L89312 Pressure ulcer of right buttock, stage 2: Secondary | ICD-10-CM | POA: Diagnosis present

## 2017-04-26 DIAGNOSIS — E871 Hypo-osmolality and hyponatremia: Secondary | ICD-10-CM | POA: Diagnosis not present

## 2017-04-26 DIAGNOSIS — I4891 Unspecified atrial fibrillation: Secondary | ICD-10-CM | POA: Diagnosis present

## 2017-04-26 DIAGNOSIS — R748 Abnormal levels of other serum enzymes: Secondary | ICD-10-CM | POA: Diagnosis not present

## 2017-04-26 DIAGNOSIS — Z79890 Hormone replacement therapy (postmenopausal): Secondary | ICD-10-CM

## 2017-04-26 DIAGNOSIS — I5022 Chronic systolic (congestive) heart failure: Secondary | ICD-10-CM | POA: Diagnosis not present

## 2017-04-26 DIAGNOSIS — R778 Other specified abnormalities of plasma proteins: Secondary | ICD-10-CM

## 2017-04-26 DIAGNOSIS — I5043 Acute on chronic combined systolic (congestive) and diastolic (congestive) heart failure: Secondary | ICD-10-CM | POA: Diagnosis present

## 2017-04-26 DIAGNOSIS — N179 Acute kidney failure, unspecified: Secondary | ICD-10-CM | POA: Diagnosis not present

## 2017-04-26 DIAGNOSIS — L89102 Pressure ulcer of unspecified part of back, stage 2: Secondary | ICD-10-CM | POA: Diagnosis present

## 2017-04-26 HISTORY — DX: Unspecified dementia, unspecified severity, without behavioral disturbance, psychotic disturbance, mood disturbance, and anxiety: F03.90

## 2017-04-26 LAB — COMPREHENSIVE METABOLIC PANEL
ALBUMIN: 3.3 g/dL — AB (ref 3.5–5.0)
ALK PHOS: 87 U/L (ref 38–126)
ALT: 32 U/L (ref 14–54)
AST: 32 U/L (ref 15–41)
Anion gap: 18 — ABNORMAL HIGH (ref 5–15)
BILIRUBIN TOTAL: 4.2 mg/dL — AB (ref 0.3–1.2)
BUN: 32 mg/dL — AB (ref 6–20)
CALCIUM: 9.4 mg/dL (ref 8.9–10.3)
CO2: 31 mmol/L (ref 22–32)
CREATININE: 0.88 mg/dL (ref 0.44–1.00)
Chloride: 92 mmol/L — ABNORMAL LOW (ref 101–111)
GFR calc Af Amer: >60 mL/min (ref >60)
GFR calc non Af Amer: 58 mL/min — ABNORMAL LOW (ref >60)
GLUCOSE: 110 mg/dL — AB (ref 65–99)
POTASSIUM: 2.4 mmol/L — AB (ref 3.5–5.1)
Sodium: 141 mmol/L (ref 135–145)
TOTAL PROTEIN: 6.8 g/dL (ref 6.5–8.1)

## 2017-04-26 LAB — CBC WITH DIFFERENTIAL/PLATELET
BASOS PCT: 0 %
Basophils Absolute: 0 10*3/uL (ref 0.0–0.1)
EOS PCT: 0 %
Eosinophils Absolute: 0 10*3/uL (ref 0.0–0.7)
HEMATOCRIT: 40.8 % (ref 36.0–46.0)
Hemoglobin: 12.2 g/dL (ref 12.0–15.0)
LYMPHS ABS: 0.8 10*3/uL (ref 0.7–4.0)
Lymphocytes Relative: 3 %
MCH: 25.1 pg — AB (ref 26.0–34.0)
MCHC: 29.9 g/dL — ABNORMAL LOW (ref 30.0–36.0)
MCV: 84 fL (ref 78.0–100.0)
MONO ABS: 0.8 10*3/uL (ref 0.1–1.0)
MONOS PCT: 3 %
NEUTROS ABS: 23.7 10*3/uL — AB (ref 1.7–7.7)
Neutrophils Relative %: 94 %
PLATELETS: 640 10*3/uL — AB (ref 150–400)
RBC: 4.86 MIL/uL (ref 3.87–5.11)
RDW: 16.8 % — AB (ref 11.5–15.5)
WBC: 25.3 10*3/uL — ABNORMAL HIGH (ref 4.0–10.5)

## 2017-04-26 LAB — TROPONIN I: TROPONIN I: 0.13 ng/mL — AB (ref <0.03)

## 2017-04-26 LAB — LACTIC ACID, PLASMA
Lactic Acid, Venous: 1.9 mmol/L (ref 0.5–1.9)
Lactic Acid, Venous: 1.2 mmol/L (ref 0.5–1.9)

## 2017-04-26 LAB — DIGOXIN LEVEL: Digoxin Level: 0.9 ng/mL (ref 0.8–2.0)

## 2017-04-26 LAB — MAGNESIUM: MAGNESIUM: 1.6 mg/dL — AB (ref 1.7–2.4)

## 2017-04-26 LAB — BRAIN NATRIURETIC PEPTIDE: B Natriuretic Peptide: 2443 pg/mL — ABNORMAL HIGH (ref 0.0–100.0)

## 2017-04-26 MED ORDER — ACETAMINOPHEN 650 MG RE SUPP
650.0000 mg | Freq: Four times a day (QID) | RECTAL | Status: DC | PRN
  Administered 2017-05-13: 650 mg via RECTAL
  Filled 2017-04-26: qty 1

## 2017-04-26 MED ORDER — SODIUM CHLORIDE 0.9 % IV SOLN: 250.0000 mL | INTRAVENOUS | Status: DC | PRN

## 2017-04-26 MED ORDER — ACETAMINOPHEN 325 MG PO TABS
650.0000 mg | ORAL_TABLET | Freq: Four times a day (QID) | ORAL | Status: DC | PRN
  Administered 2017-04-27 – 2017-05-10 (×7): 650 mg via ORAL
  Filled 2017-04-26 (×7): qty 2

## 2017-04-26 MED ORDER — POTASSIUM CHLORIDE 10 MEQ/100ML IV SOLN
10.0000 meq | INTRAVENOUS | Status: AC
  Administered 2017-04-26 – 2017-04-27 (×5): 10 meq via INTRAVENOUS
  Filled 2017-04-26 (×5): qty 100

## 2017-04-26 MED ORDER — ASPIRIN EC 81 MG PO TBEC
81.0000 mg | DELAYED_RELEASE_TABLET | Freq: Every day | ORAL | Status: DC
  Administered 2017-04-26 – 2017-05-03 (×8): 81 mg via ORAL
  Filled 2017-04-26 (×9): qty 1

## 2017-04-26 MED ORDER — SODIUM CHLORIDE 0.9 % IV SOLN
INTRAVENOUS | Status: DC
  Administered 2017-04-26: 13:00:00 via INTRAVENOUS

## 2017-04-26 MED ORDER — LEVOTHYROXINE SODIUM 75 MCG PO TABS
175.0000 ug | ORAL_TABLET | Freq: Every day | ORAL | Status: DC
  Administered 2017-04-27: 175 ug via ORAL
  Filled 2017-04-26: qty 1

## 2017-04-26 MED ORDER — SODIUM CHLORIDE 0.9% FLUSH
3.0000 mL | INTRAVENOUS | Status: DC | PRN
  Filled 2017-04-26: qty 3

## 2017-04-26 MED ORDER — SODIUM CHLORIDE 0.9 % IV SOLN
INTRAVENOUS | Status: AC
  Administered 2017-04-26 (×2): via INTRAVENOUS

## 2017-04-26 MED ORDER — FUROSEMIDE 10 MG/ML IJ SOLN
40.0000 mg | Freq: Once | INTRAMUSCULAR | Status: AC
  Administered 2017-04-26: 40 mg via INTRAVENOUS
  Filled 2017-04-26: qty 4

## 2017-04-26 MED ORDER — POTASSIUM CHLORIDE CRYS ER 20 MEQ PO TBCR
40.0000 meq | EXTENDED_RELEASE_TABLET | Freq: Once | ORAL | Status: AC
  Administered 2017-04-26: 40 meq via ORAL
  Filled 2017-04-26: qty 2

## 2017-04-26 MED ORDER — ONDANSETRON HCL 4 MG/2ML IJ SOLN
4.0000 mg | Freq: Four times a day (QID) | INTRAMUSCULAR | Status: DC | PRN
  Administered 2017-05-12 (×2): 4 mg via INTRAVENOUS
  Filled 2017-04-26 (×2): qty 2

## 2017-04-26 MED ORDER — DIGOXIN 125 MCG PO TABS
0.1250 mg | ORAL_TABLET | Freq: Every day | ORAL | Status: DC
  Administered 2017-04-26 – 2017-05-03 (×8): 0.125 mg via ORAL
  Filled 2017-04-26 (×9): qty 1

## 2017-04-26 MED ORDER — POTASSIUM CHLORIDE 10 MEQ/100ML IV SOLN
10.0000 meq | INTRAVENOUS | Status: DC
  Administered 2017-04-26: 10 meq via INTRAVENOUS
  Filled 2017-04-26: qty 100

## 2017-04-26 MED ORDER — LOSARTAN POTASSIUM 25 MG PO TABS
12.5000 mg | ORAL_TABLET | Freq: Every day | ORAL | Status: DC
  Administered 2017-04-26 – 2017-04-29 (×4): 12.5 mg via ORAL
  Filled 2017-04-26 (×4): qty 1

## 2017-04-26 MED ORDER — APIXABAN 2.5 MG PO TABS
2.5000 mg | ORAL_TABLET | Freq: Two times a day (BID) | ORAL | Status: DC
  Administered 2017-04-26 – 2017-05-04 (×15): 2.5 mg via ORAL
  Filled 2017-04-26 (×15): qty 1

## 2017-04-26 MED ORDER — SODIUM CHLORIDE 0.9% FLUSH
3.0000 mL | Freq: Two times a day (BID) | INTRAVENOUS | Status: DC
  Administered 2017-04-27 – 2017-05-06 (×12): 3 mL via INTRAVENOUS

## 2017-04-26 MED ORDER — DONEPEZIL HCL 5 MG PO TABS
5.0000 mg | ORAL_TABLET | Freq: Every day | ORAL | Status: DC
  Administered 2017-04-26 – 2017-05-04 (×8): 5 mg via ORAL
  Filled 2017-04-26 (×8): qty 1

## 2017-04-26 MED ORDER — ENSURE ENLIVE PO LIQD
237.0000 mL | Freq: Two times a day (BID) | ORAL | Status: DC
  Administered 2017-04-27 – 2017-05-09 (×14): 237 mL via ORAL

## 2017-04-26 MED ORDER — MAGNESIUM SULFATE 2 GM/50ML IV SOLN
2.0000 g | Freq: Once | INTRAVENOUS | Status: AC
  Administered 2017-04-26: 2 g via INTRAVENOUS
  Filled 2017-04-26: qty 50

## 2017-04-26 MED ORDER — SODIUM CHLORIDE 0.9 % IV BOLUS (SEPSIS)
250.0000 mL | Freq: Once | INTRAVENOUS | Status: AC
  Administered 2017-04-26: 250 mL via INTRAVENOUS

## 2017-04-26 MED ORDER — ASPIRIN 81 MG PO TBEC: 81.0000 mg | DELAYED_RELEASE_TABLET | Freq: Every day | ORAL | Status: DC

## 2017-04-26 MED ORDER — ATORVASTATIN CALCIUM 20 MG PO TABS
20.0000 mg | ORAL_TABLET | Freq: Every day | ORAL | Status: DC
  Administered 2017-04-26 – 2017-05-03 (×6): 20 mg via ORAL
  Filled 2017-04-26 (×6): qty 1

## 2017-04-26 MED ORDER — APIXABAN 5 MG PO TABS: 5.0000 mg | ORAL_TABLET | Freq: Two times a day (BID) | ORAL | Status: DC

## 2017-04-26 MED ORDER — POTASSIUM CHLORIDE 10 MEQ/100ML IV SOLN
10.0000 meq | INTRAVENOUS | Status: AC
Start: 2017-04-26 — End: 2017-04-26
  Administered 2017-04-26 (×2): 10 meq via INTRAVENOUS
  Filled 2017-04-26 (×2): qty 100

## 2017-04-26 MED ORDER — ADULT MULTIVITAMIN W/MINERALS CH
1.0000 | ORAL_TABLET | Freq: Every day | ORAL | Status: DC
  Administered 2017-04-26 – 2017-05-07 (×9): 1 via ORAL
  Filled 2017-04-26 (×10): qty 1

## 2017-04-26 MED ORDER — ONDANSETRON HCL 4 MG PO TABS
4.0000 mg | ORAL_TABLET | Freq: Four times a day (QID) | ORAL | Status: DC | PRN
  Administered 2017-04-27: 4 mg via ORAL
  Filled 2017-04-26: qty 1

## 2017-04-26 MED ORDER — METOPROLOL TARTRATE 50 MG PO TABS
50.0000 mg | ORAL_TABLET | Freq: Two times a day (BID) | ORAL | Status: DC
  Administered 2017-04-26 – 2017-05-04 (×15): 50 mg via ORAL
  Filled 2017-04-26 (×17): qty 1

## 2017-04-26 MED ORDER — ACETAMINOPHEN 325 MG PO TABS: 650.0000 mg | ORAL_TABLET | Freq: Four times a day (QID) | ORAL | Status: DC | PRN

## 2017-04-26 MED ORDER — SENNOSIDES-DOCUSATE SODIUM 8.6-50 MG PO TABS: 1.0000 | ORAL_TABLET | Freq: Every evening | ORAL | Status: DC | PRN

## 2017-04-26 NOTE — Progress Notes (Signed)
Alert and oriented to name and location and birthday.  Able to answer questions and then follow commands.  Swallowed pills with no difficulty and seemed hungry when offered food.  Drank two ensures, two cups of water and ate nabs and jello.  Had pressure ulcers on buttock and lumbar spine as well as dorsal surface of 4 toes which are all documented.  Rash to buttocks and lower back as well.  Daughter in law called and she is patient's healthcare poa.  Patient lives with son. Daugther in law said that they have recently tried to involve [facility] for assistance as well as another agency.  States that patient has money to pay for nursing home which she was in at one time but diid not want to pay and she has no poa or legal guardian.

## 2017-04-26 NOTE — ED Provider Notes (Signed)
[facility] EMERGENCY DEPARTMENT Provider Note   CSN: [id number] Arrival date & time: 05/05/2017  0945     History   Chief Complaint Chief Complaint  Patient presents with  . Failure To Thrive    HPI Tina West is a 84 y.o. female.  The history is provided by the EMS personnel and the patient. The history is limited by the condition of the patient (Hx dementia).  Pt was seen at 1000. Per EMS: Call to EMS was for pt fall at home. EMS found pt at home: "filthy" living conditions, pt was laying on a mattress on the floor with a shower curtain on top of it, pt was covered with feces and urine. Pt told ED RN that she lives with her son who is her caregiver.   Past Medical History:  Diagnosis Date  . A-fib ([place])   . Abdominal cyst    Multiple surgeries in [place]. "Heart Stopped" on the operating table during one of her "cyst" operations, probably 15-20 years ago  . Anxiety   . Dementia   . Diastolic CHF, acute (HCC)    BNP 179 in 1/11. Echo (1/11) with EF 60%, mild focal basal septal hypertrophy, grade I diastolic dysfunction, mild left atrial enlargement.   [place] HTN (hypertension)   . Hypothyroidism   . Obesity   . PAD (peripheral artery disease) ([place]) 03/16/2017  . S/P TAH (total abdominal hysterectomy)     Patient Active Problem List   Diagnosis Date Noted  . PAD (peripheral artery disease) ([place]) 03/16/2017  . Acute systolic CHF (congestive heart failure) ([place]) 03/14/2017  . Demand ischemia ([place]) 03/13/2017  . Acute on chronic diastolic (congestive) heart failure ([place]) 03/13/2017  . Thrombocytosis ([place]) 03/13/2017  . Protein-calorie malnutrition, severe 03/13/2017  . A-fib ([place]) 03/12/2017  . Left leg cellulitis 03/12/2017  . Hypothyroidism 03/12/2017  . Other specified hypothyroidism 01/12/2015  . Alzheimer's dementia 01/12/2015  . CARDIOMYOPATHY, ALCOHOLIC 19/50/9326  . Essential hypertension 04/05/2009  . PERSONAL HISTORY OF SUDDEN CARDIAC ARREST 04/05/2009     Past Surgical History:  Procedure Laterality Date  . ABDOMINAL HYSTERECTOMY    . CHOLECYSTECTOMY    . HERNIA REPAIR    . RIGHT/LEFT HEART CATH AND CORONARY ANGIOGRAPHY N/A 03/18/2017   Procedure: RIGHT/LEFT HEART CATH AND CORONARY ANGIOGRAPHY;  Surgeon: Jettie Booze, MD;  Location: [facility];  Service: Cardiovascular;  Laterality: N/A;    OB History    No data available       Home Medications    Prior to Admission medications   Medication Sig Start Date End Date Taking? Authorizing Provider  acetaminophen (TYLENOL) 325 MG tablet Take 650 mg by mouth every 6 (six) hours as needed for mild pain.    [provider]  ALPRAZolam Duanne Moron) 0.25 MG tablet Take 1 tablet (0.25 mg total) by mouth 2 (two) times daily. Patient taking differently: Take 0.5 mg by mouth daily as needed for anxiety.  07/14/14   Claretta Fraise, MD  apixaban (ELIQUIS) 5 MG TABS tablet Take 1 tablet (5 mg total) by mouth 2 (two) times daily. 03/27/17   Alma Friendly, MD  aspirin (ASPIR-LOW) 81 MG EC tablet Take 81 mg by mouth daily.      [provider]  atorvastatin (LIPITOR) 20 MG tablet Take 1 tablet (20 mg total) by mouth daily at 6 PM. 03/27/17   Horris Latino, Adline Peals, MD  collagenase (SANTYL) ointment Apply topically daily. 03/28/17   Alma Friendly, MD  digoxin (LANOXIN) 0.125 MG tablet Take 1 tablet (0.125 mg total) by mouth daily. 03/28/17   Alma Friendly, MD  DM-Doxylamine-Acetaminophen (NYQUIL COLD & FLU PO) Take 15 mLs by mouth at bedtime as needed (cough).    [provider]  donepezil (ARICEPT) 10 MG tablet Take 0.5 tablets (5 mg total) by mouth at bedtime. Patient not taking: Reported on 03/12/2017 11/10/14   Claretta Fraise, MD  furosemide (LASIX) 40 MG tablet Take 1 tablet (40 mg total) by mouth daily. 03/27/17   Alma Friendly, MD  ibuprofen (ADVIL,MOTRIN) 200 MG tablet Take 200 mg by mouth every 6 (six) hours as needed for moderate pain.     [provider]  levothyroxine (SYNTHROID, LEVOTHROID) 150 MCG tablet Take 1 tablet (150 mcg total) by mouth daily before breakfast. 03/28/17   Alma Friendly, MD  losartan (COZAAR) 25 MG tablet Take 0.5 tablets (12.5 mg total) by mouth daily. 03/28/17   Alma Friendly, MD  metoprolol tartrate (LOPRESSOR) 50 MG tablet Take 1 tablet (50 mg total) by mouth 2 (two) times daily. 03/27/17   Alma Friendly, MD  Multiple Vitamin (MULTIVITAMIN WITH MINERALS) TABS tablet Take 1 tablet by mouth daily. 03/28/17   Alma Friendly, MD  nystatin (MYCOSTATIN/NYSTOP) powder Apply topically 3 (three) times daily. 03/27/17   Alma Friendly, MD    Family History Family History  Problem Relation Age of Onset  . Heart disease Father        Died of "heart attack" at age 35  . CAD Son        Stents.  Currently age 80  . Brain cancer Mother   . Hypertension Brother   . Brain cancer Brother   . Ulcers Son        bleeding ulcers  . Alzheimer's disease Brother        presumed to have died of heart attack  . Stomach cancer Daughter        died in her 77's    Social History Social History   Tobacco Use  . Smoking status: Former Research scientist (life sciences)  . Smokeless tobacco: Never Used  . Tobacco comment: quit smoking at age 68  Substance Use Topics  . Alcohol use: No    Frequency: Never  . Drug use: No     Allergies   Codeine; Contrast media [iodinated diagnostic agents]; Diazepam; Meperidine hcl; and Morphine   Review of Systems Review of Systems  Unable to perform ROS: Dementia     Physical Exam Updated Vital Signs BP 136/77 (BP Location: Right Arm)   Pulse 75   Temp 99.5 F (37.5 C) (Rectal)   Resp 18   Ht 5\' 9"  (1.753 m)   Wt 68 kg (150 lb)   SpO2 100%   BMI 22.15 kg/m   Physical Exam 1005: Physical examination:  Nursing notes reviewed; Vital signs and O2 SAT reviewed;  Constitutional: Disheveled. In no acute distress; Head:  Normocephalic, atraumatic; Eyes:  EOMI, PERRL, No scleral icterus; ENMT: Mouth and pharynx normal, Mucous membranes dry; Neck: Supple, Full range of motion, No lymphadenopathy; Cardiovascular: Regular rate and rhythm, No gallop; Respiratory: Breath sounds coarse & equal bilaterally, No wheezes.  Speaking sentences, +frequent moist weak cough during exam. Normal respiratory effort/excursion; Chest: Nontender, Movement normal; Abdomen: Soft, Nontender, Nondistended, Normal bowel sounds; Genitourinary: No CVA tenderness; Extremities: Pulses normal, No tenderness, No edema, No calf edema or asymmetry.; Neuro: Awake, alert, confused per hx dementia. No facial droop. Speech clear.  Moves all extremities on stretcher spontaneously and to command without apparent gross focal motor deficits.; Skin: Color normal, Warm, Dry.   ED Treatments / Results  Labs (all labs ordered are listed, but only abnormal results are displayed)   EKG  EKG Interpretation  Date/Time:  Sunday April 26 2017 11:05:22 EST Ventricular Rate:  97 PR Interval:    QRS Duration: 107 QT Interval:  368 QTC Calculation: 451 R Axis:   -7 Text Interpretation:  Atrial fibrillation Paired ventricular premature complexes Probable LVH with secondary repol abnrm Nonspecific ST and T wave abnormality When compared with ECG of 03/21/2017 Left bundle branch block is no longer present Nonspecific ST and T wave abnormality is now Present Confirmed by Francine Graven [phone]) on 04/23/2017 11:12:33 AM       Radiology   Procedures Procedures (including critical care time)  Medications Ordered in ED Medications - No data to display   Initial Impression / Assessment and Plan / ED Course  I have reviewed the triage vital signs and the nursing notes.  Pertinent labs & imaging results that were available during my care of the patient were reviewed by me and considered in my medical decision making (see chart for details).  MDM Reviewed: previous chart, nursing note and  vitals Reviewed previous: labs and ECG Interpretation: labs, ECG, x-ray and CT scan Total time providing critical care: 30-74 minutes. This excludes time spent performing separately reportable procedures and services. Consults: admitting MD   CRITICAL CARE Performed by: Alfonzo Feller Total critical care time: 35 minutes Critical care time was exclusive of separately billable procedures and treating other patients. Critical care was necessary to treat or prevent imminent or life-threatening deterioration. Critical care was time spent personally by me on the following activities: development of treatment plan with patient and/or surrogate as well as nursing, discussions with consultants, evaluation of patient's response to treatment, examination of patient, obtaining history from patient or surrogate, ordering and performing treatments and interventions, ordering and review of laboratory studies, ordering and review of radiographic studies, pulse oximetry and re-evaluation of patient's condition.   Results for orders placed or performed during the hospital encounter of 04/17/2017  Comprehensive metabolic panel  Result Value Ref Range   Sodium 141 135 - 145 mmol/L   Potassium 2.4 (LL) 3.5 - 5.1 mmol/L   Chloride 92 (L) 101 - 111 mmol/L   CO2 31 22 - 32 mmol/L   Glucose, Bld 110 (H) 65 - 99 mg/dL   BUN 32 (H) 6 - 20 mg/dL   Creatinine, Ser 0.88 0.44 - 1.00 mg/dL   Calcium 9.4 8.9 - 10.3 mg/dL   Total Protein 6.8 6.5 - 8.1 g/dL   Albumin 3.3 (L) 3.5 - 5.0 g/dL   AST 32 15 - 41 U/L   ALT 32 14 - 54 U/L   Alkaline Phosphatase 87 38 - 126 U/L   Total Bilirubin 4.2 (H) 0.3 - 1.2 mg/dL   GFR calc non Af Amer 58 (L) >60 mL/min   GFR calc Af Amer >60 >60 mL/min   Anion gap 18 (H) 5 - 15  Troponin I  Result Value Ref Range   Troponin I 0.13 (HH) <0.03 ng/mL  Lactic acid, plasma  Result Value Ref Range   Lactic Acid, Venous 1.9 0.5 - 1.9 mmol/L  CBC with Differential  Result Value Ref  Range   WBC 25.3 (H) 4.0 - 10.5 K/uL   RBC 4.86 3.87 - 5.11 MIL/uL   Hemoglobin 12.2 12.0 -  15.0 g/dL   HCT 40.8 36.0 - 46.0 %   MCV 84.0 78.0 - 100.0 fL   MCH 25.1 (L) 26.0 - 34.0 pg   MCHC 29.9 (L) 30.0 - 36.0 g/dL   RDW 16.8 (H) 11.5 - 15.5 %   Platelets 640 (H) 150 - 400 K/uL   Neutrophils Relative % 94 %   Lymphocytes Relative 3 %   Monocytes Relative 3 %   Eosinophils Relative 0 %   Basophils Relative 0 %   Neutro Abs 23.7 (H) 1.7 - 7.7 K/uL   Lymphs Abs 0.8 0.7 - 4.0 K/uL   Monocytes Absolute 0.8 0.1 - 1.0 K/uL   Eosinophils Absolute 0.0 0.0 - 0.7 K/uL   Basophils Absolute 0.0 0.0 - 0.1 K/uL   WBC Morphology WHITE COUNT CONFIRMED ON SMEAR   Brain natriuretic peptide  Result Value Ref Range   B Natriuretic Peptide 2,443.0 (H) 0.0 - 100.0 pg/mL  Digoxin level  Result Value Ref Range   Digoxin Level 0.9 0.8 - 2.0 ng/mL  Magnesium  Result Value Ref Range   Magnesium 1.6 (L) 1.7 - 2.4 mg/dL   Dg Chest 2 View Result Date: 04/30/2017 CLINICAL DATA:  weakness EXAM: CHEST  2 VIEW COMPARISON:  03/19/2017 FINDINGS: Normal cardiac silhouette. Dextroscoliosis of the thoracic spine. Lungs are clear. No pneumothorax. Small LEFT effusion is noted on lateral radiograph. IMPRESSION: 1. Small LEFT effusion. 2. Dextroscoliosis of thoracic spine Electronically Signed   By: Suzy Bouchard M.D.   On: 04/18/2017 12:11   Ct Head Wo Contrast Result Date: 04/23/2017 CLINICAL DATA:  84 year old female with fall and head injury. EXAM: CT HEAD WITHOUT CONTRAST TECHNIQUE: Contiguous axial images were obtained from the base of the skull through the vertex without intravenous contrast. COMPARISON:  None. FINDINGS: Brain: No evidence of acute infarction, hemorrhage, hydrocephalus, extra-axial collection or mass lesion/mass effect. Atrophy, chronic small-vessel white matter ischemic changes and remote right basal ganglia infarct noted. Vascular: Atherosclerotic calcifications noted. Skull: Normal. Negative  for fracture or focal lesion. Sinuses/Orbits: No acute finding. Other: None. IMPRESSION: 1. No evidence of acute intracranial abnormality 2. Atrophy, chronic small-vessel white matter ischemic changes and remote right basal ganglia infarct. Electronically Signed   By: Margarette Canada M.D.   On: 05/08/2017 12:04    1310:  Pt unable to stand for orthostatics, apparently very weak with just sitting also. No urine in bladder; judicious IVF given. BNP elevated with pleural effusion on CXR; IV lasix given. Troponin chronically elevated. Potassium and magnesium repleted IV; potassium also given PO.  APS report made by ED Charge RN (see separate note).  T/C to [facility] Dr. Jerilee Hoh, case discussed, including:  HPI, pertinent PM/SHx, VS/PE, dx testing, ED course and treatment:  Agreeable to admit.     Final Clinical Impressions(s) / ED Diagnoses   Final diagnoses:  None    ED Discharge Orders    None        Francine Graven, DO 04/27/17 0809

## 2017-04-26 NOTE — ED Notes (Signed)
Date and time results received: 05/12/2017 11:12 AM (use smartphrase ".now" to insert current time)  Test: K Critical Value: 2.4  Test: Troponin Critical Value: 0.13  Name of Provider Notified: Thurnell Garbe  Orders Received? Or Actions Taken?: Orders Received - See Orders for details

## 2017-04-26 NOTE — ED Notes (Signed)
Patient unable to stand or sit up.

## 2017-04-26 NOTE — ED Notes (Signed)
Patient is unable to stand for orthostatic vital signs.

## 2017-04-26 NOTE — ED Notes (Addendum)
APS report made to [place] based on EMS report and condition of the patient.  Spoke with Abigail Butts [facility]) who acknowledged the pt was lying on a mattress on the floor with a shower curtain on it because she is incontinent and the son cannot change her adequately.  States she has tried to get medical equipment without success due to pt's finances.  States pt has money to pay for things, but refuses.  Abigail Butts is only HCPOA, not financial.  States the son keeps turning the heat off in the home and she cannot force him to keep it on.

## 2017-04-26 NOTE — ED Triage Notes (Signed)
Pt brought in by [facility] EMS for report of a fall at home.  When ems arrived, they found patient lying on a shower curtain which has been placed on her mattress.  Son lives with her and is supposed to take care of her as well as her POA.  Pt states she has not seen her POA since she was brought [facility].  Ems reports house was filthy and pt is lying on a mattress on the floor.
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
## 2017-04-27 ENCOUNTER — Other Ambulatory Visit: Payer: Self-pay | Admitting: *Deleted

## 2017-04-27 DIAGNOSIS — Z515 Encounter for palliative care: Secondary | ICD-10-CM

## 2017-04-27 DIAGNOSIS — E860 Dehydration: Secondary | ICD-10-CM

## 2017-04-27 DIAGNOSIS — F0280 Dementia in other diseases classified elsewhere without behavioral disturbance: Secondary | ICD-10-CM

## 2017-04-27 DIAGNOSIS — G301 Alzheimer's disease with late onset: Secondary | ICD-10-CM

## 2017-04-27 LAB — CBC
HEMATOCRIT: 35 % — AB (ref 36.0–46.0)
Hemoglobin: 10.4 g/dL — ABNORMAL LOW (ref 12.0–15.0)
MCH: 24.8 pg — ABNORMAL LOW (ref 26.0–34.0)
MCHC: 29.7 g/dL — ABNORMAL LOW (ref 30.0–36.0)
MCV: 83.3 fL (ref 78.0–100.0)
PLATELETS: 511 10*3/uL — AB (ref 150–400)
RBC: 4.2 MIL/uL (ref 3.87–5.11)
RDW: 17 % — ABNORMAL HIGH (ref 11.5–15.5)
WBC: 24.2 10*3/uL — AB (ref 4.0–10.5)

## 2017-04-27 LAB — BASIC METABOLIC PANEL
Anion gap: 12 (ref 5–15)
BUN: 44 mg/dL — ABNORMAL HIGH (ref 6–20)
CHLORIDE: 93 mmol/L — AB (ref 101–111)
CO2: 29 mmol/L (ref 22–32)
Calcium: 8.5 mg/dL — ABNORMAL LOW (ref 8.9–10.3)
Creatinine, Ser: 1.02 mg/dL — ABNORMAL HIGH (ref 0.44–1.00)
GFR calc non Af Amer: 48 mL/min — ABNORMAL LOW (ref >60)
GFR, EST AFRICAN AMERICAN: 56 mL/min — AB (ref >60)
Glucose, Bld: 136 mg/dL — ABNORMAL HIGH (ref 65–99)
POTASSIUM: 3 mmol/L — AB (ref 3.5–5.1)
SODIUM: 134 mmol/L — AB (ref 135–145)

## 2017-04-27 LAB — TSH: TSH: 0.06 u[IU]/mL — ABNORMAL LOW (ref 0.350–4.500)

## 2017-04-27 MED ORDER — POTASSIUM CHLORIDE CRYS ER 20 MEQ PO TBCR
40.0000 meq | EXTENDED_RELEASE_TABLET | Freq: Once | ORAL | Status: AC
  Administered 2017-04-27: 40 meq via ORAL
  Filled 2017-04-27: qty 2

## 2017-04-27 MED ORDER — MAGIC MOUTHWASH W/LIDOCAINE: 10.0000 mL | Freq: Four times a day (QID) | ORAL | Status: DC

## 2017-04-27 MED ORDER — LIDOCAINE VISCOUS 2 % MT SOLN
5.0000 mL | Freq: Four times a day (QID) | OROMUCOSAL | Status: DC
Start: 2017-04-27 — End: 2017-04-29
  Administered 2017-04-27: 5 mL via OROMUCOSAL
  Filled 2017-04-27 (×4): qty 15

## 2017-04-27 MED ORDER — LEVOTHYROXINE SODIUM 75 MCG PO TABS
150.0000 ug | ORAL_TABLET | Freq: Every day | ORAL | Status: DC
  Administered 2017-04-28 – 2017-05-03 (×6): 150 ug via ORAL
  Filled 2017-04-27 (×6): qty 2

## 2017-04-27 MED ORDER — MAGIC MOUTHWASH
5.0000 mL | Freq: Four times a day (QID) | ORAL | Status: DC
  Administered 2017-04-27 – 2017-04-29 (×8): 5 mL via ORAL
  Filled 2017-04-27 (×8): qty 5

## 2017-04-27 MED ORDER — MAGNESIUM SULFATE 2 GM/50ML IV SOLN
2.0000 g | Freq: Once | INTRAVENOUS | Status: AC
  Administered 2017-04-27: 2 g via INTRAVENOUS
  Filled 2017-04-27: qty 50
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
## 2017-04-27 NOTE — Consult Note (Signed)
Consultation Note Date: 04/27/2017   Patient Name: Tina West  DOB: 02-Feb-1929  MRN: [id number]  Age / Sex: 84 y.o., female  PCP: Claretta Fraise, MD Referring Physician: Isaac Bliss, Olam Idler*  Reason for Consultation: Establishing goals of care  HPI/Patient Profile: 84 y.o. female  with past medical history of dementia, mixed HF with an EF of 15-20%, PAD, obesity, and anxiety who was admitted on 04/27/2017 after a fall at home.  She was reported to be found on a mattress on the floor incontinent of urine and feces.  Admission work up showed a WBC of 25 and BNP of 2443.  Clinically she appeared volume depleted.    Of note, the patient has a current weight of 117 lbs on 2/10.  Her weight has ranged from 148 - 160 over the last several months. She has ulcerations on her buttocks, lumbar spine, and toes. She was admitted with failure to thrive.    She has an Scientist, physiological in [place] notarized 03/16/17.  She has listed Sallye Lat as her HCPOA and Andi Layfield as the back up HCPOA.  The patient elected no life support if she was near end of life in any of the three situations listed on the Advanced Directive.  It was also indicated that she would not want a feeding tube.  Clinical Assessment and Goals of Care:  I have reviewed medical records including EPIC notes, labs and imaging, received report from the care team, assessed the patient and attempted to call family to discuss diagnosis prognosis, GOC, EOL wishes, disposition and options.  The patient is calm on exam. She tells me that her throat is sore.  She states I'm doing the best I can.  She is demented but tells me she lives with her son Broadus John in a trailer.  Her responses ramble off onto other unrelated topics.  Patient is pocketing food in both cheeks.    Primary Decision Maker:  Rachel Moulds daughter in law.    SUMMARY OF  RECOMMENDATIONS    PMT will continue to reach out to family to attempt to schedule a time for discussion of [place].  Code Status/Advance Care Planning:  Full code.  However advanced directives indicated no life support.   Symptom Management:   Continue current care.  Would wound care consult be of benefit?  Will change to D3 heart healthy diet.  Don't know if she's had a recent speech eval but that may be helpful for best texture of food.  Will request strict I &O to determine if she is taking in enough PO nutrition to support herself.  Magic mouthwash for throat pain  Palliative Prophylaxis:   Delirium Protocol  Psycho-social/Spiritual:   Desire for further Chaplaincy support: yes  Prognosis:   Difficult to assess.  She appears unable to walk with multiple wounds.  Dramatic weight loss.  Moderately advanced dementia.  CHF with an EF of 15-20%.  Likely has less than 6 months pending her GOC.    Discharge Planning: To Be  Determined  Likely SNF with Palliative.      Primary Diagnoses: Present on Admission: . FTT (failure to thrive) in adult . Other specified hypothyroidism . Alzheimer's dementia . Hypothyroidism   I have reviewed the medical record, interviewed the patient and family, and examined the patient. The following aspects are pertinent.  Past Medical History:  Diagnosis Date  . A-fib ([place])   . Abdominal cyst    Multiple surgeries in [place]. "Heart Stopped" on the operating table during one of her "cyst" operations, probably 15-20 years ago  . Anxiety   . Dementia   . Diastolic CHF, acute (HCC)    BNP 179 in 1/11. Echo (1/11) with EF 60%, mild focal basal septal hypertrophy, grade I diastolic dysfunction, mild left atrial enlargement.   Marland Kitchen HTN (hypertension)   . Hypothyroidism   . Obesity   . PAD (peripheral artery disease) ([place]) 03/16/2017  . S/P TAH (total abdominal hysterectomy)    Social History   Socioeconomic History  . Marital status:  Widowed    Spouse name: None  . Number of children: 1  . Years of education: None  . Highest education level: None  Social Needs  . Financial resource strain: None  . Food insecurity - worry: None  . Food insecurity - inability: None  . Transportation needs - medical: None  . Transportation needs - non-medical: None  Occupational History  . None  Tobacco Use  . Smoking status: Former Research scientist (life sciences)  . Smokeless tobacco: Never Used  . Tobacco comment: quit smoking at age 29  Substance and Sexual Activity  . Alcohol use: No    Frequency: Never  . Drug use: No  . Sexual activity: None  Other Topics Concern  . None  Social History Narrative   Originally from [place], now lives in [place]. She lives with her son. Her grandson and his wife live nearby.   Family History  Problem Relation Age of Onset  . Heart disease Father        Died of "heart attack" at age 28  . CAD Son        Stents.  Currently age 45  . Brain cancer Mother   . Hypertension Brother   . Brain cancer Brother   . Ulcers Son        bleeding ulcers  . Alzheimer's disease Brother        presumed to have died of heart attack  . Stomach cancer Daughter        died in her 46's   Scheduled Meds: . apixaban  2.5 mg Oral BID  . aspirin EC  81 mg Oral Daily  . atorvastatin  20 mg Oral q1800  . digoxin  0.125 mg Oral Daily  . donepezil  5 mg Oral QHS  . feeding supplement (ENSURE ENLIVE)  237 mL Oral BID BM  . levothyroxine  175 mcg Oral QAC breakfast  . losartan  12.5 mg Oral Daily  . metoprolol tartrate  50 mg Oral BID  . multivitamin with minerals  1 tablet Oral Daily  . sodium chloride flush  3 mL Intravenous Q12H   Continuous Infusions: . sodium chloride     PRN Meds:.sodium chloride, acetaminophen **OR** acetaminophen, ondansetron **OR** ondansetron (ZOFRAN) IV, senna-docusate, sodium chloride flush Allergies  Allergen Reactions  . Codeine Hives  . Contrast Media [Iodinated Diagnostic Agents] Other  (See Comments)    unspecified  . Diazepam Hives  . Meperidine Hcl Other (See Comments)    unspecified  .  Morphine Other (See Comments)    "caused heart attack"   Review of Systems demented.  Complains of sore throat pain.  Physical Exam elderly female demented, attempted CV irreg irreg Resp coarse sounds particularly on the left. Abdomen soft, nt, nd   Vital Signs: BP (!) 93/56 (BP Location: Left Arm)   Pulse 91   Temp 98.2 F (36.8 C) (Oral)   Resp 20   Ht 5\' 9"  (1.753 m)   Wt 53.5 kg (117 lb 15.1 oz)   SpO2 96%   BMI 17.42 kg/m  Pain Assessment: No/denies pain   Pain Score: 0-No pain   SpO2: SpO2: 96 % O2 Device:SpO2: 96 % O2 Flow Rate: .O2 Flow Rate (L/min): 2 L/min  IO: Intake/output summary:   Intake/Output Summary (Last 24 hours) at 04/27/2017 0920 Last data filed at 04/27/2017 0800 Gross per 24 hour  Intake 1996.25 ml  Output -  Net 1996.25 ml    LBM:   Baseline Weight: Weight: 68 kg (150 lb) Most recent weight: Weight: 53.5 kg (117 lb 15.1 oz)     Palliative Assessment/Data: 20%     Time In: 2:00 Time Out: 2:50 Time Total: 50 min. Greater than 50%  of this time was spent counseling and coordinating care related to the above assessment and plan.  Signed by: Florentina Jenny, PA-C Palliative Medicine Pager: [phone]  Please contact Palliative Medicine Team phone at [phone] for questions and concerns.  For individual provider: See Shea Evans

## 2017-04-28 ENCOUNTER — Encounter: Payer: Self-pay | Admitting: Family Medicine

## 2017-04-28 LAB — BASIC METABOLIC PANEL
Anion gap: 14 (ref 5–15)
BUN: 66 mg/dL — AB (ref 6–20)
CHLORIDE: 88 mmol/L — AB (ref 101–111)
CO2: 27 mmol/L (ref 22–32)
CREATININE: 1.51 mg/dL — AB (ref 0.44–1.00)
Calcium: 8.8 mg/dL — ABNORMAL LOW (ref 8.9–10.3)
GFR calc Af Amer: 35 mL/min — ABNORMAL LOW (ref >60)
GFR calc non Af Amer: 30 mL/min — ABNORMAL LOW (ref >60)
GLUCOSE: 132 mg/dL — AB (ref 65–99)
Potassium: 3.8 mmol/L (ref 3.5–5.1)
SODIUM: 129 mmol/L — AB (ref 135–145)

## 2017-04-28 LAB — CBC
HCT: 37.2 % (ref 36.0–46.0)
Hemoglobin: 11.2 g/dL — ABNORMAL LOW (ref 12.0–15.0)
MCH: 24.8 pg — AB (ref 26.0–34.0)
MCHC: 30.1 g/dL (ref 30.0–36.0)
MCV: 82.5 fL (ref 78.0–100.0)
PLATELETS: 456 10*3/uL — AB (ref 150–400)
RBC: 4.51 MIL/uL (ref 3.87–5.11)
RDW: 16.9 % — AB (ref 11.5–15.5)
WBC: 22.4 10*3/uL — ABNORMAL HIGH (ref 4.0–10.5)

## 2017-04-28 LAB — MAGNESIUM: Magnesium: 2.7 mg/dL — ABNORMAL HIGH (ref 1.7–2.4)

## 2017-04-28 NOTE — Progress Notes (Addendum)
Daily Progress Note   Patient Name: Tina West       Date: 04/28/2017 DOB: [birth year]  Age: 84 y.o. MRN#: [id number] Attending Physician: Isaac Bliss, [place] Primary Care Physician: Claretta Fraise, MD Admit Date: 04/25/2017  Reason for Consultation/Follow-up: Establishing goals of care  Subjective: I had a telephone conversation with Sallye Lat.  Abigail Butts is the grand daughter in law of the patient.  Per Abigail Butts the patient has two sons.  Linwood lives in [place] and did not want to be HCPOA.  Broadus John (who the patient lives with) has cognitive deficits and would not be able to understand information well enough to be HCPOA.  [name]'s husband (the grandson) is a long Associate Professor and unable to be [employer].  Since Abigail Butts has a medical background it made sense for her to be the [place].    Beatryce lived at [facility] and was happy there.  She left there when insurance stopped paying.  Per Charlton Amor has the money but refused to spend it on staying at the [facility].  Abigail Butts checks on Broadus John and Deshia weekly but the two of them limit Abigail Butts in what they will allow her to do.  For ex.  the patient has refused to sleep in a bed off the floor.    Further Abigail Butts works full time and attends school in addition to tending to her own immediate family.  Her time to care for Eavan is limited.  Abigail Butts tells me that Broadus John and Nahima live in their own little world.  Whatever is happening on TV they believe is happening to them.  So when I explain to Abigail Butts that the patient thought Trump visited her last night and that she is related to the [place] of [place] tells me that is normal for the patient and her son.  We discussed the need for a competency evaluation and possible guardianship.  Abigail Butts is  fine with all of that.  Abigail Butts expresses concern that both Joseph's disability check and Jerriann's disability check are deposited into Shakeema's checking account that Joseph's name is not on.  Abigail Butts is concerned that a guardian they will take over Joseph's money inappropriately.  She's asks about obtaining financial power of attorney in order to put Angelena back in [facility] and to ensure that Joseph's money for  disability is separated out from [employer].  We discussed Jaretzi's health - heart failure with an EF of 15%, not eating or drinking well, an inability to perform her own ADLS, likely bed bound, existing wounds, etc... Abigail Butts commented that the wounds were present when the patient was admitted to [facility] prior to going to [facility].  We discussed Hospice Services - particularly [facility] may be appropriate if the patient does not start eating well.  I brought up code status.  After a conversation about the subject Abigail Butts felt that NO CPR, NO SHOCK would be appropriate.  She would not want Jonnelle intubated either but felt she needed to clear that with the family.  Recommendation:  Changed code status to Partial - No CPR, No Defibrillation.  Abigail Butts still wants intubation, ACLS meds if needed.  Would pursue next steps to competency evaluation and potentially even legal guardianship.  It is evident that Broadus John is unable to care for his mother at home due to cognitive deficits.   Recommend d/c to SNF when appropriate.  If patient declines further or eats only minimal sip/bites - she is eligible for hospice house on the basis of advanced heart failure, advanced dementia, minimal PO intake.   Length of Stay: 2  Current Medications: Scheduled Meds:  . apixaban  2.5 mg Oral BID  . aspirin EC  81 mg Oral Daily  . atorvastatin  20 mg Oral q1800  . digoxin  0.125 mg Oral Daily  . donepezil  5 mg Oral QHS  . feeding supplement (ENSURE ENLIVE)  237 mL Oral BID BM  . levothyroxine  150  mcg Oral QAC breakfast  . magic mouthwash  5 mL Oral QID   Or  . lidocaine  5 mL Mouth/Throat QID  . losartan  12.5 mg Oral Daily  . metoprolol tartrate  50 mg Oral BID  . multivitamin with minerals  1 tablet Oral Daily  . sodium chloride flush  3 mL Intravenous Q12H    Continuous Infusions: . sodium chloride      PRN Meds: sodium chloride, acetaminophen **OR** acetaminophen, ondansetron **OR** ondansetron (ZOFRAN) IV, senna-docusate, sodium chloride flush   Vital Signs: BP (!) 128/54   Pulse 85   Temp 97.7 F (36.5 C) (Oral)   Resp 18   Ht 5\' 9"  (1.753 m)   Wt 53.5 kg (117 lb 15.1 oz)   SpO2 100%   BMI 17.42 kg/m  SpO2: SpO2: 100 % O2 Device: O2 Device: Nasal Cannula O2 Flow Rate: O2 Flow Rate (L/min): 2 L/min  Intake/output summary:   Intake/Output Summary (Last 24 hours) at 04/28/2017 1312 Last data filed at 04/27/2017 1800 Gross per 24 hour  Intake 120 ml  Output 201 ml  Net -81 ml   LBM: Last BM Date: 04/17/2017 Baseline Weight: Weight: 68 kg (150 lb) Most recent weight: Weight: 53.5 kg (117 lb 15.1 oz)       Palliative Assessment/Data: 20%      Patient Active Problem List   Diagnosis Date Noted  . Palliative care encounter   . FTT (failure to thrive) in adult 05/11/2017  . Hypokalemia 04/25/2017  . Hypomagnesemia 04/30/2017  . Acute on chronic systolic CHF (congestive heart failure) ([place]) 04/17/2017  . Dehydration 04/18/2017  . PAD (peripheral artery disease) ([place]) 03/16/2017  . Acute systolic CHF (congestive heart failure) ([place]) 03/14/2017  . Demand ischemia ([place]) 03/13/2017  . Acute on chronic diastolic (congestive) heart failure ([place]) 03/13/2017  . Thrombocytosis ([place]) 03/13/2017  .  Protein-calorie malnutrition, severe 03/13/2017  . A-fib ([place]) 03/12/2017  . Left leg cellulitis 03/12/2017  . Hypothyroidism 03/12/2017  . Other specified hypothyroidism 01/12/2015  . Alzheimer's dementia 01/12/2015  . CARDIOMYOPATHY, ALCOHOLIC 79/48/0165  .  Essential hypertension 04/05/2009  . PERSONAL HISTORY OF SUDDEN CARDIAC ARREST 04/05/2009    Palliative Care Plan     Goals of Care and Additional Recommendations:  Limitations on Scope of Treatment: Full Scope Treatment  Code Status:  Limited code no CPR, no defibrillation  Prognosis:   < 6 months  advanced heart failure, advanced dementia, bedbound, wounds, minimal PO intake.    Discharge Planning:  [facility] for rehab with Palliative care service follow-up  Care plan was discussed with Sallye Lat  Thank you for allowing the Palliative Medicine Team to assist in the care of this patient.  Total time spent:  35 min.     Greater than 50%  of this time was spent counseling and coordinating care related to the above assessment and plan.  Florentina Jenny, PA-C Palliative Medicine  Please contact Palliative MedicineTeam phone at [phone] for questions and concerns between 7 am - 7 pm.   Please see AMION for individual provider pager numbers.

## 2017-04-28 NOTE — Plan of Care (Signed)
  Acute Rehab PT Goals(only PT should resolve) Pt will Roll Supine to Side 04/28/2017 1320 - Progressing by Hartnett-Rands, Kourosh Jablonsky, PT Flowsheets Taken 04/28/2017 1320  Pt will Roll Supine to Side with min assist Pt Will Go Supine/Side To Sit 04/28/2017 1320 - Progressing by Hartnett-Rands, Pamala Hurry, PT Flowsheets Taken 04/28/2017 1320  Pt will go Supine/Side to Sit with moderate assist Pt Will Go Sit To Supine/Side 04/28/2017 1320 - Progressing by Hartnett-Rands, Pamala Hurry, PT Flowsheets Taken 04/28/2017 1320  Pt will go Sit to Supine/Side with moderate assist Patient Will Perform Sitting Balance 04/28/2017 1320 - Progressing by Hartnett-Rands, Pamala Hurry, PT Flowsheets Taken 04/28/2017 1320  Patient will perform sitting balance with moderate assist Patient Will Transfer Sit To/From Stand 04/28/2017 1320 - Progressing by Jeannie Done, PT Flowsheets Taken 04/28/2017 1320  Patient will transfer sit to/from stand with moderate assist   Pamala Hurry D. Hartnett-Rands, MS, PT Per [place] [phone]

## 2017-04-28 NOTE — Progress Notes (Signed)
No charge note.    I have exchanged voice mail messages with Onyx Edgley [facility]) and daughter in law (Per patient).  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  She must have another job as well as her last message to me was that she could talk with me after work on the phone today 2/12 if I called her while she was driving at 2:22.     The other face sheet contract listed is [place].  Per the patient Tina West is [name]'s son (her grand son).  Patient is pleasantly demented and not a reliable historian, but tells me that she lives in a trailer with her son Jenny Reichmann and they do the best they can.  I will West Abigail Butts today at [place] pm  Florentina Jenny, PA-C Palliative Medicine Pager: [phone]
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
## 2017-04-28 NOTE — NC FL2 (Signed)
[place] LEVEL OF CARE SCREENING TOOL     IDENTIFICATION  Patient Name: Tina West Birthdate: 09/01/1928 Sex: female Admission Date (Current Location): 05/04/2017  [facility] and [place] Number:  [employer] and Address:  [place] [address] [place]      Provider Number: [id number]  Attending Physician Name and Address:  Isaac Bliss, [place]  Relative Name and Phone Number:       Current Level of Care: Hospital Recommended Level of Care: [place] Prior Approval Number:    Date Approved/Denied:   PASRR Number: [id number]  Discharge Plan: SNF    Current Diagnoses: Patient Active Problem List   Diagnosis Date Noted  . Palliative care encounter   . FTT (failure to thrive) in adult 05/04/2017  . Hypokalemia 05/11/2017  . Hypomagnesemia 04/27/2017  . Acute on chronic systolic CHF (congestive heart failure) ([place]) 05/04/2017  . Dehydration 04/25/2017  . PAD (peripheral artery disease) ([place]) 03/16/2017  . Acute systolic CHF (congestive heart failure) ([place]) 03/14/2017  . Demand ischemia ([place]) 03/13/2017  . Acute on chronic diastolic (congestive) heart failure ([place]) 03/13/2017  . Thrombocytosis ([place]) 03/13/2017  . Protein-calorie malnutrition, severe 03/13/2017  . A-fib ([place]) 03/12/2017  . Left leg cellulitis 03/12/2017  . Hypothyroidism 03/12/2017  . Other specified hypothyroidism 01/12/2015  . Alzheimer's dementia 01/12/2015  . CARDIOMYOPATHY, ALCOHOLIC 19/41/7408  . Essential hypertension 04/05/2009  . PERSONAL HISTORY OF SUDDEN CARDIAC ARREST 04/05/2009    Orientation RESPIRATION BLADDER Height & Weight     Place  Normal Incontinent Weight: 117 lb 15.1 oz (53.5 kg) Height:  5\' 9"  (175.3 cm)  BEHAVIORAL SYMPTOMS/MOOD NEUROLOGICAL BOWEL NUTRITION STATUS      Incontinent Diet  AMBULATORY STATUS COMMUNICATION OF NEEDS Skin   Extensive Assist Verbally PU Stage and Appropriate  Care(buttocks: right, left; lumbar)                       Personal Care Assistance Level of Assistance  Bathing, Feeding, Dressing Bathing Assistance: Maximum assistance Feeding assistance: Limited assistance       Functional Limitations Info  Sight, Hearing, Speech Sight Info: Adequate Hearing Info: Adequate Speech Info: Adequate    SPECIAL CARE FACTORS FREQUENCY  PT (By licensed PT)     PT Frequency: 5x/week              Contractures Contractures Info: Not present    Additional Factors Info  Code Status, Allergies, Psychotropic Code Status Info: Partial Allergies Info: Codeine, Contrast Media, Diazepam, Diazepam, Meperidine Hcl, Morphine,  Psychotropic Info: Xanax         Current Medications (04/28/2017):  This is the current hospital active medication list Current Facility-Administered Medications  Medication Dose Route Frequency Provider Last Rate Last Dose  . 0.9 %  sodium chloride infusion  250 mL Intravenous PRN Isaac Bliss, Rayford Halsted, MD      . acetaminophen (TYLENOL) tablet 650 mg  650 mg Oral Q6H PRN Isaac Bliss, Rayford Halsted, MD   650 mg at 04/28/17 0758   Or  . acetaminophen (TYLENOL) suppository 650 mg  650 mg Rectal Q6H PRN Isaac Bliss, Rayford Halsted, MD      . apixaban Arne [place]) tablet 2.5 mg  2.5 mg Oral BID Isaac Bliss, Rayford Halsted, MD   2.5 mg at 04/28/17 0901  . aspirin EC tablet 81 mg  81 mg Oral Daily Isaac Bliss, Rayford Halsted, MD   81 mg at 04/28/17 0900  .  atorvastatin (LIPITOR) tablet 20 mg  20 mg Oral q1800 Isaac Bliss, Rayford Halsted, MD   20 mg at 04/27/17 1702  . digoxin (LANOXIN) tablet 0.125 mg  0.125 mg Oral Daily Isaac Bliss, Rayford Halsted, MD   0.125 mg at 04/28/17 0900  . donepezil (ARICEPT) tablet 5 mg  5 mg Oral QHS Isaac Bliss, Rayford Halsted, MD   5 mg at 04/27/17 2051  . feeding supplement (ENSURE ENLIVE) (ENSURE ENLIVE) liquid 237 mL  237 mL Oral BID BM Isaac Bliss, Rayford Halsted, MD   237 mL at 04/28/17 0908  .  levothyroxine (SYNTHROID, LEVOTHROID) tablet 150 mcg  150 mcg Oral QAC breakfast Isaac Bliss, Rayford Halsted, MD   150 mcg at 04/28/17 0753  . magic mouthwash  5 mL Oral QID Isaac Bliss, Rayford Halsted, MD   5 mL at 04/28/17 1409   Or  . lidocaine (XYLOCAINE) 2 % viscous mouth solution 5 mL  5 mL Mouth/Throat QID Isaac Bliss, Rayford Halsted, MD   5 mL at 04/27/17 1656  . losartan (COZAAR) tablet 12.5 mg  12.5 mg Oral Daily Isaac Bliss, Rayford Halsted, MD   12.5 mg at 04/28/17 0901  . metoprolol tartrate (LOPRESSOR) tablet 50 mg  50 mg Oral BID Isaac Bliss, Rayford Halsted, MD   50 mg at 04/28/17 0900  . multivitamin with minerals tablet 1 tablet  1 tablet Oral Daily Isaac Bliss, Rayford Halsted, MD   1 tablet at 04/28/17 0901  . ondansetron (ZOFRAN) tablet 4 mg  4 mg Oral Q6H PRN Isaac Bliss, Rayford Halsted, MD   4 mg at 04/27/17 1657   Or  . ondansetron (ZOFRAN) injection 4 mg  4 mg Intravenous Q6H PRN Isaac Bliss, Rayford Halsted, MD      . senna-docusate (Senokot-S) tablet 1 tablet  1 tablet Oral QHS PRN Isaac Bliss, Rayford Halsted, MD      . sodium chloride flush (NS) 0.9 % injection 3 mL  3 mL Intravenous Q12H Isaac Bliss, Rayford Halsted, MD   3 mL at 04/28/17 0909  . sodium chloride flush (NS) 0.9 % injection 3 mL  3 mL Intravenous PRN Isaac Bliss, Rayford Halsted, MD         Discharge Medications: Please see discharge summary for a list of discharge medications.  Relevant Imaging Results:  Relevant Lab Results:   Additional Information SS#: [id number]  Ihor Gully, LCSW

## 2017-04-28 NOTE — Clinical Social Work Note (Signed)
LCSW attempted to contact patient's son at numbers listed on chart and both numbers were disconnected.   LCSW left a message for patient's niece listed on the chart requesting return contact.     Alyze Lauf, Clydene Pugh, LCSW

## 2017-04-28 NOTE — Clinical Social Work Note (Signed)
Stephanie Carroll with [facility] stated that she met with patient on yesterday.   She stated that if patient continues to be as confused that she is, she is certain that she would have to file for custody.  She stated that patient has the financial resources to private pay, however if she is not competent to make those decision for herself she will have to have a guardian of the estate appointed.      ,  D, LCSW  

## 2017-04-28 NOTE — Evaluation (Signed)
Physical Therapy Evaluation Patient Details Name: Tina West MRN: [id number] DOB: May 07, 1928 Today's Date: 04/28/2017   History of Present Illness  84 year old woman admitted from home on 2/10 following a fall.  She has multiple medical comorbidities including systolic heart failure with an ejection fraction of 15%, had a cardiac catheterization in January with multi coronary artery disease and decision was made to treat medically.  She was found to have very poor living conditions at home and APS report has been opened.    Clinical Impression  Patient is an 84 year old female presenting to the hospital after a fall 04/25/2017. PT not able to get a clear answer on how patient fell or where nor patient's PLOF before fall. Patient is clearly confused. She presents with significant deficits in strength, range of motion, bed mobility, transfers and gait abilities requiring max to total assist for these activities. Transfers and gait were not formally assessed today as evaluation was limited by patient's pain level, stiffness and resistance to movement. Patient would benefit from continued physical therapy in this and well as the next venue to assist patient in recovering her PLOF for strength, ROM, bed mobility, transfers and gait abilities.     Follow Up Recommendations SNF;Supervision/Assistance - 24 hour    Equipment Recommendations  None recommended by PT(at this time; further assessment needed.)    Recommendations for Other Services       Precautions / Restrictions Precautions Precautions: Fall Restrictions Weight Bearing Restrictions: No      Mobility  Bed Mobility Overal bed mobility: Needs Assistance Bed Mobility: Rolling Rolling: Max assist            Transfers                 General transfer comment: did not attempt transfers today d/t patient's pain level, stiffness and difficulty moving; unsure of patient's actual PLOF before  admission.  Ambulation/Gait             General Gait Details: did not attempt ambulation today d/t patient's pain level, stiffness and difficulty moving.  Stairs            Wheelchair Mobility    Modified Rankin (Stroke Patients Only)       Balance Overall balance assessment: History of Falls                                           Pertinent Vitals/Pain Pain Assessment: Faces Faces Pain Scale: Hurts whole lot Pain Location: stomach, toes, vaginal area, length of backside Pain Descriptors / Indicators: Sore;Aching Pain Intervention(s): Limited activity within patient's tolerance;Monitored during session(PT discussed patient's pain with nursing)    Home Living Family/patient expects to be discharged to:: Unsure Living Arrangements: Children               Additional Comments: Lives with son.     Prior Function Level of Independence: Needs assistance   Gait / Transfers Assistance Needed: Patient reports she fell going up the stairs, and that she would walk [employer] with a cart to hold onto, however, patient a poor historian.           Hand Dominance        Extremity/Trunk Assessment        Lower Extremity Assessment Lower Extremity Assessment: Difficult to assess due to impaired cognition;Generalized weakness(patient stiff, painful and was not able tolerate much  testing today.)       Communication   Communication: No difficulties  Cognition Arousal/Alertness: Awake/alert Behavior During Therapy: WFL for tasks assessed/performed Overall Cognitive Status: No family/caregiver present to determine baseline cognitive functioning                                 General Comments: poor historian      General Comments General comments (skin integrity, edema, etc.): did not attempt balance activities today d/t patient's pain level, stiffness and difficulty moving. Patient does have pressure wounds on buttock/sacral  area, contusion on right shin, abrasions on bilateral 1-4 dorsum of toes    Exercises General Exercises - Upper Extremity Shoulder Flexion: AAROM;Supine;Both;5 reps General Exercises - Lower Extremity Ankle Circles/Pumps: AROM;Supine;Both;5 reps Heel Slides: AAROM;Both;5 reps;Supine   Assessment/Plan    PT Assessment Patient needs continued PT services  PT Problem List Decreased strength;Decreased cognition;Decreased range of motion;Decreased knowledge of use of DME;Decreased activity tolerance;Decreased safety awareness;Decreased skin integrity;Decreased balance;Decreased knowledge of precautions;Decreased mobility;Decreased coordination;Pain       PT Treatment Interventions Balance training;DME instruction;Gait training;Functional mobility training;Therapeutic activities;Therapeutic exercise;Patient/family education    PT Goals (Current goals can be found in the Care Plan section)  Acute Rehab PT Goals Patient Stated Goal: not sure PT Goal Formulation: With patient Time For Goal Achievement: 05/12/17 Potential to Achieve Goals: Fair    Frequency Min 3X/week   Barriers to discharge        Co-evaluation               AM-PAC PT "6 Clicks" Daily Activity  Outcome Measure Difficulty turning over in bed (including adjusting bedclothes, sheets and blankets)?: A Lot Difficulty moving from lying on back to sitting on the side of the bed? : Unable Difficulty sitting down on and standing up from a chair with arms (e.g., wheelchair, bedside commode, etc,.)?: Unable Help needed moving to and from a bed to chair (including a wheelchair)?: Total Help needed walking in hospital room?: Total Help needed climbing 3-5 steps with a railing? : Total 6 Click Score: 7    End of Session   Activity Tolerance: Patient limited by pain Patient left: in bed;with call bell/phone within reach;with bed alarm set Nurse Communication: Mobility status;Precautions(need for repositioning every 2  hours for pressure relief.; PT worked with nursing to reposition patient toward right side.) PT Visit Diagnosis: History of falling (Z91.81);Muscle weakness (generalized) (M62.81);Other abnormalities of gait and mobility (R26.89);Adult, failure to thrive (R62.7);Pain Pain - part of body: (trunk and bilateral lower extremities secondary to fall.)    Time: 1208-1243 PT Time Calculation (min) (ACUTE ONLY): 35 min   Charges:   PT Evaluation $PT Eval Low Complexity: 1 Low PT Treatments $Therapeutic Activity: 8-22 mins   PT G Codes:        Latacha Texeira D. Hartnett-Rands, MS, PT [place] [phone] 04/28/2017, 1:13 PM

## 2017-04-28 NOTE — Consult Note (Signed)
   [facility] CM Inpatient Consult   04/28/2017  Tina West Apr 08, 1928 [id number]   Patient is currently active with [facility] for chronic disease management services.  Patient has been engaged by a [employer] and CSW.  Our community based plan of care has focused on disease management and community resource support.  Patient will receive a post discharge transition of care call and will be evaluated for monthly home visits for assessments and disease process education. If patient is discharged to SNF, [facility] Social Worker will visit and assist with discharge planning. Made Inpatient Case Manager aware that [facility] following. Of note, [facility] Care Management services does not replace or interfere with any services that are needed or arranged by inpatient case management or social work.  For additional questions or referrals please contact:  Zeke Aker RN, [facility] Liaison  [phone]) [place] [phone]) Toll free office

## 2017-04-29 DIAGNOSIS — L8990 Pressure ulcer of unspecified site, unspecified stage: Secondary | ICD-10-CM

## 2017-04-29 LAB — BASIC METABOLIC PANEL
ANION GAP: 12 (ref 5–15)
BUN: 86 mg/dL — ABNORMAL HIGH (ref 6–20)
CO2: 29 mmol/L (ref 22–32)
Calcium: 8.8 mg/dL — ABNORMAL LOW (ref 8.9–10.3)
Chloride: 88 mmol/L — ABNORMAL LOW (ref 101–111)
Creatinine, Ser: 1.76 mg/dL — ABNORMAL HIGH (ref 0.44–1.00)
GFR, EST AFRICAN AMERICAN: 29 mL/min — AB (ref >60)
GFR, EST NON AFRICAN AMERICAN: 25 mL/min — AB (ref >60)
Glucose, Bld: 147 mg/dL — ABNORMAL HIGH (ref 65–99)
Potassium: 4.4 mmol/L (ref 3.5–5.1)
Sodium: 129 mmol/L — ABNORMAL LOW (ref 135–145)

## 2017-04-29 MED ORDER — MAGIC MOUTHWASH
5.0000 mL | Freq: Four times a day (QID) | ORAL | Status: DC
  Administered 2017-04-29 – 2017-05-03 (×10): 5 mL via ORAL
  Administered 2017-05-05: 01:00:00 via ORAL
  Administered 2017-05-06 – 2017-05-12 (×22): 5 mL via ORAL
  Filled 2017-04-29 (×35): qty 5

## 2017-04-29 MED ORDER — SODIUM CHLORIDE 0.9 % IV SOLN
INTRAVENOUS | Status: AC
  Administered 2017-04-29: 15:00:00 via INTRAVENOUS

## 2017-04-29 MED ORDER — LIDOCAINE VISCOUS 2 % MT SOLN
5.0000 mL | Freq: Four times a day (QID) | OROMUCOSAL | Status: DC
  Administered 2017-04-29 – 2017-05-12 (×32): 5 mL via OROMUCOSAL
  Filled 2017-04-29 (×34): qty 15

## 2017-04-29 NOTE — Progress Notes (Signed)
PROGRESS NOTE    Tina West  QJJ:[id number] DOB: 06-Jan-1929 DOA: 05/04/2017 PCP: Claretta Fraise, MD   Brief Narrative:   This is an 84 year old female who was admitted from home on 2/10 following a fall.  She was found to have very poor living conditions at home and an APS report has been opened.  She has been diagnosed with failure to thrive with severe protein calorie malnutrition as well as some dehydration in the setting of Alzheimer's dementia.  She has multiple medical comorbidities including systolic heart failure with an EF of 15%.  Assessment & Plan:   Principal Problem:   FTT (failure to thrive) in adult Active Problems:   Other specified hypothyroidism   Alzheimer's dementia   Hypothyroidism   Hypokalemia   Hypomagnesemia   Acute on chronic systolic CHF (congestive heart failure) (HCC)   Dehydration   Palliative care encounter   Pressure injury of skin   1. Failure to thrive/severe protein calorie malnutrition.  Will order TTS evaluation to determine ability for decision-making and need for guardianship prior to placement.  Appreciate dietitian input and it appears that patient has been eating better now that the tray is closer to her.  She continues to have some confusion and does not appear to have decision-making capacity. 2. AK I with hyponatremia.  Place on IV fluid with normal saline, time limited and recheck a.m. labs.  With hold home losartan for now. 3. Chronic systolic heart failure with EF 15-20%.  Currently appears volume depleted for which I will initiate gentle time limited IV fluid to treat AK I. 4. Alzheimer's dementia.  Continue Aricept. 5. Hypothyroidism.  Continue Synthroid at lower dose of 150 mcg. 6. History of atrial fibrillation.  Currently anticoagulated on Eliquis and rate controlled with Lopressor.   DVT prophylaxis:Eliquis Code Status: Partial; no CPR or shock, but ok with intubation Family Communication: None Disposition Plan: Will  need SNF after guardianship assessment   Consultants:   Palliative Care  Procedures:   None  Antimicrobials:   None   Subjective: Patient seen and evaluated today with no new acute complaints or concerns. No acute concerns or events noted overnight. She appears to have finished her meal tray. Claims to have "marbles" under her feet.  Objective: Vitals:   04/28/17 1400 04/28/17 2010 04/29/17 0409 04/29/17 0949  BP: (!) 152/71 (!) 102/58 131/62 (!) 120/48  Pulse: 81 (!) 103 (!) 115 72  Resp:      Temp:  98.2 F (36.8 C) 98.8 F (37.1 C)   TempSrc:  Oral Oral   SpO2: 100% 97% 99%   Weight:      Height:        Intake/Output Summary (Last 24 hours) at 04/29/2017 1239 Last data filed at 04/28/2017 2357 Gross per 24 hour  Intake 120 ml  Output 800 ml  Net -680 ml   Filed Weights   05/02/2017 0954 04/28/2017 1432  Weight: 68 kg (150 lb) 53.5 kg (117 lb 15.1 oz)    Examination:  General exam: Appears calm and comfortable  Respiratory system: Clear to auscultation. Respiratory effort normal. Cardiovascular system: S1 & S2 heard, RRR. No JVD, murmurs, rubs, gallops or clicks. No pedal edema. Gastrointestinal system: Abdomen is nondistended, soft and nontender. No organomegaly or masses felt. Normal bowel sounds heard. Central nervous system: Alert and oriented. No focal neurological deficits. Extremities: Symmetric 5 x 5 power. Skin: No rashes, lesions or ulcers Psychiatry: Confused x 3.    Data Reviewed: I have  personally reviewed following labs and imaging studies  CBC: Recent Labs  Lab 05/10/2017 1008 04/27/17 0619 04/28/17 0552  WBC 25.3* 24.2* 22.4*  NEUTROABS 23.7*  --   --   HGB 12.2 10.4* 11.2*  HCT 40.8 35.0* 37.2  MCV 84.0 83.3 82.5  PLT 640* 511* 259*   Basic Metabolic Panel: Recent Labs  Lab 04/30/2017 1008 04/27/17 0619 04/28/17 0552 04/29/17 0827  NA 141 134* 129* 129*  K 2.4* 3.0* 3.8 4.4  CL 92* 93* 88* 88*  CO2 31 29 27 29   GLUCOSE 110*  136* 132* 147*  BUN 32* 44* 66* 86*  CREATININE 0.88 1.02* 1.51* 1.76*  CALCIUM 9.4 8.5* 8.8* 8.8*  MG 1.6*  --  2.7*  --    GFR: Estimated Creatinine Clearance: 19.4 mL/min (A) (by C-G formula based on SCr of 1.76 mg/dL (H)). Liver Function Tests: Recent Labs  Lab 05/08/2017 1008  AST 32  ALT 32  ALKPHOS 87  BILITOT 4.2*  PROT 6.8  ALBUMIN 3.3*   No results for input(s): LIPASE, AMYLASE in the last 168 hours. No results for input(s): AMMONIA in the last 168 hours. Coagulation Profile: No results for input(s): INR, PROTIME in the last 168 hours. Cardiac Enzymes: Recent Labs  Lab 04/20/2017 1008  TROPONINI 0.13*   BNP (last 3 results) No results for input(s): PROBNP in the last 8760 hours. HbA1C: No results for input(s): HGBA1C in the last 72 hours. CBG: No results for input(s): GLUCAP in the last 168 hours. Lipid Profile: No results for input(s): CHOL, HDL, LDLCALC, TRIG, CHOLHDL, LDLDIRECT in the last 72 hours. Thyroid Function Tests: No results for input(s): TSH, T4TOTAL, FREET4, T3FREE, THYROIDAB in the last 72 hours. Anemia Panel: No results for input(s): VITAMINB12, FOLATE, FERRITIN, TIBC, IRON, RETICCTPCT in the last 72 hours. Sepsis Labs: Recent Labs  Lab 05/01/2017 1058 04/18/2017 1243  LATICACIDVEN 1.9 1.2    No results found for this or any previous visit (from the past 240 hour(s)).       Radiology Studies: No results found.      Scheduled Meds: . apixaban  2.5 mg Oral BID  . aspirin EC  81 mg Oral Daily  . atorvastatin  20 mg Oral q1800  . digoxin  0.125 mg Oral Daily  . donepezil  5 mg Oral QHS  . feeding supplement (ENSURE ENLIVE)  237 mL Oral BID BM  . levothyroxine  150 mcg Oral QAC breakfast  . magic mouthwash  5 mL Oral QID   Or  . lidocaine  5 mL Mouth/Throat QID  . metoprolol tartrate  50 mg Oral BID  . multivitamin with minerals  1 tablet Oral Daily  . sodium chloride flush  3 mL Intravenous Q12H   Continuous Infusions: .  sodium chloride    . sodium chloride       LOS: 3 days    Time spent: 30 minutes    Pratik Darleen Crocker, DO [facility] Pager [phone]  If 7PM-7AM, please contact night-coverage [email or web address] Password [facility] 04/29/2017, 12:39 PM

## 2017-04-29 NOTE — Progress Notes (Signed)
Physical Therapy Treatment Patient Details Name: Tina West MRN: [id number] DOB: March 24, 1928 Today's Date: 04/29/2017    History of Present Illness 84 year old woman admitted from home on 2/10 following a fall.  She has multiple medical comorbidities including systolic heart failure with an ejection fraction of 15%, had a cardiac catheterization in January with multi coronary artery disease and decision was made to treat medically.  She was found to have very poor living conditions at home and APS report has been opened.    PT Comments    PT laying in bed laying diagonally with UE leaned to Left.  Pt stiff/rigid at attempts to position more in neutral positioning. PT very distracted, talking about her son and her care with difficulty following verbal cues.  Able to complete therex to LE's with tactile/manual cues but in limited ROM.  Heelslides only to approx 20 degrees of knee flexion before wincing as if the motion is painful.  Pt required max assist to properly position self in bed.  Did not attempt transfer due to difficulty following instructions and no family members present.            Precautions / Restrictions Precautions Precautions: Fall Restrictions Weight Bearing Restrictions: No    Mobility  Bed Mobility Overal bed mobility: Needs Assistance Bed Mobility: Rolling Rolling: Max assist         General bed mobility comments: max to total assist for positioning and basic bed mobiltiy  Transfers                 General transfer comment: did not attempt transfers today d/t patient's pain level, stiffness and difficulty moving; unsure of patient's actual PLOF before admission.  Ambulation/Gait             General Gait Details: did not attempt ambulation today d/t patient's pain level, stiffness and difficulty moving.                                                   Cognition Arousal/Alertness: Awake/alert   Overall Cognitive  Status: Difficult to assess                                 General Comments: pt remained distracted during therapy requireing constant redirection      Exercises General Exercises - Lower Extremity Ankle Circles/Pumps: AAROM;10 reps;Supine;Both Heel Slides: AAROM;10 reps;Supine;Both Hip ABduction/ADduction: AAROM;10 reps;Both;Supine            Pertinent Vitals/Pain Pain Assessment: No/denies pain    Home Living                                  PT Goals (current goals can now be found in the care plan section) Progress towards PT goals: Not progressing toward goals - comment(difficult to follow instructions)     AM-PAC PT "6 Clicks" Daily Activity  Outcome Measure                   End of Session   Activity Tolerance: Patient limited by pain;Other (comment) Patient left: in bed;with call bell/phone within reach;with bed alarm set   PT Visit Diagnosis: History of falling (Z91.81);Muscle weakness (generalized) (M62.81);Other abnormalities of gait and mobility (R26.89);Adult,  failure to thrive (R62.7);Pain     Time: 1330-1355 PT Time Calculation (min) (ACUTE ONLY): 25 min  Charges:  $Therapeutic Exercise: 8-22 mins $Therapeutic Activity: 8-22 mins                       Tina West, PTA/CLT [phone]    Tina West, Tina West 04/29/2017, 6:18 PM

## 2017-04-29 NOTE — Progress Notes (Signed)
Noticed pt had tray of food that had not been touched. The bedside table was not within reach. I moved table over patient and offered her the food. She said she was hungry but could not reach the food earlier. I offered to heat food up because it appeared cold. She didn't want me to, she said she preferred it cold. I poured Strawberry Ensure in cup with ice and patient began to eat. She fed herself and did well. She ate 100% of her meal including 100% of beverages (Ensure, Tea). Pt able to feed self as long as food in placed within reach.

## 2017-04-29 NOTE — Clinical Social Work Note (Signed)
Tina West at [facility] stated that she is going to go back and staff patient with her supervisor and apply for interim guardianship.  She stated that there will have to be two hearings, a guardian of the patient and a guardian of the estate. Tina West spoke with Tina West, with [facility], who advised that if patient was authorized they could accept her and bill her for the copays until her estate guardian was appointed.   LCSW started patient's SNF authorization with [employer].    Tina West, Tina Pugh, LCSW

## 2017-04-30 LAB — BASIC METABOLIC PANEL
ANION GAP: 10 (ref 5–15)
BUN: 99 mg/dL — ABNORMAL HIGH (ref 6–20)
CHLORIDE: 92 mmol/L — AB (ref 101–111)
CO2: 30 mmol/L (ref 22–32)
CREATININE: 1.8 mg/dL — AB (ref 0.44–1.00)
Calcium: 8.8 mg/dL — ABNORMAL LOW (ref 8.9–10.3)
GFR calc non Af Amer: 24 mL/min — ABNORMAL LOW (ref >60)
GFR, EST AFRICAN AMERICAN: 28 mL/min — AB (ref >60)
Glucose, Bld: 132 mg/dL — ABNORMAL HIGH (ref 65–99)
Potassium: 4.7 mmol/L (ref 3.5–5.1)
Sodium: 132 mmol/L — ABNORMAL LOW (ref 135–145)

## 2017-04-30 LAB — URINALYSIS, ROUTINE W REFLEX MICROSCOPIC
Bilirubin Urine: NEGATIVE
GLUCOSE, UA: NEGATIVE mg/dL
KETONES UR: NEGATIVE mg/dL
NITRITE: NEGATIVE
PROTEIN: 30 mg/dL — AB
Specific Gravity, Urine: 1.011 (ref 1.005–1.030)
pH: 6 (ref 5.0–8.0)

## 2017-04-30 LAB — CBC
HEMATOCRIT: 35.2 % — AB (ref 36.0–46.0)
Hemoglobin: 10.7 g/dL — ABNORMAL LOW (ref 12.0–15.0)
MCH: 24.9 pg — ABNORMAL LOW (ref 26.0–34.0)
MCHC: 30.4 g/dL (ref 30.0–36.0)
MCV: 82.1 fL (ref 78.0–100.0)
Platelets: 423 10*3/uL — ABNORMAL HIGH (ref 150–400)
RBC: 4.29 MIL/uL (ref 3.87–5.11)
RDW: 16.9 % — ABNORMAL HIGH (ref 11.5–15.5)
WBC: 21.1 10*3/uL — ABNORMAL HIGH (ref 4.0–10.5)

## 2017-04-30 MED ORDER — SODIUM CHLORIDE 0.9 % IV SOLN
1.0000 g | INTRAVENOUS | Status: AC
  Administered 2017-04-30 – 2017-05-06 (×6): 1 g via INTRAVENOUS
  Filled 2017-04-30: qty 1
  Filled 2017-04-30: qty 10
  Filled 2017-04-30 (×3): qty 1
  Filled 2017-04-30 (×3): qty 10

## 2017-04-30 MED ORDER — SODIUM CHLORIDE 0.9 % IV SOLN
INTRAVENOUS | Status: AC
  Administered 2017-04-30: 09:00:00 via INTRAVENOUS

## 2017-04-30 NOTE — Progress Notes (Addendum)
PROGRESS NOTE    Tina West  PJK:[id number] DOB: [birth year] DOA: 04/24/2017 PCP: Claretta Fraise, MD   Brief Narrative:   This is an 84 year old female who was admitted from home on 2/10 following a fall.  She was found to have very poor living conditions at home and an APS report has been opened.  She has been diagnosed with failure to thrive with severe protein calorie malnutrition as well as some dehydration in the setting of Alzheimer's dementia.  She has multiple medical comorbidities including systolic heart failure with an EF of 15%.  She is noted to remain confused and has had some mild hyponatremia which has corrected with IV fluid.  She appears to have foul-smelling urine this morning.   Assessment & Plan:   Principal Problem:   FTT (failure to thrive) in adult Active Problems:   Other specified hypothyroidism   Alzheimer's dementia   Hypothyroidism   Hypokalemia   Hypomagnesemia   Acute on chronic systolic CHF (congestive heart failure) (HCC)   Dehydration   Palliative care encounter   Pressure injury of skin   1. Failure to thrive/severe protein calorie malnutrition. Appreciate TTS evaluation to determine ability for decision-making and need for guardianship prior to placement.  Appreciate dietitian input and it appears that patient has been eating better now that the tray is closer to her.  She continues to have some confusion and does not appear to have decision-making capacity. SW working on guardianship and placement to [facility]. 2. AK I with hyponatremia.  Continue on IV fluid with normal saline, time limited and recheck a.m. labs.  With hold home losartan for now. Hyponatremia is improving.  3. UTI. Check U/A and cultures. Start empirically on Rocephin. 4. Chronic systolic heart failure with EF 15-20%.  Currently appears volume depleted for which I will initiate gentle time limited IV fluid to treat AK I. 5. Alzheimer's dementia.  Continue  Aricept. 6. Hypothyroidism.  Continue Synthroid at lower dose of 150 mcg. 7. History of atrial fibrillation.  Currently anticoagulated on Eliquis and rate controlled with Lopressor. 8. Pressure injuries of skin to bilateral buttocks, lumbar spine, and feet.   DVT prophylaxis:Eliquis Code Status: Partial; ok with intubation Family Communication: None at bedside Disposition Plan: SNF after guardianship assessment   Consultants:   Palliative Care  Procedures:   None  Antimicrobials:   Rocephin 2/14->   Subjective: Patient seen and evaluated today with no new acute complaints or concerns. No acute concerns or events noted overnight. She is still confused and has foul odor to her urine today.  Objective: Vitals:   04/29/17 0409 04/29/17 0949 04/29/17 2315 04/30/17 0622  BP: 131/62 (!) 120/48 130/65 132/74  Pulse: (!) 115 72 (!) 54 92  Resp:   18 18  Temp: 98.8 F (37.1 C)  98.2 F (36.8 C) 98 F (36.7 C)  TempSrc: Oral  Oral Oral  SpO2: 99%  99% 95%  Weight:      Height:        Intake/Output Summary (Last 24 hours) at 04/30/2017 1107 Last data filed at 04/30/2017 0928 Gross per 24 hour  Intake 643 ml  Output 950 ml  Net -307 ml   Filed Weights   05/11/2017 0954 05/10/2017 1432  Weight: 68 kg (150 lb) 53.5 kg (117 lb 15.1 oz)    Examination:  General exam: Appears calm and comfortable ; confused Respiratory system: Clear to auscultation. Respiratory effort normal. Cardiovascular system: S1 & S2 heard, RRR. No JVD,  murmurs, rubs, gallops or clicks. No pedal edema. Gastrointestinal system: Abdomen is nondistended, soft and nontender. No organomegaly or masses felt. Normal bowel sounds heard. Central nervous system: Alert and oriented. No focal neurological deficits. Extremities: Symmetric 5 x 5 power; dark yellow UO with foul odor noted Skin: No rashes, lesions or ulcers    Data Reviewed: I have personally reviewed following labs and imaging  studies  CBC: Recent Labs  Lab 05/02/2017 1008 04/27/17 0619 04/28/17 0552 04/30/17 0434  WBC 25.3* 24.2* 22.4* 21.1*  NEUTROABS 23.7*  --   --   --   HGB 12.2 10.4* 11.2* 10.7*  HCT 40.8 35.0* 37.2 35.2*  MCV 84.0 83.3 82.5 82.1  PLT 640* 511* 456* 497*   Basic Metabolic Panel: Recent Labs  Lab 04/22/2017 1008 04/27/17 0619 04/28/17 0552 04/29/17 0827 04/30/17 0434  NA 141 134* 129* 129* 132*  K 2.4* 3.0* 3.8 4.4 4.7  CL 92* 93* 88* 88* 92*  CO2 31 29 27 29 30   GLUCOSE 110* 136* 132* 147* 132*  BUN 32* 44* 66* 86* 99*  CREATININE 0.88 1.02* 1.51* 1.76* 1.80*  CALCIUM 9.4 8.5* 8.8* 8.8* 8.8*  MG 1.6*  --  2.7*  --   --    GFR: Estimated Creatinine Clearance: 18.9 mL/min (A) (by C-G formula based on SCr of 1.8 mg/dL (H)). Liver Function Tests: Recent Labs  Lab 04/27/2017 1008  AST 32  ALT 32  ALKPHOS 87  BILITOT 4.2*  PROT 6.8  ALBUMIN 3.3*   No results for input(s): LIPASE, AMYLASE in the last 168 hours. No results for input(s): AMMONIA in the last 168 hours. Coagulation Profile: No results for input(s): INR, PROTIME in the last 168 hours. Cardiac Enzymes: Recent Labs  Lab 04/17/2017 1008  TROPONINI 0.13*   BNP (last 3 results) No results for input(s): PROBNP in the last 8760 hours. HbA1C: No results for input(s): HGBA1C in the last 72 hours. CBG: No results for input(s): GLUCAP in the last 168 hours. Lipid Profile: No results for input(s): CHOL, HDL, LDLCALC, TRIG, CHOLHDL, LDLDIRECT in the last 72 hours. Thyroid Function Tests: No results for input(s): TSH, T4TOTAL, FREET4, T3FREE, THYROIDAB in the last 72 hours. Anemia Panel: No results for input(s): VITAMINB12, FOLATE, FERRITIN, TIBC, IRON, RETICCTPCT in the last 72 hours. Sepsis Labs: Recent Labs  Lab 05/04/2017 1058 04/24/2017 1243  LATICACIDVEN 1.9 1.2    No results found for this or any previous visit (from the past 240 hour(s)).       Radiology Studies: No results  found.      Scheduled Meds: . apixaban  2.5 mg Oral BID  . aspirin EC  81 mg Oral Daily  . atorvastatin  20 mg Oral q1800  . digoxin  0.125 mg Oral Daily  . donepezil  5 mg Oral QHS  . feeding supplement (ENSURE ENLIVE)  237 mL Oral BID BM  . levothyroxine  150 mcg Oral QAC breakfast  . magic mouthwash  5 mL Oral QID   And  . lidocaine  5 mL Mouth/Throat QID  . metoprolol tartrate  50 mg Oral BID  . multivitamin with minerals  1 tablet Oral Daily  . sodium chloride flush  3 mL Intravenous Q12H   Continuous Infusions: . sodium chloride    . sodium chloride 60 mL/hr at 04/30/17 0921     LOS: 4 days    Time spent: 30 minutes    Kobey Sides Darleen Crocker, DO [facility] Pager [phone]  If  7PM-7AM, please contact night-coverage [email or web address] Password [facility] 04/30/2017, 11:07 AM

## 2017-04-30 NOTE — Clinical Social Work Note (Signed)
LCSW contacted patient's insurance company and was advised that patient's SNF authorization request was still under review.    Ciani Rutten, Clydene Pugh, LCSW

## 2017-04-30 NOTE — Progress Notes (Signed)
No charge note.  Discussed with attending Dr. Manuella Ghazi.  Palliative will sign off for now.  Please reconsult Palliative if we may be of assistance in the future.  Florentina Jenny, PA-C Palliative Medicine Pager: [phone]

## 2017-04-30 NOTE — Consult Note (Signed)
17:55- T. Bobby Rumpf NP  followed-up with MD P.Manuella Ghazi regarding TTS consult. MD states consults is no longer required and guardianship assessment to be followed-up by CSW.

## 2017-04-30 NOTE — Progress Notes (Signed)
Physical Therapy Treatment Patient Details Name: Tina West MRN: [id number] DOB: 02/22/1929 Today's Date: 04/30/2017    History of Present Illness 84 year old woman admitted from home on 2/10 following a fall.  She has multiple medical comorbidities including systolic heart failure with an ejection fraction of 15%, had a cardiac catheterization in January with multi coronary artery disease and decision was made to treat medically.  She was found to have very poor living conditions at home and APS report has been opened.    PT Comments    Pt laying diagonally in bed with head laterally flexed as well as upper body to Left while attempting to eat her breakfast tray.  Therapist repositioned pateint in upright posturing, howerver patient unable to maintain this position without manual assist from therapist.  Soft tissue to tight cervical musculature also completed with noted release of tightn tissue and patient reporting overall relief in pain and pressure.  Worked on transfer from supine to sit, hwoever requires total assist and then unable to establish or maintain seated balance without assistance from therapist.  Pt transferred to chair as her bed was soiled to get cleaned up and linens changed.  Pt positioned in chair after transfer with chair alarm in place.  MD present who requested therapist inform nursing of wanting a urine culture as he suspects a UTI due to recent increased confusion and strong smell of urine.  Nursing assistant Tobin Chad) informed of MD request and notified of general mobiltiy and need to replace urine wick.    Follow Up Recommendations              Precautions / Restrictions Precautions Precautions: Fall Restrictions Weight Bearing Restrictions: No    Mobility  Bed Mobility Overal bed mobility: Needs Assistance Bed Mobility: Supine to Sit     Supine to sit: Max assist;Total assist        Transfers   Equipment used: None Transfers: Stand Pivot  Transfers   Stand pivot transfers: Total assist       General transfer comment: Pt did not assist with transfer, however did bear weight through LE's during transfer.  Pt did not c/o pain during or following       Cognition Arousal/Alertness: Awake/alert Behavior During Therapy: Anxious Overall Cognitive Status: Difficult to assess                                 General Comments: pt remained distracted during therapy requireing constant redirection, talking to people not in room and general confusion      Exercises Other Exercises Other Exercises: soft tissue to cervical musculsture, positioning/holds        Pertinent Vitals/Pain Pain Assessment: No/denies pain           PT Goals (current goals can now be found in the care plan section) Progress towards PT goals: Not progressing toward goals - comment(more confusion today prohibited progress)       PT Plan Current plan remains appropriate          End of Session Equipment Utilized During Treatment: Gait belt Activity Tolerance: Treatment limited secondary to medical complications (Comment) Patient left: in chair;with call bell/phone within reach;with chair alarm set Nurse Communication: Mobility status;Other (comment)(MD request to put in urine culture)       Time: 0930-1020 PT Time Calculation (min) (ACUTE ONLY): 50 min  Charges:  $Therapeutic Activity: 38-52 mins  Teena Irani, PTA/CLT [phone]    Roseanne Reno B 04/30/2017, 1:51 PM

## 2017-04-30 NOTE — Progress Notes (Signed)
Pharmacy Antibiotic Note  Tina West is a 84 y.o. female admitted on 04/22/2017 with UTI.  Pharmacy has been consulted for ceftriaxone dosing.  Plan: Ceftriaxone 1gm IV q24h F/U cxs and clinical progress Monitor V/S, labs  Height: 5\' 9"  (175.3 cm) Weight: 117 lb 15.1 oz (53.5 kg) IBW/kg (Calculated) : 66.2  Temp (24hrs), Avg:98.1 F (36.7 C), Min:98 F (36.7 C), Max:98.2 F (36.8 C)  Recent Labs  Lab 05/09/2017 1008 04/24/2017 1058 05/11/2017 1243 04/27/17 0619 04/28/17 0552 04/29/17 0827 04/30/17 0434  WBC 25.3*  --   --  24.2* 22.4*  --  21.1*  CREATININE 0.88  --   --  1.02* 1.51* 1.76* 1.80*  LATICACIDVEN  --  1.9 1.2  --   --   --   --     Estimated Creatinine Clearance: 18.9 mL/min (A) (by C-G formula based on SCr of 1.8 mg/dL (H)).    Allergies  Allergen Reactions  . Codeine Hives  . Contrast Media [Iodinated Diagnostic Agents] Other (See Comments)    unspecified  . Diazepam Hives  . Meperidine Hcl Other (See Comments)    unspecified  . Morphine Other (See Comments)    "caused heart attack"    Antimicrobials this admission: Ceftriaxone 2/14>>   Microbiology results: 2/14 UCx: pending/to be collected  Thank you for allowing pharmacy to be a part of this patient's care.  Isac Sarna, BS Pharm D, [place] Clinical Pharmacist Pager [phone] 04/30/2017 11:30 AM

## 2017-05-01 LAB — BASIC METABOLIC PANEL
ANION GAP: 13 (ref 5–15)
BUN: 105 mg/dL — ABNORMAL HIGH (ref 6–20)
CALCIUM: 8.8 mg/dL — AB (ref 8.9–10.3)
CO2: 28 mmol/L (ref 22–32)
Chloride: 97 mmol/L — ABNORMAL LOW (ref 101–111)
Creatinine, Ser: 1.46 mg/dL — ABNORMAL HIGH (ref 0.44–1.00)
GFR calc non Af Amer: 31 mL/min — ABNORMAL LOW (ref >60)
GFR, EST AFRICAN AMERICAN: 36 mL/min — AB (ref >60)
GLUCOSE: 143 mg/dL — AB (ref 65–99)
POTASSIUM: 4.5 mmol/L (ref 3.5–5.1)
Sodium: 138 mmol/L (ref 135–145)

## 2017-05-01 LAB — CBC
HEMATOCRIT: 36.5 % (ref 36.0–46.0)
Hemoglobin: 11.2 g/dL — ABNORMAL LOW (ref 12.0–15.0)
MCH: 25 pg — ABNORMAL LOW (ref 26.0–34.0)
MCHC: 30.7 g/dL (ref 30.0–36.0)
MCV: 81.5 fL (ref 78.0–100.0)
Platelets: 472 10*3/uL — ABNORMAL HIGH (ref 150–400)
RBC: 4.48 MIL/uL (ref 3.87–5.11)
RDW: 17.2 % — AB (ref 11.5–15.5)
WBC: 24.6 10*3/uL — AB (ref 4.0–10.5)

## 2017-05-01 MED ORDER — SODIUM CHLORIDE 0.9 % IV SOLN
INTRAVENOUS | Status: AC
  Administered 2017-05-01: 20:00:00 via INTRAVENOUS

## 2017-05-01 NOTE — Clinical Social Work Note (Addendum)
10:45 Updated clinical faxed to [facility] at [facility]. Will await call regarding authorization determination.  1:45 Received call from Lyndee Leo at [employer] stating that a peer to peer is requested. Updated Dr. Manuella Ghazi of this and provided name and number for him to call. Will follow up after this is complete.  4:48 Notified by MD that he completed the peer to peer and he does not believe they are going to authorize SNF. Then received call from Lyndee Leo stating that their Medical Director has denied SNF rehab. Sharrie Rothman with APS. She will work on placement next week. Per MD, pt will remain in the hospital over the weekend.

## 2017-05-01 NOTE — Care Management Important Message (Signed)
Important Message  Patient Details  Name: Tina West MRN: [id number] Date of Birth: Apr 25, 1928   Medicare Important Message Given:  Yes    Sherald Barge, RN 05/01/2017, 12:33 PM

## 2017-05-01 NOTE — Progress Notes (Signed)
PROGRESS NOTE    Tina West  GGY:[id number] DOB: 05-Jul-1928 DOA: 04/21/2017 PCP: Claretta Fraise, MD   Brief Narrative:   This is an 84 year old female who was admitted from home on 2/10 following a fall.  She was found to have very poor living conditions at home and an APS report has been opened.  She has been diagnosed with failure to thrive with severe protein calorie malnutrition as well as some dehydration in the setting of Alzheimer's dementia.  She has multiple medical comorbidities including systolic heart failure with an EF of 15%.  She is noted to remain confused and has had some mild hyponatremia which has corrected with IV fluid.  She is noted to have a UTI for which she has been started on IV Rocephin with urine cultures pending.   Assessment & Plan:   Principal Problem:   FTT (failure to thrive) in adult Active Problems:   Other specified hypothyroidism   Alzheimer's dementia   Hypothyroidism   Hypokalemia   Hypomagnesemia   Acute on chronic systolic CHF (congestive heart failure) (HCC)   Dehydration   Palliative care encounter   Pressure injury of skin   1. Failure to thrive/severe protein calorie malnutrition. Appreciate TTS evaluation to determine ability for decision-making and need for guardianship prior to placement.  Appreciate dietitian input and it appears that patient has been eating better now that the tray is closer to her.  She continues to have some confusion and does not appear to have decision-making capacity. SW working on guardianship and placement to [facility]. 2. AK I with hyponatremia-improved.  Continue on further gentle IVF, time-limited and recheck labs in am. 3. UTI. Check U/A and cultures. Start empirically on Rocephin. 4. Chronic systolic heart failure with EF 15-20%.  Currently appears volume depleted for which I will initiate gentle time limited IV fluid to treat AK I. 5. Alzheimer's dementia.  Continue  Aricept. 6. Hypothyroidism.  Continue Synthroid at lower dose of 150 mcg. 7. History of atrial fibrillation.  Currently anticoagulated on Eliquis and rate controlled with Lopressor. 8. Pressure injuries of skin to bilateral buttocks, lumbar spine, and feet.   DVT prophylaxis:Eliquis Code Status: Partial; ok with intubation Family Communication: None at bedside Disposition Plan: SNF after guardianship assessment   Consultants:   Palliative Care  Procedures:   None  Antimicrobials:   Rocephin 2/14->   Subjective: Patient seen and evaluated today with no new acute complaints or concerns. No acute concerns or events noted overnight. She is still confused and only minimally participates with PT.  Objective: Vitals:   04/30/17 1500 04/30/17 2054 05/01/17 0649 05/01/17 1249  BP: 129/70 (!) 147/75 (!) 149/69 (!) 142/72  Pulse: 87 81 100 94  Resp: 19     Temp: 98.3 F (36.8 C) 98.4 F (36.9 C) (!) 97.4 F (36.3 C)   TempSrc: Oral Oral Axillary   SpO2:  99% 99%   Weight:   55.1 kg (121 lb 7.6 oz)   Height:        Intake/Output Summary (Last 24 hours) at 05/01/2017 1632 Last data filed at 05/01/2017 1300 Gross per 24 hour  Intake 240 ml  Output -  Net 240 ml   Filed Weights   05/06/2017 0954 05/11/2017 1432 05/01/17 0649  Weight: 68 kg (150 lb) 53.5 kg (117 lb 15.1 oz) 55.1 kg (121 lb 7.6 oz)    Examination:  General exam: Appears calm and comfortable ; confused Respiratory system: Clear to auscultation. Respiratory effort  normal. Cardiovascular system: S1 & S2 heard, RRR. No JVD, murmurs, rubs, gallops or clicks. No pedal edema. Gastrointestinal system: Abdomen is nondistended, soft and nontender. No organomegaly or masses felt. Normal bowel sounds heard. Central nervous system: Alert and oriented. No focal neurological deficits. Extremities: Symmetric 5 x 5 power; dark yellow UO with foul odor noted Skin: No rashes, lesions or ulcers    Data Reviewed: I have  personally reviewed following labs and imaging studies  CBC: Recent Labs  Lab 05/09/2017 1008 04/27/17 0619 04/28/17 0552 04/30/17 0434 05/01/17 0533  WBC 25.3* 24.2* 22.4* 21.1* 24.6*  NEUTROABS 23.7*  --   --   --   --   HGB 12.2 10.4* 11.2* 10.7* 11.2*  HCT 40.8 35.0* 37.2 35.2* 36.5  MCV 84.0 83.3 82.5 82.1 81.5  PLT 640* 511* 456* 423* 630*   Basic Metabolic Panel: Recent Labs  Lab 05/12/2017 1008 04/27/17 0619 04/28/17 0552 04/29/17 0827 04/30/17 0434 05/01/17 0533  NA 141 134* 129* 129* 132* 138  K 2.4* 3.0* 3.8 4.4 4.7 4.5  CL 92* 93* 88* 88* 92* 97*  CO2 31 29 27 29 30 28   GLUCOSE 110* 136* 132* 147* 132* 143*  BUN 32* 44* 66* 86* 99* 105*  CREATININE 0.88 1.02* 1.51* 1.76* 1.80* 1.46*  CALCIUM 9.4 8.5* 8.8* 8.8* 8.8* 8.8*  MG 1.6*  --  2.7*  --   --   --    GFR: Estimated Creatinine Clearance: 24.1 mL/min (A) (by C-G formula based on SCr of 1.46 mg/dL (H)). Liver Function Tests: Recent Labs  Lab 04/25/2017 1008  AST 32  ALT 32  ALKPHOS 87  BILITOT 4.2*  PROT 6.8  ALBUMIN 3.3*   No results for input(s): LIPASE, AMYLASE in the last 168 hours. No results for input(s): AMMONIA in the last 168 hours. Coagulation Profile: No results for input(s): INR, PROTIME in the last 168 hours. Cardiac Enzymes: Recent Labs  Lab 04/25/2017 1008  TROPONINI 0.13*   BNP (last 3 results) No results for input(s): PROBNP in the last 8760 hours. HbA1C: No results for input(s): HGBA1C in the last 72 hours. CBG: No results for input(s): GLUCAP in the last 168 hours. Lipid Profile: No results for input(s): CHOL, HDL, LDLCALC, TRIG, CHOLHDL, LDLDIRECT in the last 72 hours. Thyroid Function Tests: No results for input(s): TSH, T4TOTAL, FREET4, T3FREE, THYROIDAB in the last 72 hours. Anemia Panel: No results for input(s): VITAMINB12, FOLATE, FERRITIN, TIBC, IRON, RETICCTPCT in the last 72 hours. Sepsis Labs: Recent Labs  Lab 05/11/2017 1058 04/24/2017 1243  LATICACIDVEN 1.9  1.2    No results found for this or any previous visit (from the past 240 hour(s)).       Radiology Studies: No results found.      Scheduled Meds: . apixaban  2.5 mg Oral BID  . aspirin EC  81 mg Oral Daily  . atorvastatin  20 mg Oral q1800  . digoxin  0.125 mg Oral Daily  . donepezil  5 mg Oral QHS  . feeding supplement (ENSURE ENLIVE)  237 mL Oral BID BM  . levothyroxine  150 mcg Oral QAC breakfast  . magic mouthwash  5 mL Oral QID   And  . lidocaine  5 mL Mouth/Throat QID  . metoprolol tartrate  50 mg Oral BID  . multivitamin with minerals  1 tablet Oral Daily  . sodium chloride flush  3 mL Intravenous Q12H   Continuous Infusions: . sodium chloride    . cefTRIAXone (  ROCEPHIN)  IV Stopped (04/30/17 1400)     LOS: 5 days    Time spent: 30 minutes    Wallis Spizzirri Darleen Crocker, DO [facility] Pager [phone]  If 7PM-7AM, please contact night-coverage [email or web address] Password [facility] 05/01/2017, 4:32 PM

## 2017-05-01 NOTE — Progress Notes (Signed)
Physical Therapy Treatment Patient Details Name: Tina West MRN: [id number] DOB: 01/02/1929 Today's Date: 05/01/2017    History of Present Illness 84 year old woman admitted from home on 2/10 following a fall.  She has multiple medical comorbidities including systolic heart failure with an ejection fraction of 15%, had a cardiac catheterization in January with multi coronary artery disease and decision was made to treat medically.  She was found to have very poor living conditions at home and APS report has been opened.    PT Comments    Patient continues to be confused. Patient was positioned with head and neck significantly rotated to left side and complained of pain when attempted to passively reposition patient into a more neutral position. Patient's progress continues to be limited by complaints of pain and confused status. PT will continue to attempt further mobility and indepedence.    Follow Up Recommendations        Equipment Recommendations       Recommendations for Other Services       Precautions / Restrictions Precautions Precautions: Fall Restrictions Weight Bearing Restrictions: No    Mobility  Bed Mobility Overal bed mobility: Needs Assistance Bed Mobility: Rolling Rolling: Max assist         General bed mobility comments: max to total assist for positioning and basic bed mobiltiy  Transfers                 General transfer comment: did not attempt transfers today d/t patient's pain level, stiffness and difficulty moving; unsure of patient's actual PLOF before admission.  Ambulation/Gait             General Gait Details: did not attempt ambulation today d/t patient's pain level, stiffness and difficulty moving.   Stairs            Wheelchair Mobility    Modified Rankin (Stroke Patients Only)       Balance                                            Cognition Arousal/Alertness:  Awake/alert(confused) Behavior During Therapy: Anxious Overall Cognitive Status: Difficult to assess                                 General Comments: generally confused, required re-direction, talking to people not in room when PT entered room      Exercises General Exercises - Upper Extremity Shoulder Flexion: AAROM;Supine;Both;10 reps General Exercises - Lower Extremity Ankle Circles/Pumps: AAROM;10 reps;Supine;Both Heel Slides: AAROM;10 reps;Supine;Both Hip ABduction/ADduction: AAROM;10 reps;Both;Supine    General Comments        Pertinent Vitals/Pain Pain Assessment: Faces Pain Location: pain a poor historian Pain Descriptors / Indicators: Guarding;Grimacing Pain Intervention(s): Limited activity within patient's tolerance;Monitored during session    Home Living                      Prior Function            PT Goals (current goals can now be found in the care plan section)      Frequency           PT Plan Current plan remains appropriate    Co-evaluation              AM-PAC PT "6 Clicks" Daily  Activity  Outcome Measure                   End of Session   Activity Tolerance: Treatment limited secondary to medical complications (Comment) Patient left: in bed;with call bell/phone within reach;with bed alarm set Nurse Communication: Mobility status;Other (comment)       Time: 7035-0093 PT Time Calculation (min) (ACUTE ONLY): 30 min  Charges:  $Therapeutic Exercise: 23-37 mins                    G Codes:       Burns Timson D. Hartnett-Rands, MS, PT Per [place] [phone] 05/01/2017, 2:21 PM

## 2017-05-02 LAB — CBC
HEMATOCRIT: 34.5 % — AB (ref 36.0–46.0)
Hemoglobin: 10.3 g/dL — ABNORMAL LOW (ref 12.0–15.0)
MCH: 24.7 pg — ABNORMAL LOW (ref 26.0–34.0)
MCHC: 29.9 g/dL — AB (ref 30.0–36.0)
MCV: 82.7 fL (ref 78.0–100.0)
PLATELETS: 570 10*3/uL — AB (ref 150–400)
RBC: 4.17 MIL/uL (ref 3.87–5.11)
RDW: 17.3 % — AB (ref 11.5–15.5)
WBC: 17.4 10*3/uL — ABNORMAL HIGH (ref 4.0–10.5)

## 2017-05-02 LAB — BASIC METABOLIC PANEL
Anion gap: 11 (ref 5–15)
BUN: 96 mg/dL — ABNORMAL HIGH (ref 6–20)
CALCIUM: 8.5 mg/dL — AB (ref 8.9–10.3)
CO2: 30 mmol/L (ref 22–32)
CREATININE: 1.15 mg/dL — AB (ref 0.44–1.00)
Chloride: 105 mmol/L (ref 101–111)
GFR, EST AFRICAN AMERICAN: 48 mL/min — AB (ref >60)
GFR, EST NON AFRICAN AMERICAN: 42 mL/min — AB (ref >60)
Glucose, Bld: 142 mg/dL — ABNORMAL HIGH (ref 65–99)
Potassium: 4.9 mmol/L (ref 3.5–5.1)
Sodium: 146 mmol/L — ABNORMAL HIGH (ref 135–145)

## 2017-05-02 MED ORDER — DEXTROSE IN LACTATED RINGERS 5 % IV SOLN
INTRAVENOUS | Status: DC
  Administered 2017-05-02: 17:00:00 via INTRAVENOUS

## 2017-05-02 NOTE — Progress Notes (Addendum)
PROGRESS NOTE    Tina West  NFA:[id number] DOB: [birth year] DOA: 05/07/2017 PCP: Claretta Fraise, MD   Brief Narrative:   This is an 84 year old female who was admitted from home on 2/10 following a fall.  She was found to have very poor living conditions at home and an APS report has been opened.  She has been diagnosed with failure to thrive with severe protein calorie malnutrition as well as some dehydration in the setting of Alzheimer's dementia.  She has multiple medical comorbidities including systolic heart failure with an EF of 15%.  She is noted to remain confused and has had some mild hyponatremia which has corrected with IV fluid.  She is noted to have a UTI for which she has been started on IV Rocephin with urine cultures pending; GNR noted.   Assessment & Plan:   Principal Problem:   FTT (failure to thrive) in adult Active Problems:   Other specified hypothyroidism   Alzheimer's dementia   Hypothyroidism   Hypokalemia   Hypomagnesemia   Acute on chronic systolic CHF (congestive heart failure) (HCC)   Dehydration   Palliative care encounter   Pressure injury of skin   1. Failure to thrive/severe protein calorie malnutrition. Appreciate TTS evaluation to determine ability for decision-making and need for guardianship prior to placement.    Unfortunately, it appears that patient is having difficulty with a dysphagia 2 diet and will need further speech evaluation performed. 2. AK I with hyponatremia-improved.  Will keep on gentle D5LR due to poor intake. 3. UTI-GNR. Continue empirically on Rocephin. 4. Chronic systolic heart failure with EF 15-20%.  Currently appears volume depleted for which I will initiate gentle time limited IV fluid to treat AK I. 5. Alzheimer's dementia.  Continue Aricept. 6. Hypothyroidism.  Continue Synthroid at lower dose of 150 mcg. 7. History of atrial fibrillation.  Currently anticoagulated on Eliquis and rate controlled with  Lopressor. 8. Pressure injuries of skin to bilateral buttocks, lumbar spine, and feet.   DVT prophylaxis:Eliquis Code Status: Partial; ok with intubation Family Communication: None at bedside Disposition Plan: SNF after guardianship assessment   Consultants:   Palliative Care  Procedures:   None  Antimicrobials:   Rocephin 2/14->   Subjective: Patient seen and evaluated today with no new acute complaints or concerns. No acute concerns or events noted overnight. She is still confused and only minimally participates with PT. She is now having trouble with dysphagia.  Objective: Vitals:   05/01/17 2127 05/02/17 0531 05/02/17 0915 05/02/17 1328  BP: (!) 147/53 138/82 (!) 143/64 (!) 153/65  Pulse: 65 98 (!) 107 (!) 101  Resp:   17 18  Temp: 97.8 F (36.6 C)  98.5 F (36.9 C) 98.2 F (36.8 C)  TempSrc: Oral  Oral Oral  SpO2: 100% 97% 97% 100%  Weight:      Height:        Intake/Output Summary (Last 24 hours) at 05/02/2017 1347 Last data filed at 05/02/2017 1326 Gross per 24 hour  Intake 603 ml  Output 1750 ml  Net -1147 ml   Filed Weights   04/18/2017 1432 05/01/17 0649 05/01/17 1350  Weight: 53.5 kg (117 lb 15.1 oz) 55.1 kg (121 lb 7.6 oz) 59.8 kg (131 lb 13.4 oz)    Examination:  General exam: Appears calm and comfortable ; confused Respiratory system: Clear to auscultation. Respiratory effort normal. Cardiovascular system: S1 & S2 heard, RRR. No JVD, murmurs, rubs, gallops or clicks. No pedal edema. Gastrointestinal system: Abdomen  is nondistended, soft and nontender. No organomegaly or masses felt. Normal bowel sounds heard. Central nervous system: Alert and oriented. No focal neurological deficits. Extremities: Symmetric 5 x 5 power; dark yellow UO with foul odor noted Skin: No rashes, lesions or ulcers    Data Reviewed: I have personally reviewed following labs and imaging studies  CBC: Recent Labs  Lab 04/22/2017 1008 04/27/17 0619 04/28/17 0552  04/30/17 0434 05/01/17 0533 05/02/17 0716  WBC 25.3* 24.2* 22.4* 21.1* 24.6* 17.4*  NEUTROABS 23.7*  --   --   --   --   --   HGB 12.2 10.4* 11.2* 10.7* 11.2* 10.3*  HCT 40.8 35.0* 37.2 35.2* 36.5 34.5*  MCV 84.0 83.3 82.5 82.1 81.5 82.7  PLT 640* 511* 456* 423* 472* 371*   Basic Metabolic Panel: Recent Labs  Lab 05/05/2017 1008  04/28/17 0552 04/29/17 0827 04/30/17 0434 05/01/17 0533 05/02/17 0716  NA 141   < > 129* 129* 132* 138 146*  K 2.4*   < > 3.8 4.4 4.7 4.5 4.9  CL 92*   < > 88* 88* 92* 97* 105  CO2 31   < > 27 29 30 28 30   GLUCOSE 110*   < > 132* 147* 132* 143* 142*  BUN 32*   < > 66* 86* 99* 105* 96*  CREATININE 0.88   < > 1.51* 1.76* 1.80* 1.46* 1.15*  CALCIUM 9.4   < > 8.8* 8.8* 8.8* 8.8* 8.5*  MG 1.6*  --  2.7*  --   --   --   --    < > = values in this interval not displayed.   GFR: Estimated Creatinine Clearance: 33.2 mL/min (A) (by C-G formula based on SCr of 1.15 mg/dL (H)). Liver Function Tests: Recent Labs  Lab 05/01/2017 1008  AST 32  ALT 32  ALKPHOS 87  BILITOT 4.2*  PROT 6.8  ALBUMIN 3.3*   No results for input(s): LIPASE, AMYLASE in the last 168 hours. No results for input(s): AMMONIA in the last 168 hours. Coagulation Profile: No results for input(s): INR, PROTIME in the last 168 hours. Cardiac Enzymes: Recent Labs  Lab 04/25/2017 1008  TROPONINI 0.13*   BNP (last 3 results) No results for input(s): PROBNP in the last 8760 hours. HbA1C: No results for input(s): HGBA1C in the last 72 hours. CBG: No results for input(s): GLUCAP in the last 168 hours. Lipid Profile: No results for input(s): CHOL, HDL, LDLCALC, TRIG, CHOLHDL, LDLDIRECT in the last 72 hours. Thyroid Function Tests: No results for input(s): TSH, T4TOTAL, FREET4, T3FREE, THYROIDAB in the last 72 hours. Anemia Panel: No results for input(s): VITAMINB12, FOLATE, FERRITIN, TIBC, IRON, RETICCTPCT in the last 72 hours. Sepsis Labs: Recent Labs  Lab 04/17/2017 1058  05/05/2017 1243  LATICACIDVEN 1.9 1.2    Recent Results (from the past 240 hour(s))  Culture, Urine     Status: Abnormal (Preliminary result)   Collection Time: 04/30/17 11:07 AM  Result Value Ref Range Status   Specimen Description   Final    URINE, CLEAN CATCH Performed at [facility], [address], [place], [place] [address]    Special Requests   Final    NONE Performed at [facility], [address], [place], [place] [address]    Culture >=100,000 COLONIES/mL GRAM NEGATIVE RODS (A)  Final   Report Status PENDING  Incomplete         Radiology Studies: No results found.      Scheduled Meds: .  apixaban  2.5 mg Oral BID  . aspirin EC  81 mg Oral Daily  . atorvastatin  20 mg Oral q1800  . digoxin  0.125 mg Oral Daily  . donepezil  5 mg Oral QHS  . feeding supplement (ENSURE ENLIVE)  237 mL Oral BID BM  . levothyroxine  150 mcg Oral QAC breakfast  . magic mouthwash  5 mL Oral QID   And  . lidocaine  5 mL Mouth/Throat QID  . metoprolol tartrate  50 mg Oral BID  . multivitamin with minerals  1 tablet Oral Daily  . sodium chloride flush  3 mL Intravenous Q12H   Continuous Infusions: . sodium chloride    . cefTRIAXone (ROCEPHIN)  IV Stopped (05/01/17 2257)     LOS: 6 days    Time spent: 30 minutes    Ruhaan Nordahl Darleen Crocker, DO [facility] Pager [phone]  If 7PM-7AM, please contact night-coverage [email or web address] Password [email or web address] 05/02/2017, 1:47 PM

## 2017-05-03 LAB — BASIC METABOLIC PANEL
Anion gap: 12 (ref 5–15)
BUN: 76 mg/dL — AB (ref 6–20)
CHLORIDE: 113 mmol/L — AB (ref 101–111)
CO2: 31 mmol/L (ref 22–32)
CREATININE: 1.05 mg/dL — AB (ref 0.44–1.00)
Calcium: 8.7 mg/dL — ABNORMAL LOW (ref 8.9–10.3)
GFR calc Af Amer: 54 mL/min — ABNORMAL LOW (ref >60)
GFR calc non Af Amer: 47 mL/min — ABNORMAL LOW (ref >60)
GLUCOSE: 153 mg/dL — AB (ref 65–99)
Potassium: 4.5 mmol/L (ref 3.5–5.1)
Sodium: 156 mmol/L — ABNORMAL HIGH (ref 135–145)

## 2017-05-03 LAB — CBC
HEMATOCRIT: 35.2 % — AB (ref 36.0–46.0)
HEMOGLOBIN: 10.4 g/dL — AB (ref 12.0–15.0)
MCH: 24.9 pg — AB (ref 26.0–34.0)
MCHC: 29.5 g/dL — ABNORMAL LOW (ref 30.0–36.0)
MCV: 84.2 fL (ref 78.0–100.0)
Platelets: 806 10*3/uL — ABNORMAL HIGH (ref 150–400)
RBC: 4.18 MIL/uL (ref 3.87–5.11)
RDW: 17.5 % — ABNORMAL HIGH (ref 11.5–15.5)
WBC: 17.7 10*3/uL — ABNORMAL HIGH (ref 4.0–10.5)

## 2017-05-03 MED ORDER — DEXTROSE 5 % IV SOLN
INTRAVENOUS | Status: DC
  Administered 2017-05-03: 09:00:00 via INTRAVENOUS

## 2017-05-03 NOTE — Progress Notes (Signed)
Pharmacy Antibiotic Note  Tina West is a 84 y.o. female admitted on 05/04/2017 with UTI.  Pharmacy has been consulted for ceftriaxone dosing.  Plan: Ceftriaxone 1gm IV q24h  F/U cxs and clinical progress Monitor V/S, labs  Height: 5\' 9"  (175.3 cm) Weight: 131 lb 13.4 oz (59.8 kg) IBW/kg (Calculated) : 66.2  Temp (24hrs), Avg:98.9 F (37.2 C), Min:98.4 F (36.9 C), Max:99.9 F (37.7 C)  Recent Labs  Lab 04/28/17 0552 04/29/17 0827 04/30/17 0434 05/01/17 0533 05/02/17 0716 05/03/17 0336  WBC 22.4*  --  21.1* 24.6* 17.4* 17.7*  CREATININE 1.51* 1.76* 1.80* 1.46* 1.15* 1.05*    Estimated Creatinine Clearance: 36.3 mL/min (A) (by C-G formula based on SCr of 1.05 mg/dL (H)).    Allergies  Allergen Reactions  . Codeine Hives  . Contrast Media [Iodinated Diagnostic Agents] Other (See Comments)    unspecified  . Diazepam Hives  . Meperidine Hcl Other (See Comments)    unspecified  . Morphine Other (See Comments)    "caused heart attack"    Antimicrobials this admission: Ceftriaxone 2/14>>   Microbiology results: 2/14 UCx: pending: gram negative rods  Thank you for allowing pharmacy to be a part of this patient's care.   Margot Ables, PharmD Clinical Pharmacist 05/03/2017 1:49 PM

## 2017-05-03 NOTE — Progress Notes (Signed)
PROGRESS NOTE    Tina West  SRP:[id number] DOB: Apr 30, 1928 DOA: 05/14/2017 PCP: Claretta Fraise, MD   Brief Narrative:   This is an 84 year old female who was admitted from home on 2/10 following a fall.  She was found to have very poor living conditions at home and an APS report has been opened.  She has been diagnosed with failure to thrive with severe protein calorie malnutrition as well as some dehydration in the setting of Alzheimer's dementia.  She has multiple medical comorbidities including systolic heart failure with an EF of 15%.  She was initially noted to have some hyponatremia that has now switched to hypernatremia.  Additionally, she appears to have poor swallow capacity and will need speech evaluation.  She is noted to have a UTI for which she has been started on IV Rocephin with urine cultures pending; GNR noted.   Assessment & Plan:   Principal Problem:   FTT (failure to thrive) in adult Active Problems:   Other specified hypothyroidism   Alzheimer's dementia   Hypothyroidism   Hypokalemia   Hypomagnesemia   Acute on chronic systolic CHF (congestive heart failure) (HCC)   Dehydration   Palliative care encounter   Pressure injury of skin   1. Failure to thrive/severe protein calorie malnutrition with concurrent dysphagia. SLP evaluation for further dietary recommendations. 2. AK I-improved, but now with hypernatremia.  Will keep on gentle D5W. 3. UTI-GNR. Continue empirically on Rocephin. Final cultures pending. 4. Chronic systolic heart failure with EF 15-20%.  Currently appears volume depleted for which I will initiate gentle time limited IV fluid to treat AK I. 5. Alzheimer's dementia.  Continue Aricept. 6. Hypothyroidism.  Continue Synthroid at lower dose of 150 mcg. 7. History of atrial fibrillation.  Currently anticoagulated on Eliquis and rate controlled with Lopressor. 8. Pressure injuries of skin to bilateral buttocks, lumbar spine, and feet.   DVT  prophylaxis:Eliquis Code Status: Partial; ok with intubation Family Communication: None at bedside Disposition Plan: SNF after guardianship assessment   Consultants:   Palliative Care  Procedures:   None  Antimicrobials:   Rocephin 2/14->   Subjective: Patient seen and evaluated today with no new acute complaints or concerns. No acute concerns or events noted overnight. She is still confused and continues to moan constantly at the bedside.  She could not tolerate her dysphasia diet yesterday.  She is now hypernatremic.  Objective: Vitals:   05/02/17 1328 05/02/17 1800 05/02/17 2108 05/03/17 0502  BP: (!) 153/65 136/72 (!) 142/79 (!) 173/88  Pulse: (!) 101 (!) 101 (!) 110 (!) 102  Resp: 18 18 18 18   Temp: 98.2 F (36.8 C) 98.5 F (36.9 C) 99.9 F (37.7 C) 98.4 F (36.9 C)  TempSrc: Oral Oral Axillary Oral  SpO2: 100% 100% 97% 100%  Weight:      Height:        Intake/Output Summary (Last 24 hours) at 05/03/2017 1226 Last data filed at 05/03/2017 0800 Gross per 24 hour  Intake 100 ml  Output 1550 ml  Net -1450 ml   Filed Weights   04/29/2017 1432 05/01/17 0649 05/01/17 1350  Weight: 53.5 kg (117 lb 15.1 oz) 55.1 kg (121 lb 7.6 oz) 59.8 kg (131 lb 13.4 oz)    Examination:  General exam: Appears calm and comfortable ; confused Respiratory system: Clear to auscultation. Respiratory effort normal. Cardiovascular system: S1 & S2 heard, RRR. No JVD, murmurs, rubs, gallops or clicks. No pedal edema. Gastrointestinal system: Abdomen is nondistended, soft and  nontender. No organomegaly or masses felt. Normal bowel sounds heard. Central nervous system: Alert and oriented. No focal neurological deficits. Extremities: Symmetric 5 x 5 power; dark yellow UO with foul odor noted Skin: No rashes, lesions or ulcers    Data Reviewed: I have personally reviewed following labs and imaging studies  CBC: Recent Labs  Lab 04/28/17 0552 04/30/17 0434 05/01/17 0533  05/02/17 0716 05/03/17 0336  WBC 22.4* 21.1* 24.6* 17.4* 17.7*  HGB 11.2* 10.7* 11.2* 10.3* 10.4*  HCT 37.2 35.2* 36.5 34.5* 35.2*  MCV 82.5 82.1 81.5 82.7 84.2  PLT 456* 423* 472* 570* 841*   Basic Metabolic Panel: Recent Labs  Lab 04/28/17 0552 04/29/17 0827 04/30/17 0434 05/01/17 0533 05/02/17 0716 05/03/17 0336  NA 129* 129* 132* 138 146* 156*  K 3.8 4.4 4.7 4.5 4.9 4.5  CL 88* 88* 92* 97* 105 113*  CO2 27 29 30 28 30 31   GLUCOSE 132* 147* 132* 143* 142* 153*  BUN 66* 86* 99* 105* 96* 76*  CREATININE 1.51* 1.76* 1.80* 1.46* 1.15* 1.05*  CALCIUM 8.8* 8.8* 8.8* 8.8* 8.5* 8.7*  MG 2.7*  --   --   --   --   --    GFR: Estimated Creatinine Clearance: 36.3 mL/min (A) (by C-G formula based on SCr of 1.05 mg/dL (H)). Liver Function Tests: No results for input(s): AST, ALT, ALKPHOS, BILITOT, PROT, ALBUMIN in the last 168 hours. No results for input(s): LIPASE, AMYLASE in the last 168 hours. No results for input(s): AMMONIA in the last 168 hours. Coagulation Profile: No results for input(s): INR, PROTIME in the last 168 hours. Cardiac Enzymes: No results for input(s): CKTOTAL, CKMB, CKMBINDEX, TROPONINI in the last 168 hours. BNP (last 3 results) No results for input(s): PROBNP in the last 8760 hours. HbA1C: No results for input(s): HGBA1C in the last 72 hours. CBG: No results for input(s): GLUCAP in the last 168 hours. Lipid Profile: No results for input(s): CHOL, HDL, LDLCALC, TRIG, CHOLHDL, LDLDIRECT in the last 72 hours. Thyroid Function Tests: No results for input(s): TSH, T4TOTAL, FREET4, T3FREE, THYROIDAB in the last 72 hours. Anemia Panel: No results for input(s): VITAMINB12, FOLATE, FERRITIN, TIBC, IRON, RETICCTPCT in the last 72 hours. Sepsis Labs: Recent Labs  Lab 04/30/2017 1243  LATICACIDVEN 1.2    Recent Results (from the past 240 hour(s))  Culture, Urine     Status: Abnormal (Preliminary result)   Collection Time: 04/30/17 11:07 AM  Result Value Ref  Range Status   Specimen Description   Final    URINE, CLEAN CATCH Performed at [facility], [address] [place], [place] [address]    Special Requests   Final    NONE Performed at [facility], [address], [place], [place] [address]    Culture (A)  Final    >=100,000 COLONIES/mL GRAM NEGATIVE RODS IDENTIFICATION AND SUSCEPTIBILITIES TO FOLLOW Performed at [facility] [address] [place], [place] [address]    Report Status PENDING  Incomplete         Radiology Studies: No results found.      Scheduled Meds: . apixaban  2.5 mg Oral BID  . aspirin EC  81 mg Oral Daily  . atorvastatin  20 mg Oral q1800  . digoxin  0.125 mg Oral Daily  . donepezil  5 mg Oral QHS  . feeding supplement (ENSURE ENLIVE)  237 mL Oral BID BM  . levothyroxine  150 mcg Oral QAC breakfast  . magic mouthwash  5 mL Oral QID   And  . lidocaine  5 mL Mouth/Throat QID  . metoprolol tartrate  50 mg Oral BID  . multivitamin with minerals  1 tablet Oral Daily  . sodium chloride flush  3 mL Intravenous Q12H   Continuous Infusions: . sodium chloride    . cefTRIAXone (ROCEPHIN)  IV Stopped (05/02/17 2015)  . dextrose 50 mL/hr at 05/03/17 0916     LOS: 7 days    Time spent: 30 minutes    Daxx Tiggs Darleen Crocker, DO [facility] Pager [phone]  If 7PM-7AM, please contact night-coverage [email or web address] Password [facility] 05/03/2017, 12:26 PM

## 2017-05-04 ENCOUNTER — Inpatient Hospital Stay (HOSPITAL_COMMUNITY): Payer: Medicare PPO

## 2017-05-04 LAB — BASIC METABOLIC PANEL
Anion gap: 12 (ref 5–15)
Anion gap: 9 (ref 5–15)
BUN: 57 mg/dL — ABNORMAL HIGH (ref 6–20)
BUN: 51 mg/dL — AB (ref 6–20)
CALCIUM: 8.5 mg/dL — AB (ref 8.9–10.3)
CALCIUM: 8.2 mg/dL — AB (ref 8.9–10.3)
CHLORIDE: 119 mmol/L — AB (ref 101–111)
CO2: 31 mmol/L (ref 22–32)
CO2: 31 mmol/L (ref 22–32)
CREATININE: 0.87 mg/dL (ref 0.44–1.00)
CREATININE: 0.89 mg/dL (ref 0.44–1.00)
Chloride: 118 mmol/L — ABNORMAL HIGH (ref 101–111)
GFR calc Af Amer: >60 mL/min (ref >60)
GFR calc Af Amer: >60 mL/min (ref >60)
GFR, EST NON AFRICAN AMERICAN: 59 mL/min — AB (ref >60)
GFR, EST NON AFRICAN AMERICAN: 57 mL/min — AB (ref >60)
GLUCOSE: 158 mg/dL — AB (ref 65–99)
Glucose, Bld: 148 mg/dL — ABNORMAL HIGH (ref 65–99)
Potassium: 4.5 mmol/L (ref 3.5–5.1)
Potassium: 4 mmol/L (ref 3.5–5.1)
SODIUM: 162 mmol/L — AB (ref 135–145)
Sodium: 158 mmol/L — ABNORMAL HIGH (ref 135–145)

## 2017-05-04 LAB — CBC
HCT: 34.4 % — ABNORMAL LOW (ref 36.0–46.0)
HEMOGLOBIN: 9.9 g/dL — AB (ref 12.0–15.0)
MCH: 24.9 pg — AB (ref 26.0–34.0)
MCHC: 28.8 g/dL — ABNORMAL LOW (ref 30.0–36.0)
MCV: 86.4 fL (ref 78.0–100.0)
PLATELETS: 927 10*3/uL — AB (ref 150–400)
RBC: 3.98 MIL/uL (ref 3.87–5.11)
RDW: 17.8 % — AB (ref 11.5–15.5)
WBC: 18.8 10*3/uL — ABNORMAL HIGH (ref 4.0–10.5)

## 2017-05-04 LAB — URINE CULTURE: Culture: >=100000 — AB

## 2017-05-04 LAB — PATHOLOGIST SMEAR REVIEW

## 2017-05-04 LAB — OSMOLALITY: OSMOLALITY: 346 mosm/kg — AB (ref 275–295)

## 2017-05-04 MED ORDER — LORAZEPAM 2 MG/ML IJ SOLN
1.0000 mg | Freq: Four times a day (QID) | INTRAMUSCULAR | Status: DC | PRN
  Administered 2017-05-04 – 2017-05-13 (×4): 1 mg via INTRAVENOUS
  Filled 2017-05-04 (×4): qty 1

## 2017-05-04 MED ORDER — FREE WATER
400.0000 mL | Freq: Three times a day (TID) | Status: DC
  Administered 2017-05-04 (×2): 400 mL

## 2017-05-04 MED ORDER — JEVITY 1.2 CAL PO LIQD
1000.0000 mL | ORAL | Status: DC
  Administered 2017-05-04: 1000 mL
  Filled 2017-05-04 (×6): qty 1000

## 2017-05-04 MED ORDER — HYDRALAZINE HCL 20 MG/ML IJ SOLN
5.0000 mg | INTRAMUSCULAR | Status: DC | PRN
  Administered 2017-05-04: 5 mg via INTRAVENOUS
  Filled 2017-05-04: qty 1

## 2017-05-04 MED ORDER — DEXTROSE 5 % IV SOLN
INTRAVENOUS | Status: DC
  Administered 2017-05-04 – 2017-05-07 (×5): via INTRAVENOUS

## 2017-05-04 NOTE — Progress Notes (Signed)
PROGRESS NOTE    Tina West  ZJI:[id number] DOB: Sep 03, 1928 DOA: 05/12/2017 PCP: Claretta Fraise, MD   Brief Narrative:   This is an 84 year old female who was admitted from home on 2/10 following a fall.  She was found to have very poor living conditions at home and an APS report has been opened.  She has been diagnosed with failure to thrive with severe protein calorie malnutrition as well as some dehydration in the setting of Alzheimer's dementia.  She has multiple medical comorbidities including systolic heart failure with an EF of 15%.  She was initially noted to have some hyponatremia that has now switched to hypernatremia.  Additionally, she appears to have poor swallow capacity and will need speech evaluation.  She is noted to have a UTI for which she has been started on IV Rocephin with urine notable for E. coli and Proteus.  She continues to have worsening hypernatremia with high urine output that is suspicious for diabetes insipidus.   Assessment & Plan:   Principal Problem:   FTT (failure to thrive) in adult Active Problems:   Other specified hypothyroidism   Alzheimer's dementia   Hypothyroidism   Hypokalemia   Hypomagnesemia   Acute on chronic systolic CHF (congestive heart failure) (HCC)   Dehydration   Palliative care encounter   Pressure injury of skin   1. Failure to thrive/severe protein calorie malnutrition with concurrent dysphagia. SLP evaluation for further dietary recommendations.  Dietary consulted for recommendations for NG tube feeds.  Appreciate general surgery for placement of NG tube which appears appropriately positioned. 2. Severe hypernatremia suspicious for DI.  Increase D5 water to 125 cc/h.  NG tube placed with free water flushes ordered.  Urine and serum osmolarity with potential need for desmopressin.  Continue to monitor with repeat BMP. 3. AK I-improved.  Continue on fluids as noted above. 4. UTI-E Coli and Proteus. Continue on Rocephin day  5/7.  5. Chronic systolic heart failure with EF 15-20%.  Currently appears volume depleted for which I will initiate gentle time limited IV fluid to treat AK I. 6. Alzheimer's dementia.  Continue Aricept. 7. Hypothyroidism.  Continue Synthroid at lower dose of 150 mcg. 8. History of atrial fibrillation.  Currently anticoagulated on Eliquis and rate controlled with Lopressor. 9. Pressure injuries of skin to bilateral buttocks, lumbar spine, and feet.   DVT prophylaxis:Eliquis Code Status: Partial; ok with intubation Family Communication: None at bedside Disposition Plan: Patient has guardianship with the department of social services and is eligible to go to [facility] in [place] once she is stable for discharge.   Consultants:   Palliative Care  Procedures:   None  Antimicrobials:   Rocephin 2/14->   Subjective: Patient seen and evaluated today with no new acute complaints or concerns.  She has not been able to swallow and hypernatremia has been worsening for which NG tube has been placed by general surgery.  Objective: Vitals:   05/03/17 1513 05/03/17 2107 05/04/17 0457 05/04/17 0600  BP: (!) 149/54 (!) 162/81 (!) 176/102 (!) 139/95  Pulse: 73 86 69   Resp: 18 18    Temp: 97.9 F (36.6 C) 98.2 F (36.8 C) 97.8 F (36.6 C)   TempSrc: Oral Oral Axillary   SpO2: 96% 95% 98%   Weight:      Height:        Intake/Output Summary (Last 24 hours) at 05/04/2017 1334 Last data filed at 05/04/2017 0631 Gross per 24 hour  Intake 527.5 ml  Output 1150 ml  Net -622.5 ml   Filed Weights   05/04/2017 1432 05/01/17 0649 05/01/17 1350  Weight: 53.5 kg (117 lb 15.1 oz) 55.1 kg (121 lb 7.6 oz) 59.8 kg (131 lb 13.4 oz)    Examination:  General exam: Appears calm and comfortable ; confused Respiratory system: Clear to auscultation. Respiratory effort normal. Cardiovascular system: S1 & S2 heard, RRR. No JVD, murmurs, rubs, gallops or clicks. No pedal edema. Gastrointestinal  system: Abdomen is nondistended, soft and nontender. No organomegaly or masses felt. Normal bowel sounds heard. Central nervous system: Alert and oriented. No focal neurological deficits. Extremities: Symmetric 5 x 5 power; dark yellow UO with foul odor noted Skin: No rashes, lesions or ulcers    Data Reviewed: I have personally reviewed following labs and imaging studies  CBC: Recent Labs  Lab 04/30/17 0434 05/01/17 0533 05/02/17 0716 05/03/17 0336 05/04/17 0720  WBC 21.1* 24.6* 17.4* 17.7* 18.8*  HGB 10.7* 11.2* 10.3* 10.4* 9.9*  HCT 35.2* 36.5 34.5* 35.2* 34.4*  MCV 82.1 81.5 82.7 84.2 86.4  PLT 423* 472* 570* 806* 675*   Basic Metabolic Panel: Recent Labs  Lab 04/28/17 0552  04/30/17 0434 05/01/17 0533 05/02/17 0716 05/03/17 0336 05/04/17 0720  NA 129*   < > 132* 138 146* 156* 162*  K 3.8   < > 4.7 4.5 4.9 4.5 4.5  CL 88*   < > 92* 97* 105 113* 119*  CO2 27   < > 30 28 30 31 31   GLUCOSE 132*   < > 132* 143* 142* 153* 148*  BUN 66*   < > 99* 105* 96* 76* 57*  CREATININE 1.51*   < > 1.80* 1.46* 1.15* 1.05* 0.87  CALCIUM 8.8*   < > 8.8* 8.8* 8.5* 8.7* 8.5*  MG 2.7*  --   --   --   --   --   --    < > = values in this interval not displayed.   GFR: Estimated Creatinine Clearance: 43.8 mL/min (by C-G formula based on SCr of 0.87 mg/dL). Liver Function Tests: No results for input(s): AST, ALT, ALKPHOS, BILITOT, PROT, ALBUMIN in the last 168 hours. No results for input(s): LIPASE, AMYLASE in the last 168 hours. No results for input(s): AMMONIA in the last 168 hours. Coagulation Profile: No results for input(s): INR, PROTIME in the last 168 hours. Cardiac Enzymes: No results for input(s): CKTOTAL, CKMB, CKMBINDEX, TROPONINI in the last 168 hours. BNP (last 3 results) No results for input(s): PROBNP in the last 8760 hours. HbA1C: No results for input(s): HGBA1C in the last 72 hours. CBG: No results for input(s): GLUCAP in the last 168 hours. Lipid Profile: No  results for input(s): CHOL, HDL, LDLCALC, TRIG, CHOLHDL, LDLDIRECT in the last 72 hours. Thyroid Function Tests: No results for input(s): TSH, T4TOTAL, FREET4, T3FREE, THYROIDAB in the last 72 hours. Anemia Panel: No results for input(s): VITAMINB12, FOLATE, FERRITIN, TIBC, IRON, RETICCTPCT in the last 72 hours. Sepsis Labs: No results for input(s): PROCALCITON, LATICACIDVEN in the last 168 hours.  Recent Results (from the past 240 hour(s))  Culture, Urine     Status: Abnormal   Collection Time: 04/30/17 11:07 AM  Result Value Ref Range Status   Specimen Description   Final    URINE, CLEAN CATCH Performed at [facility], [address], [place], [place] [address]    Special Requests   Final    NONE Performed at [facility], [address], [place], [place]  [address]    Culture (A)  Final    >=100,000 COLONIES/mL ESCHERICHIA COLI 50,000 COLONIES/mL PROTEUS MIRABILIS    Report Status 05/04/2017 FINAL  Final   Organism ID, Bacteria ESCHERICHIA COLI (A)  Final   Organism ID, Bacteria PROTEUS MIRABILIS (A)  Final      Susceptibility   Escherichia coli - MIC*    AMPICILLIN 8 SENSITIVE Sensitive     CEFAZOLIN <=4 SENSITIVE Sensitive     CEFTRIAXONE <=1 SENSITIVE Sensitive     CIPROFLOXACIN >=4 RESISTANT Resistant     GENTAMICIN <=1 SENSITIVE Sensitive     IMIPENEM <=0.25 SENSITIVE Sensitive     NITROFURANTOIN <=16 SENSITIVE Sensitive     TRIMETH/SULFA <=20 SENSITIVE Sensitive     AMPICILLIN/SULBACTAM 4 SENSITIVE Sensitive     PIP/TAZO <=4 SENSITIVE Sensitive     Extended ESBL NEGATIVE Sensitive     * >=100,000 COLONIES/mL ESCHERICHIA COLI   Proteus mirabilis - MIC*    AMPICILLIN <=2 SENSITIVE Sensitive     CEFAZOLIN <=4 SENSITIVE Sensitive     CEFTRIAXONE <=1 SENSITIVE Sensitive     CIPROFLOXACIN <=0.25 SENSITIVE Sensitive     GENTAMICIN <=1 SENSITIVE Sensitive     IMIPENEM 2 SENSITIVE Sensitive     NITROFURANTOIN 128 RESISTANT Resistant     TRIMETH/SULFA <=20 SENSITIVE  Sensitive     AMPICILLIN/SULBACTAM <=2 SENSITIVE Sensitive     PIP/TAZO <=4 SENSITIVE Sensitive     * 50,000 COLONIES/mL PROTEUS MIRABILIS         Radiology Studies: Dg Chest Port 1 View  Result Date: 05/04/2017 CLINICAL DATA:  NG tube placement EXAM: PORTABLE CHEST 1 VIEW COMPARISON:  04/19/2017 FINDINGS: Minimal blunting of the left costophrenic angle, raising the possibility of a trace left pleural effusion. No frank interstitial edema. No pneumothorax. The heart is top-normal in size. Enteric tube terminates in the proximal gastric body. IMPRESSION: Enteric tube terminates in the proximal gastric body. Electronically Signed   By: Julian Hy M.D.   On: 05/04/2017 12:59        Scheduled Meds: . apixaban  2.5 mg Oral BID  . aspirin EC  81 mg Oral Daily  . atorvastatin  20 mg Oral q1800  . digoxin  0.125 mg Oral Daily  . donepezil  5 mg Oral QHS  . feeding supplement (ENSURE ENLIVE)  237 mL Oral BID BM  . free water  400 mL Per Tube Q8H  . levothyroxine  150 mcg Oral QAC breakfast  . magic mouthwash  5 mL Oral QID   And  . lidocaine  5 mL Mouth/Throat QID  . metoprolol tartrate  50 mg Oral BID  . multivitamin with minerals  1 tablet Oral Daily  . sodium chloride flush  3 mL Intravenous Q12H   Continuous Infusions: . sodium chloride    . cefTRIAXone (ROCEPHIN)  IV Stopped (05/03/17 2200)  . dextrose 125 mL/hr at 05/04/17 1048     LOS: 8 days    Time spent: 30 minutes    Pratik Darleen Crocker, DO [facility] Pager [phone]  If 7PM-7AM, please contact night-coverage [email or web address] Password [email or web address] 05/04/2017, 1:34 PM

## 2017-05-04 NOTE — Progress Notes (Signed)
[facility]  NG tube placed by Dr. Arnoldo Morale. If any additional needs, please let surgery know.   Signing off.   Curlene Labrum, MD [facility] [address] [place], [place] [address] [phone] (office)

## 2017-05-04 NOTE — Clinical Social Work Note (Signed)
DSS is going for guardianship of patient today at 4:00 p.m.  [facility] will accept patient.    Nikolette Reindl, Clydene Pugh, LCSW

## 2017-05-04 NOTE — Progress Notes (Signed)
CRITICAL VALUE ALERT  Critical Value:  Platelets 927 & Sodium 156.  Date & Time Notied:  05/04/17 @ 0900  Provider Notified: Manuella Ghazi, MD.  Orders Received/Actions taken: Continue to monitor.

## 2017-05-04 NOTE — Progress Notes (Signed)
NG tube placed by Dr. Arnoldo Morale. X-ray ordered to verify placement. Will continue to monitor.

## 2017-05-04 NOTE — Progress Notes (Signed)
Two RN's attempted to insert NG tube into both nostrils. Unsuccessful, met resistance in both. MD made aware, states he will put in a GI consult to see if they are able to insert the tube.

## 2017-05-04 NOTE — Progress Notes (Signed)
Pt started on tube feeding via NG tube. At 11:35 pm the feeding needs to be bumped up 10 mL, and up 10 mL every 8 hours until goal of 22mL/hr is reached.

## 2017-05-04 NOTE — Evaluation (Signed)
Clinical/Bedside Swallow Evaluation Patient Details  Name: Tina West MRN: [id number] Date of Birth: [birth year]  Today's Date: 05/04/2017 Time: SLP Start Time (ACUTE ONLY): 1855 SLP Stop Time (ACUTE ONLY): 1918 SLP Time Calculation (min) (ACUTE ONLY): 23 min  Past Medical History:  Past Medical History:  Diagnosis Date  . A-fib ([place])   . Abdominal cyst    Multiple surgeries in [place]. "Heart Stopped" on the operating table during one of her "cyst" operations, probably 15-20 years ago  . Anxiety   . Dementia   . Diastolic CHF, acute (HCC)    BNP 179 in 1/11. Echo (1/11) with EF 60%, mild focal basal septal hypertrophy, grade I diastolic dysfunction, mild left atrial enlargement.   Marland Kitchen HTN (hypertension)   . Hypothyroidism   . Obesity   . PAD (peripheral artery disease) ([place]) 03/16/2017  . S/P TAH (total abdominal hysterectomy)    Past Surgical History:  Past Surgical History:  Procedure Laterality Date  . ABDOMINAL HYSTERECTOMY    . CHOLECYSTECTOMY    . HERNIA REPAIR    . RIGHT/LEFT HEART CATH AND CORONARY ANGIOGRAPHY N/A 03/18/2017   Procedure: RIGHT/LEFT HEART CATH AND CORONARY ANGIOGRAPHY;  Surgeon: Jettie Booze, MD;  Location: [facility];  Service: Cardiovascular;  Laterality: N/A;   HPI:  This is an 84 year old female who was admitted from home on 2/10 following a fall.  She was found to have very poor living conditions at home and an APS report has been opened.  She has been diagnosed with failure to thrive with severe protein calorie malnutrition as well as some dehydration in the setting of Alzheimer's dementia.  She has multiple medical comorbidities including systolic heart failure with an EF of 15%.  She was initially noted to have some hyponatremia that has now switched to hypernatremia.  Additionally, she appears to have poor swallow capacity and will need speech evaluation.  She is noted to have a UTI for which she has been started on IV Rocephin with  urine notable for E. coli and Proteus.  She continues to have worsening hypernatremia with high urine output that is suspicious for diabetes insipidus. BSE requested.   Assessment / Plan / Recommendation Clinical Impression  Clinical swallow evaluation completed with NG tube in place. Pt moaning and voicing unintelligibly. Oral care attempted, however Pt extremely defensive with any slight movement to NG. Pt does attempt to respond with yes/no appropriately. Pt voiced pain with NG and bloody secretions removed from nasal passage. Pt voice, "yes" when asked if she would like water/ice. Pt with poor labial closure and poor oral manipulation of ice chip. Small amount of water placed in left buccal cavity and eventual swallow elicited. Pt unable to sustain appropriate level of alertness for safe po trials at this time. Continue NPO with SLP to follow during acute stay. Unsure what goals of care are at this time with NG in place. Pt would likely be more appropriate for careful hand feeding when willing to accept given advanced dementia.   SLP Visit Diagnosis: Dysphagia, oropharyngeal phase (R13.12)    Aspiration Risk  Moderate aspiration risk;Risk for inadequate nutrition/hydration    Diet Recommendation NPO   Medication Administration: Via alternative means    Other  Recommendations Oral Care Recommendations: Oral care QID;Staff/trained caregiver to provide oral care   Follow up Recommendations Skilled Nursing facility      Frequency and Duration min 2x/week  1 week       Prognosis Prognosis for Safe Diet  Advancement: Guarded Barriers to Reach Goals: Cognitive deficits;Behavior      Swallow Study   General Date of Onset: 04/27/2017 HPI: This is an 84 year old female who was admitted from home on 2/10 following a fall.  She was found to have very poor living conditions at home and an APS report has been opened.  She has been diagnosed with failure to thrive with severe protein calorie  malnutrition as well as some dehydration in the setting of Alzheimer's dementia.  She has multiple medical comorbidities including systolic heart failure with an EF of 15%.  She was initially noted to have some hyponatremia that has now switched to hypernatremia.  Additionally, she appears to have poor swallow capacity and will need speech evaluation.  She is noted to have a UTI for which she has been started on IV Rocephin with urine notable for E. coli and Proteus.  She continues to have worsening hypernatremia with high urine output that is suspicious for diabetes insipidus. BSE requested. Type of Study: Bedside Swallow Evaluation Previous Swallow Assessment: none on record Diet Prior to this Study: NPO Temperature Spikes Noted: No Respiratory Status: Nasal cannula History of Recent Intubation: No Behavior/Cognition: Confused;Lethargic/Drowsy Oral Cavity Assessment: Dry;Dried secretions Oral Care Completed by SLP: Yes Oral Cavity - Dentition: Poor condition Vision: Impaired for self-feeding Self-Feeding Abilities: Total assist Patient Positioning: Upright in bed Baseline Vocal Quality: Normal Volitional Cough: Cognitively unable to elicit Volitional Swallow: Unable to elicit    Oral/Motor/Sensory Function Overall Oral Motor/Sensory Function: Generalized oral weakness   Ice Chips Ice chips: Impaired Presentation: Spoon Oral Phase Impairments: Reduced lingual movement/coordination;Reduced labial seal;Poor awareness of bolus Oral Phase Functional Implications: Left anterior spillage;Oral residue;Prolonged oral transit Pharyngeal Phase Impairments: Suspected delayed Swallow   Thin Liquid Thin Liquid: Impaired Presentation: Spoon Oral Phase Impairments: Reduced labial seal;Reduced lingual movement/coordination;Poor awareness of bolus Oral Phase Functional Implications: Left anterior spillage;Oral holding;Oral residue Pharyngeal  Phase Impairments: Suspected delayed Swallow;Decreased  hyoid-laryngeal movement    Nectar Thick Nectar Thick Liquid: Not tested   Honey Thick Honey Thick Liquid: Not tested   Puree Puree: Not tested   Solid   Thank you,  Genene Churn, CCC-SLP [phone]    Solid: Not tested        Tina West 05/04/2017,7:40 PM

## 2017-05-04 NOTE — Progress Notes (Signed)
Nutrition Follow up  INTERVENTION:  Start continuous via NGT- Jevity 1.2 @ 20 ml/hr and advance rate 10 ml q 8 hrs to goal of 55 ml/hr to meet est needs. Provides- 1584 kcal, 73 gr protein and 1065 ml water.  Free water 400 ml q 8 hrs.   IV fluids: D5 @ 125 ml/hr   NUTRITION DIAGNOSIS:  Malnutrition (severe) related to abhorrent living conditions (PTA) swallow difficulty, chronic dementia as evidenced by social and medical hx, swallow difficulty, cognitive impairment, moderate muscle- energy intake </= 50% for  >/= 5 days.  GOAL: Pt to meet >/= 90% of their estimated nutrition needs    Patient will meet greater than or equal to 90% of their needs   MONITOR: Po intake, labs, skin assessements and wt trends  PO intake, Supplement acceptance, Skin, Weight trends, Labs  ASSESSMENT:  The patient presents after a fall at home. Lives with son. FTT, Dementia,  HF with EF- 15%. Very poor living conditions and APS report made. The patient is eating lunch during RD visit. she is trying to eat but having some difficulty removed her chicken from bone to make it easier for her to eat. At home she reportedly eats a peanut butter sandwich for breakfast and a banana for lunch. Pt says they do not have much money to buy food. Discharge disposition unknown at this time. The patient is unable to prepare her own food and says she only gets up occasionally. She has a hx of Dementia and her baseline cognitive function is unknown. Pursuit of guardianship in process and transfer to SNF if possible.  2/18- Patient po nutrition intake poor since admission 0-25% pureed foods- noted she is having swallow difficulty. Pocketing her food per nursing. NGT inserted by Dr. Arnoldo Morale and position confirmed. TF recommendations provided above.  At risk for refeeding given her compromised nutritional status.  Verbal ok from MD received to start tube feeds.  Her weight has increased severely (12%) the past week from 117.15 up to  131.13 lb.     Meds: Aricept, MVI   Labs: sodium 162, Potassium, Bun-57 and Mag 2.7, glucose 148.  Recent Labs  Lab 04/28/17 0552  05/02/17 0716 05/03/17 0336 05/04/17 0720  NA 129*   < > 146* 156* 162*  K 3.8   < > 4.9 4.5 4.5  CL 88*   < > 105 113* 119*  CO2 27   < > 30 31 31   BUN 66*   < > 96* 76* 57*  CREATININE 1.51*   < > 1.15* 1.05* 0.87  CALCIUM 8.8*   < > 8.5* 8.7* 8.5*  MG 2.7*  --   --   --   --   GLUCOSE 132*   < > 142* 153* 148*   < > = values in this interval not displayed.     NUTRITION - FOCUSED PHYSICAL EXAM:    Most Recent Value  Orbital Region  No depletion  Upper Arm Region  Moderate depletion  Thoracic and Lumbar Region  Moderate depletion  Buccal Region  Mild depletion  Temple Region  Moderate depletion  Clavicle Bone Region  Moderate depletion  Clavicle and Acromion Bone Region  Moderate depletion  Scapular Bone Region  Moderate depletion  Dorsal Hand  Mild depletion  Patellar Region  Mild depletion  Anterior Thigh Region  Mild depletion  Posterior Calf Region  Mild depletion  Edema (RD Assessment)  None  Hair  Reviewed  Eyes  Reviewed  Mouth  Reviewed  Skin  Reviewed  Nails  Reviewed      Diet Order:  DIET - DYS 1 Room service appropriate? Yes; Fluid consistency: Thin  EDUCATION NEEDS: none identified  No education needs have been identified at this time   Skin:  Skin Assessment: Skin Integrity Issues: Skin Integrity Issues:: Stage II Stage II: she has 5 stage II PI to various toes and stage I to coccyx  Last BM:  2/16   Height:   Ht Readings from Last 1 Encounters:  05/12/2017 5\' 9"  (1.753 m)    Weight:   Wt Readings from Last 1 Encounters:  05/01/17 131 lb 13.4 oz (59.8 kg)    Ideal Body Weight:  65.9 kg  BMI:  Body mass index is 19.47 kg/m.  Estimated Nutritional Needs:   Kcal:  8719-5974  Protein:  65-70 gr  Fluid:  1.5-1.6 liters daily   Colman Cater MS,RD,CSG,LDN Office: [phone]

## 2017-05-05 ENCOUNTER — Encounter (HOSPITAL_COMMUNITY): Payer: Self-pay | Admitting: Gastroenterology

## 2017-05-05 ENCOUNTER — Inpatient Hospital Stay (HOSPITAL_COMMUNITY): Payer: Medicare PPO

## 2017-05-05 DIAGNOSIS — R627 Adult failure to thrive: Principal | ICD-10-CM

## 2017-05-05 LAB — BASIC METABOLIC PANEL
Anion gap: 11 (ref 5–15)
Anion gap: 9 (ref 5–15)
BUN: 46 mg/dL — AB (ref 6–20)
BUN: 41 mg/dL — ABNORMAL HIGH (ref 6–20)
CALCIUM: 8.3 mg/dL — AB (ref 8.9–10.3)
CHLORIDE: 113 mmol/L — AB (ref 101–111)
CO2: 31 mmol/L (ref 22–32)
CO2: 28 mmol/L (ref 22–32)
CREATININE: 0.75 mg/dL (ref 0.44–1.00)
CREATININE: 0.69 mg/dL (ref 0.44–1.00)
Calcium: 7.9 mg/dL — ABNORMAL LOW (ref 8.9–10.3)
Chloride: 114 mmol/L — ABNORMAL HIGH (ref 101–111)
GFR calc Af Amer: >60 mL/min (ref >60)
GFR calc non Af Amer: >60 mL/min (ref >60)
GLUCOSE: 122 mg/dL — AB (ref 65–99)
GLUCOSE: 138 mg/dL — AB (ref 65–99)
Potassium: 3.6 mmol/L (ref 3.5–5.1)
Potassium: 3.9 mmol/L (ref 3.5–5.1)
SODIUM: 156 mmol/L — AB (ref 135–145)
Sodium: 150 mmol/L — ABNORMAL HIGH (ref 135–145)

## 2017-05-05 LAB — CBC
HCT: 36 % (ref 36.0–46.0)
Hemoglobin: 10 g/dL — ABNORMAL LOW (ref 12.0–15.0)
MCH: 24.6 pg — AB (ref 26.0–34.0)
MCHC: 27.8 g/dL — AB (ref 30.0–36.0)
MCV: 88.5 fL (ref 78.0–100.0)
PLATELETS: 968 10*3/uL — AB (ref 150–400)
RBC: 4.07 MIL/uL (ref 3.87–5.11)
RDW: 18 % — AB (ref 11.5–15.5)
WBC: 17.2 10*3/uL — ABNORMAL HIGH (ref 4.0–10.5)

## 2017-05-05 LAB — HEPATIC FUNCTION PANEL
ALT: 70 U/L — ABNORMAL HIGH (ref 14–54)
AST: 53 U/L — ABNORMAL HIGH (ref 15–41)
Albumin: 2.1 g/dL — ABNORMAL LOW (ref 3.5–5.0)
Alkaline Phosphatase: 90 U/L (ref 38–126)
Bilirubin, Direct: 0.3 mg/dL (ref 0.1–0.5)
Indirect Bilirubin: 0.7 mg/dL (ref 0.3–0.9)
Total Bilirubin: 1 mg/dL (ref 0.3–1.2)
Total Protein: 4.8 g/dL — ABNORMAL LOW (ref 6.5–8.1)

## 2017-05-05 MED ORDER — ENOXAPARIN SODIUM 60 MG/0.6ML ~~LOC~~ SOLN
60.0000 mg | Freq: Two times a day (BID) | SUBCUTANEOUS | Status: DC
  Administered 2017-05-05 – 2017-05-07 (×4): 60 mg via SUBCUTANEOUS
  Filled 2017-05-05 (×4): qty 0.6

## 2017-05-05 NOTE — Care Management Note (Signed)
Case Management Note  Patient Details  Name: Tina West MRN: [id number] Date of Birth: Oct 22, 1928  If discussed at Long Length of Stay Meetings, dates discussed:   05/05/2017  Additional Comments:  Sherald Barge, RN 05/05/2017, 12:18 PM

## 2017-05-05 NOTE — Progress Notes (Addendum)
PROGRESS NOTE    Tina West  LOV:[id number] DOB: [birth year] DOA: 04/25/2017 PCP: Claretta Fraise, MD   Brief Narrative:   This is an 84 year old female who was admitted from home on 2/10 following a fall.  She was found to have very poor living conditions at home and an APS report has been opened.  She has been diagnosed with failure to thrive with severe protein calorie malnutrition as well as some dehydration in the setting of Alzheimer's dementia.  She has multiple medical comorbidities including systolic heart failure with an EF of 15%.  She was initially noted to have some hyponatremia that has now switched to hypernatremia.  Additionally, she appears to have poor swallow capacity and speech saw today with recommendations for dysphagia 1 diet. She had NGT placed yesterday, but pulled this out. GI consulted to evaluate patient who does not recommend PEG tube at this time. She is noted to have a UTI for which she has been started on IV Rocephin with urine notable for E. coli and Proteus.  She continues to have trouble with hypernatremia for which D5W rate has been increased with some improvement noted thus far.   Assessment & Plan:   Principal Problem:   FTT (failure to thrive) in adult Active Problems:   Other specified hypothyroidism   Alzheimer's dementia   Hypothyroidism   Hypokalemia   Hypomagnesemia   Acute on chronic systolic CHF (congestive heart failure) (HCC)   Dehydration   Palliative care encounter   Pressure injury of skin   1. Failure to thrive/severe protein calorie malnutrition with concurrent dysphagia. SLP evaluation for dysphagia 1 diet today. No need for PEG per GI. 2. Severe hypernatremia suspicious for DI.  Continue D5 water at 125 cc/h.  Continue to monitor with repeat BMP. 3. AK I-improved.  Continue on fluids as noted above. 4. UTI-E Coli and Proteus. Continue on Rocephin day 6/7.  5. Chronic systolic heart failure with EF 15-20%.  Currently appears  volume depleted-continue IVF. 6. Alzheimer's dementia.  Continue Aricept. 7. Hypothyroidism.  Continue Synthroid at lower dose of 150 mcg. 8. History of atrial fibrillation. Was previously on Eliquis, now changed to full dose Lovenox due to poor swallow with no plans for PEG; and rate controlled with Lopressor. 9. Pressure injuries of skin to bilateral buttocks, lumbar spine, and feet.   DVT prophylaxis:Eliquis to Lovenox full dose today due to poor swallow and no plans for PEG Code Status: Partial; ok with intubation Family Communication: None at bedside Disposition Plan: Patient has guardianship with the department of social services and is eligible to go to [facility] in [place] once she is stable for discharge.   Consultants:   Palliative Care  Procedures:   None  Antimicrobials:   Rocephin 2/14->   Subjective: Patient seen and evaluated today with no new acute complaints or concerns.  She has not been able to swallow and has pulled out her NGT yesterday. GI evaluation reveals poor candidacy for PEG. CT head negative for stroke.  Objective: Vitals:   05/04/17 0600 05/04/17 2116 05/04/17 2300 05/05/17 0427  BP: (!) 139/95  (!) 158/70 (!) 159/87  Pulse:   85 75  Resp:   14 16  Temp:   98.4 F (36.9 C) 98.2 F (36.8 C)  TempSrc:   Axillary Axillary  SpO2:  92% 100% 100%  Weight:      Height:       No intake or output data in the 24 hours ending 05/05/17 1551  Filed Weights   05/07/2017 1432 05/01/17 0649 05/01/17 1350  Weight: 53.5 kg (117 lb 15.1 oz) 55.1 kg (121 lb 7.6 oz) 59.8 kg (131 lb 13.4 oz)    Examination:  General exam: Appears calm and comfortable ; confused Respiratory system: Clear to auscultation. Respiratory effort normal. Cardiovascular system: S1 & S2 heard, RRR. No JVD, murmurs, rubs, gallops or clicks. No pedal edema. Gastrointestinal system: Abdomen is nondistended, soft and nontender. No organomegaly or masses felt. Normal bowel sounds  heard. Central nervous system: Alert and oriented. No focal neurological deficits. Extremities: Symmetric 5 x 5 power; dark yellow UO  Skin: No rashes, lesions or ulcers    Data Reviewed: I have personally reviewed following labs and imaging studies  CBC: Recent Labs  Lab 05/01/17 0533 05/02/17 0716 05/03/17 0336 05/04/17 0720 05/05/17 0537  WBC 24.6* 17.4* 17.7* 18.8* 17.2*  HGB 11.2* 10.3* 10.4* 9.9* 10.0*  HCT 36.5 34.5* 35.2* 34.4* 36.0  MCV 81.5 82.7 84.2 86.4 88.5  PLT 472* 570* 806* 927* 354*   Basic Metabolic Panel: Recent Labs  Lab 05/02/17 0716 05/03/17 0336 05/04/17 0720 05/04/17 1349 05/05/17 0537  NA 146* 156* 162* 158* 156*  K 4.9 4.5 4.5 4.0 3.6  CL 105 113* 119* 118* 114*  CO2 30 31 31 31 31   GLUCOSE 142* 153* 148* 158* 122*  BUN 96* 76* 57* 51* 46*  CREATININE 1.15* 1.05* 0.87 0.89 0.75  CALCIUM 8.5* 8.7* 8.5* 8.2* 8.3*   GFR: Estimated Creatinine Clearance: 47.7 mL/min (by C-G formula based on SCr of 0.75 mg/dL). Liver Function Tests: No results for input(s): AST, ALT, ALKPHOS, BILITOT, PROT, ALBUMIN in the last 168 hours. No results for input(s): LIPASE, AMYLASE in the last 168 hours. No results for input(s): AMMONIA in the last 168 hours. Coagulation Profile: No results for input(s): INR, PROTIME in the last 168 hours. Cardiac Enzymes: No results for input(s): CKTOTAL, CKMB, CKMBINDEX, TROPONINI in the last 168 hours. BNP (last 3 results) No results for input(s): PROBNP in the last 8760 hours. HbA1C: No results for input(s): HGBA1C in the last 72 hours. CBG: No results for input(s): GLUCAP in the last 168 hours. Lipid Profile: No results for input(s): CHOL, HDL, LDLCALC, TRIG, CHOLHDL, LDLDIRECT in the last 72 hours. Thyroid Function Tests: No results for input(s): TSH, T4TOTAL, FREET4, T3FREE, THYROIDAB in the last 72 hours. Anemia Panel: No results for input(s): VITAMINB12, FOLATE, FERRITIN, TIBC, IRON, RETICCTPCT in the last 72  hours. Sepsis Labs: No results for input(s): PROCALCITON, LATICACIDVEN in the last 168 hours.  Recent Results (from the past 240 hour(s))  Culture, Urine     Status: Abnormal   Collection Time: 04/30/17 11:07 AM  Result Value Ref Range Status   Specimen Description   Final    URINE, CLEAN CATCH Performed at [facility], [address] [place], [place] [address]    Special Requests   Final    NONE Performed at [facility], [address] [place], [place] [address]    Culture (A)  Final    >=100,000 COLONIES/mL ESCHERICHIA COLI 50,000 COLONIES/mL PROTEUS MIRABILIS    Report Status 05/04/2017 FINAL  Final   Organism ID, Bacteria ESCHERICHIA COLI (A)  Final   Organism ID, Bacteria PROTEUS MIRABILIS (A)  Final      Susceptibility   Escherichia coli - MIC*    AMPICILLIN 8 SENSITIVE Sensitive     CEFAZOLIN <=4 SENSITIVE Sensitive     CEFTRIAXONE <=1 SENSITIVE Sensitive  CIPROFLOXACIN >=4 RESISTANT Resistant     GENTAMICIN <=1 SENSITIVE Sensitive     IMIPENEM <=0.25 SENSITIVE Sensitive     NITROFURANTOIN <=16 SENSITIVE Sensitive     TRIMETH/SULFA <=20 SENSITIVE Sensitive     AMPICILLIN/SULBACTAM 4 SENSITIVE Sensitive     PIP/TAZO <=4 SENSITIVE Sensitive     Extended ESBL NEGATIVE Sensitive     * >=100,000 COLONIES/mL ESCHERICHIA COLI   Proteus mirabilis - MIC*    AMPICILLIN <=2 SENSITIVE Sensitive     CEFAZOLIN <=4 SENSITIVE Sensitive     CEFTRIAXONE <=1 SENSITIVE Sensitive     CIPROFLOXACIN <=0.25 SENSITIVE Sensitive     GENTAMICIN <=1 SENSITIVE Sensitive     IMIPENEM 2 SENSITIVE Sensitive     NITROFURANTOIN 128 RESISTANT Resistant     TRIMETH/SULFA <=20 SENSITIVE Sensitive     AMPICILLIN/SULBACTAM <=2 SENSITIVE Sensitive     PIP/TAZO <=4 SENSITIVE Sensitive     * 50,000 COLONIES/mL PROTEUS MIRABILIS         Radiology Studies: Ct Head Wo Contrast  Result Date: 05/05/2017 CLINICAL DATA:  Fall 05/02/2017.  Altered mental status. EXAM: CT HEAD WITHOUT CONTRAST  TECHNIQUE: Contiguous axial images were obtained from the base of the skull through the vertex without intravenous contrast. COMPARISON:  04/22/2017 FINDINGS: Brain: There is atrophy and chronic small vessel disease changes. No acute intracranial abnormality. Specifically, no hemorrhage, hydrocephalus, mass lesion, acute infarction, or significant intracranial injury. Vascular: No hyperdense vessel or unexpected calcification. Skull: No acute calvarial abnormality. Sinuses/Orbits: Diffuse sinus disease with mucosal thickening diffusely and air-fluid levels in the left maxillary and right sphenoid sinus. Other: None IMPRESSION: No acute intracranial abnormality. Atrophy, chronic microvascular disease. Acute on chronic sinusitis. Electronically Signed   By: Rolm Baptise M.D.   On: 05/05/2017 08:56   Dg Chest Port 1 View  Result Date: 05/04/2017 CLINICAL DATA:  NG tube placement EXAM: PORTABLE CHEST 1 VIEW COMPARISON:  04/27/2017 FINDINGS: Minimal blunting of the left costophrenic angle, raising the possibility of a trace left pleural effusion. No frank interstitial edema. No pneumothorax. The heart is top-normal in size. Enteric tube terminates in the proximal gastric body. IMPRESSION: Enteric tube terminates in the proximal gastric body. Electronically Signed   By: Julian Hy M.D.   On: 05/04/2017 12:59        Scheduled Meds: . apixaban  2.5 mg Oral BID  . aspirin EC  81 mg Oral Daily  . atorvastatin  20 mg Oral q1800  . digoxin  0.125 mg Oral Daily  . donepezil  5 mg Oral QHS  . feeding supplement (ENSURE ENLIVE)  237 mL Oral BID BM  . free water  400 mL Per Tube Q8H  . levothyroxine  150 mcg Oral QAC breakfast  . magic mouthwash  5 mL Oral QID   And  . lidocaine  5 mL Mouth/Throat QID  . metoprolol tartrate  50 mg Oral BID  . multivitamin with minerals  1 tablet Oral Daily  . sodium chloride flush  3 mL Intravenous Q12H   Continuous Infusions: . sodium chloride    . cefTRIAXone  (ROCEPHIN)  IV Stopped (05/04/17 2137)  . dextrose 125 mL/hr at 05/05/17 0934  . feeding supplement (JEVITY 1.2 CAL) 1,000 mL (05/04/17 1534)     LOS: 9 days    Time spent: 30 minutes    Atom Solivan Darleen Crocker, DO [facility] Pager [phone]  If 7PM-7AM, please contact night-coverage [email or web address] Password [facility] 05/05/2017, 3:51 PM

## 2017-05-05 NOTE — Progress Notes (Signed)
  Speech Language Pathology Treatment: Dysphagia  Patient Details Name: Tina West MRN: [id number] DOB: Mar 22, 1928 Today's Date: 05/05/2017 Time: 0300-9233 SLP Time Calculation (min) (ACUTE ONLY): 24 min  Assessment / Plan / Recommendation Clinical Impression  Pt more alert and appeared much happier today without NG, as she was no longer orally defensive and allowed oral care. Pt requested vanilla ice cream. Pt difficult to position secondary to head turned sharply to the left- repositioned with support of pillows. Pt with cognitive based dysphagia and required moderate multimodality cues (visual, tactile, and verbal) for oral phase of swallow. She responded best to verbal cues and visual model which she could imitate. She was only able to suck from the straw on 2 of 10 trials (SLP supplied careful hand feeding for the rest). Pt consumed 4 oz vanilla ice cream, nectars, ice chips, and occluded straw presentations of thin liquid. Recommend D1/puree with nectar-thick liquids due to poor oral control (Pt can ice chips and ice cream). She would probably benefit from a sippy cup for thin liquids, however SLP does not have on hand today. Recommendations reviewed with RN- po meds crushed as able or whole in puree. Oral intake likely to be limited and she will need 100% feeder assist at this time.    HPI HPI: This is an 84 year old female who was admitted from home on 2/10 following a fall.  She was found to have very poor living conditions at home and an APS report has been opened.  She has been diagnosed with failure to thrive with severe protein calorie malnutrition as well as some dehydration in the setting of Alzheimer's dementia.  She has multiple medical comorbidities including systolic heart failure with an EF of 15%.  She was initially noted to have some hyponatremia that has now switched to hypernatremia.  Additionally, she appears to have poor swallow capacity and will need speech evaluation.  She  is noted to have a UTI for which she has been started on IV Rocephin with urine notable for E. coli and Proteus.  She continues to have worsening hypernatremia with high urine output that is suspicious for diabetes insipidus. BSE requested.      SLP Plan  Continue with current plan of care       Recommendations  Diet recommendations: Dysphagia 1 (puree);Nectar-thick liquid(OK for ice chips and ice cream though) Liquids provided via: Teaspoon;Cup;Straw Medication Administration: Crushed with puree Supervision: Staff to assist with self feeding;Full supervision/cueing for compensatory strategies Compensations: Slow rate;Small sips/bites;Lingual sweep for clearance of pocketing;Monitor for anterior loss Postural Changes and/or Swallow Maneuvers: Seated upright 90 degrees;Upright 30-60 min after meal                Oral Care Recommendations: Oral care BID;Staff/trained caregiver to provide oral care Follow up Recommendations: 24 hour supervision/assistance;Skilled Nursing facility SLP Visit Diagnosis: Dysphagia, oropharyngeal phase (R13.12) Plan: Continue with current plan of care       Thank you,  Genene Churn, [place]                 [place] 05/05/2017, 3:27 PM

## 2017-05-05 NOTE — Progress Notes (Signed)
ANTICOAGULATION CONSULT NOTE - Initial Consult  Pharmacy Consult for lovenox Indication: atrial fibrillation  Allergies  Allergen Reactions  . Codeine Hives  . Contrast Media [Iodinated Diagnostic Agents] Other (See Comments)    unspecified  . Diazepam Hives  . Meperidine Hcl Other (See Comments)    unspecified  . Morphine Other (See Comments)    "caused heart attack"    Patient Measurements: Height: 5\' 9"  (175.3 cm) Weight: 131 lb 13.4 oz (59.8 kg) IBW/kg (Calculated) : 66.2   Vital Signs: Temp: 98.1 F (36.7 C) (02/19 1400) Temp Source: Axillary (02/19 0427) BP: 160/80 (02/19 1400) Pulse Rate: 77 (02/19 1400)  Labs: Recent Labs    05/03/17 0336 05/04/17 0720 05/04/17 1349 05/05/17 0537  HGB 10.4* 9.9*  --  10.0*  HCT 35.2* 34.4*  --  36.0  PLT 806* 927*  --  968*  CREATININE 1.05* 0.87 0.89 0.75    Estimated Creatinine Clearance: 47.7 mL/min (by C-G formula based on SCr of 0.75 mg/dL).   Medical History: Past Medical History:  Diagnosis Date  . A-fib ([place])   . Abdominal cyst    Multiple surgeries in [place]. "Heart Stopped" on the operating table during one of her "cyst" operations, probably 15-20 years ago  . Anxiety   . Dementia   . Diastolic CHF, acute (HCC)    BNP 179 in 1/11. Echo (1/11) with EF 60%, mild focal basal septal hypertrophy, grade I diastolic dysfunction, mild left atrial enlargement.   Marland Kitchen HTN (hypertension)   . Hypothyroidism   . Obesity   . PAD (peripheral artery disease) ([place]) 03/16/2017  . S/P TAH (total abdominal hysterectomy)     Medications:  Medications Prior to Admission  Medication Sig Dispense Refill Last Dose  . acetaminophen (TYLENOL) 325 MG tablet Take 650 mg by mouth every 6 (six) hours as needed for mild pain.   Past Month at Unknown time  . ALPRAZolam (XANAX) 0.25 MG tablet Take 1 tablet (0.25 mg total) by mouth 2 (two) times daily. (Patient taking differently: Take 0.5 mg by mouth daily as needed for anxiety. ) 60  tablet 2 03/11/2017 at Unknown time  . atorvastatin (LIPITOR) 20 MG tablet Take 1 tablet (20 mg total) by mouth daily at 6 PM.     . collagenase (SANTYL) ointment Apply topically daily. 15 g 0   . digoxin (LANOXIN) 0.125 MG tablet Take 1 tablet (0.125 mg total) by mouth daily.     . furosemide (LASIX) 40 MG tablet Take 1 tablet (40 mg total) by mouth daily. 30 tablet    . ibuprofen (ADVIL,MOTRIN) 200 MG tablet Take 200 mg by mouth every 6 (six) hours as needed for moderate pain.   unk at unk  . levothyroxine (SYNTHROID, LEVOTHROID) 150 MCG tablet Take 1 tablet (150 mcg total) by mouth daily before breakfast. (Patient taking differently: Take 175 mcg by mouth daily before breakfast. )     . losartan (COZAAR) 25 MG tablet Take 0.5 tablets (12.5 mg total) by mouth daily.     . metoprolol tartrate (LOPRESSOR) 50 MG tablet Take 1 tablet (50 mg total) by mouth 2 (two) times daily.     Marland Kitchen nystatin (MYCOSTATIN/NYSTOP) powder Apply topically 3 (three) times daily. 15 g 0   . apixaban (ELIQUIS) 5 MG TABS tablet Take 1 tablet (5 mg total) by mouth 2 (two) times daily. (Patient not taking: Reported on 05/11/2017) 60 tablet  Not Taking at Unknown time  . aspirin (ASPIR-LOW) 81 MG EC tablet  Take 81 mg by mouth daily.     Not Taking at Unknown time  . donepezil (ARICEPT) 10 MG tablet Take 0.5 tablets (5 mg total) by mouth at bedtime. (Patient not taking: Reported on 03/12/2017) 30 tablet 5 Not Taking at Unknown time  . Multiple Vitamin (MULTIVITAMIN WITH MINERALS) TABS tablet Take 1 tablet by mouth daily. (Patient not taking: Reported on 04/29/2017)   Not Taking at Unknown time    Assessment: 84 yo lady to start lovenox for afib.  She was on eliquis but needs swallowing evaluation. Goal of Therapy:  Anti-Xa level 0.6-1 units/ml 4hrs after LMWH dose given Monitor platelets by anticoagulation protocol: Yes   Plan:  Lovenox 60 mg sq q12 hours CBC q MWF  Tina West 05/05/2017,4:10 PM

## 2017-05-05 NOTE — Consult Note (Signed)
Referring Provider: Dr. Manuella Ghazi  Primary Care Physician:  Claretta Fraise, MD Primary Gastroenterologist:  Dr. Oneida Alar   Date of Admission: 05/10/2017 Date of Consultation: 05/05/17  Reason for Consultation:  PEG tube placement   HPI:  Tina West is an 84 y.o. year old female who was admitted from home on 2/10 after a fall. She was found to have poor living conditions at home, and APS was contacted. She now has an interim guardian with DSS, Glennie Hawk [phone], ext [phone]). Admitted with failure to thrive, severe malnutrition, dehydration in setting of Alzheimer's dementia. Also found to have UTI, started on IV antibiotics. Worsening hypernatremia and high urine output concerning for diabetes insipdus. Multiple medical issues, with mild to moderate CAD, severe systolic heart failure with EF of 15-20%. Chronic anticoagulation with history of afib, on Eliquis. Prior diverticular bleed in Jan 2019 at outside hospital [facility]), s/p colonoscopy with clip placed at hypertrophied tissue at mouth of a diverticulum in sigmoid colon.   Clinical bedside swallow evaluation on 2/18 with inability to sustain appropriate level of alertness for safe oral trials, poor labial closure and poor oral manipulation of ice chip. Patient defensive during speech evaluation. NG tube placement difficult yesterday due to resistance and was placed at bedside by Dr. Arnoldo Morale. Patient pulled this out early this morning and was unable to be replaced. NG tube was needed for nutrition and free water flushes. It has been felt that a PEG tube may be better than a temporary NG tube due to patient's agitation and annoyance, pulling, etc.   Patient sleeping at time of consultation. No family present. Nurse Tech in room stating she was restless this morning and just got to sleep. I spoke with nursing staff who states she was eating last week when the tray was near her; however, she is now just pocketing food and unable to tolerate a  diet.   I contacted DSS, Glennie Hawk [phone] ext [phone]) and verified that she would be the point of contact for any medical decision-making and consent. She is aware of risks and benefits of PEG placement and that this would be discussed with attending GI physician as well regarding candidacy. Morey Hummingbird will be discussing with her supervisor as well.   Last dose of Eliquis received 2/18 in evening.   Past Medical History:  Diagnosis Date  . A-fib ([place])   . Abdominal cyst    Multiple surgeries in [place]. "Heart Stopped" on the operating table during one of her "cyst" operations, probably 15-20 years ago  . Anxiety   . Dementia   . Diastolic CHF, acute (HCC)    BNP 179 in 1/11. Echo (1/11) with EF 60%, mild focal basal septal hypertrophy, grade I diastolic dysfunction, mild left atrial enlargement.   Marland Kitchen HTN (hypertension)   . Hypothyroidism   . Obesity   . PAD (peripheral artery disease) ([place]) 03/16/2017  . S/P TAH (total abdominal hysterectomy)     Past Surgical History:  Procedure Laterality Date  . ABDOMINAL HYSTERECTOMY    . CHOLECYSTECTOMY    . COLONOSCOPY  03/2017   [facility]: clip placed at hypertrophied tissue at mouth of a diverticulum in sigmoid colon  . HERNIA REPAIR    . RIGHT/LEFT HEART CATH AND CORONARY ANGIOGRAPHY N/A 03/18/2017   Procedure: RIGHT/LEFT HEART CATH AND CORONARY ANGIOGRAPHY;  Surgeon: Jettie Booze, MD;  Location: [facility];  Service: Cardiovascular;  Laterality: N/A;    Prior to Admission medications   Medication Sig Start Date End  Date Taking? Authorizing Provider  acetaminophen (TYLENOL) 325 MG tablet Take 650 mg by mouth every 6 (six) hours as needed for mild pain.   Yes [provider]  ALPRAZolam (XANAX) 0.25 MG tablet Take 1 tablet (0.25 mg total) by mouth 2 (two) times daily. Patient taking differently: Take 0.5 mg by mouth daily as needed for anxiety.  07/14/14  Yes Claretta Fraise, MD  atorvastatin (LIPITOR) 20 MG  tablet Take 1 tablet (20 mg total) by mouth daily at 6 PM. 03/27/17  Yes Horris Latino, Adline Peals, MD  collagenase (SANTYL) ointment Apply topically daily. 03/28/17  Yes Alma Friendly, MD  digoxin (LANOXIN) 0.125 MG tablet Take 1 tablet (0.125 mg total) by mouth daily. 03/28/17  Yes Alma Friendly, MD  furosemide (LASIX) 40 MG tablet Take 1 tablet (40 mg total) by mouth daily. 03/27/17  Yes Alma Friendly, MD  ibuprofen (ADVIL,MOTRIN) 200 MG tablet Take 200 mg by mouth every 6 (six) hours as needed for moderate pain.   Yes [provider]  levothyroxine (SYNTHROID, LEVOTHROID) 150 MCG tablet Take 1 tablet (150 mcg total) by mouth daily before breakfast. Patient taking differently: Take 175 mcg by mouth daily before breakfast.  03/28/17  Yes Alma Friendly, MD  losartan (COZAAR) 25 MG tablet Take 0.5 tablets (12.5 mg total) by mouth daily. 03/28/17  Yes Alma Friendly, MD  metoprolol tartrate (LOPRESSOR) 50 MG tablet Take 1 tablet (50 mg total) by mouth 2 (two) times daily. 03/27/17  Yes Alma Friendly, MD  nystatin (MYCOSTATIN/NYSTOP) powder Apply topically 3 (three) times daily. 03/27/17  Yes Alma Friendly, MD  apixaban (ELIQUIS) 5 MG TABS tablet Take 1 tablet (5 mg total) by mouth 2 (two) times daily. Patient not taking: Reported on 04/29/2017 03/27/17   Alma Friendly, MD  aspirin (ASPIR-LOW) 81 MG EC tablet Take 81 mg by mouth daily.      [provider]  donepezil (ARICEPT) 10 MG tablet Take 0.5 tablets (5 mg total) by mouth at bedtime. Patient not taking: Reported on 03/12/2017 11/10/14   Claretta Fraise, MD  Multiple Vitamin (MULTIVITAMIN WITH MINERALS) TABS tablet Take 1 tablet by mouth daily. Patient not taking: Reported on 04/21/2017 03/28/17   Alma Friendly, MD    Current Facility-Administered Medications  Medication Dose Route Frequency Provider Last Rate Last Dose  . 0.9 %  sodium chloride infusion  250 mL Intravenous PRN  Isaac Bliss, Rayford Halsted, MD      . acetaminophen (TYLENOL) tablet 650 mg  650 mg Oral Q6H PRN Isaac Bliss, Rayford Halsted, MD   650 mg at 05/04/17 2052   Or  . acetaminophen (TYLENOL) suppository 650 mg  650 mg Rectal Q6H PRN Isaac Bliss, Rayford Halsted, MD      . apixaban Arne Cleveland) tablet 2.5 mg  2.5 mg Oral BID Isaac Bliss, Rayford Halsted, MD   2.5 mg at 05/04/17 2052  . aspirin EC tablet 81 mg  81 mg Oral Daily Isaac Bliss, Rayford Halsted, MD   Stopped at 05/04/17 [phone]  . atorvastatin (LIPITOR) tablet 20 mg  20 mg Oral q1800 Isaac Bliss, Rayford Halsted, MD   20 mg at 05/03/17 1806  . cefTRIAXone (ROCEPHIN) 1 g in sodium chloride 0.9 % 100 mL IVPB  1 g Intravenous Q24H Manuella Ghazi, Pratik D, DO   Stopped at 05/04/17 2137  . dextrose 5 % solution   Intravenous Continuous Heath Lark D, DO 125 mL/hr at 05/05/17 0934    .  digoxin (LANOXIN) tablet 0.125 mg  0.125 mg Oral Daily Isaac Bliss, Rayford Halsted, MD   Stopped at 05/04/17 [phone]  . donepezil (ARICEPT) tablet 5 mg  5 mg Oral QHS Isaac Bliss, Rayford Halsted, MD   5 mg at 05/04/17 2052  . feeding supplement (ENSURE ENLIVE) (ENSURE ENLIVE) liquid 237 mL  237 mL Oral BID BM Isaac Bliss, Rayford Halsted, MD   237 mL at 05/02/17 1010  . feeding supplement (JEVITY 1.2 CAL) liquid 1,000 mL  1,000 mL Per Tube Continuous Shah, Pratik D, DO 55 mL/hr at 05/04/17 1534 1,000 mL at 05/04/17 1534  . free water 400 mL  400 mL Per Tube Q8H Shah, Pratik D, DO   Stopped at 05/05/17 [phone]  . hydrALAZINE (APRESOLINE) injection 5 mg  5 mg Intravenous Q4H PRN Reubin Milan, MD   5 mg at 05/04/17 0532  . levothyroxine (SYNTHROID, LEVOTHROID) tablet 150 mcg  150 mcg Oral QAC breakfast Isaac Bliss, Rayford Halsted, MD   150 mcg at 05/03/17 1230  . magic mouthwash  5 mL Oral QID Manuella Ghazi, Pratik D, DO       And  . lidocaine (XYLOCAINE) 2 % viscous mouth solution 5 mL  5 mL Mouth/Throat QID Manuella Ghazi, Pratik D, DO   5 mL at 05/05/17 0048  . LORazepam (ATIVAN) injection 1 mg  1 mg  Intravenous Q6H PRN Manuella Ghazi, Pratik D, DO   1 mg at 05/04/17 2051  . metoprolol tartrate (LOPRESSOR) tablet 50 mg  50 mg Oral BID Isaac Bliss, Rayford Halsted, MD   50 mg at 05/04/17 2052  . multivitamin with minerals tablet 1 tablet  1 tablet Oral Daily Isaac Bliss, Rayford Halsted, MD   Stopped at 05/04/17 [phone]  . ondansetron (ZOFRAN) tablet 4 mg  4 mg Oral Q6H PRN Isaac Bliss, Rayford Halsted, MD   4 mg at 04/27/17 1657   Or  . ondansetron (ZOFRAN) injection 4 mg  4 mg Intravenous Q6H PRN Isaac Bliss, Rayford Halsted, MD      . senna-docusate (Senokot-S) tablet 1 tablet  1 tablet Oral QHS PRN Isaac Bliss, Rayford Halsted, MD      . sodium chloride flush (NS) 0.9 % injection 3 mL  3 mL Intravenous Q12H Isaac Bliss, Rayford Halsted, MD   3 mL at 05/04/17 2219  . sodium chloride flush (NS) 0.9 % injection 3 mL  3 mL Intravenous PRN Isaac Bliss, Rayford Halsted, MD        Allergies as of 04/24/2017 - Review Complete 04/19/2017  Allergen Reaction Noted  . Codeine Hives   . Contrast media [iodinated diagnostic agents] Other (See Comments) 03/12/2017  . Diazepam Hives   . Meperidine hcl Other (See Comments)   . Morphine Other (See Comments)     Family History  Problem Relation Age of Onset  . Heart disease Father        Died of "heart attack" at age 62  . CAD Son        Stents.  Currently age 68  . Brain cancer Mother   . Hypertension Brother   . Brain cancer Brother   . Ulcers Son        bleeding ulcers  . Alzheimer's disease Brother        presumed to have died of heart attack  . Stomach cancer Daughter        died in her 93's    Social History   Socioeconomic History  . Marital status: Widowed  Spouse name: Not on file  . Number of children: 1  . Years of education: Not on file  . Highest education level: Not on file  Social Needs  . Financial resource strain: Not on file  . Food insecurity - worry: Not on file  . Food insecurity - inability: Not on file  . Transportation needs -  medical: Not on file  . Transportation needs - non-medical: Not on file  Occupational History  . Not on file  Tobacco Use  . Smoking status: Former Research scientist (life sciences)  . Smokeless tobacco: Never Used  . Tobacco comment: quit smoking at age 64  Substance and Sexual Activity  . Alcohol use: No    Frequency: Never  . Drug use: No  . Sexual activity: Not on file  Other Topics Concern  . Not on file  Social History Narrative   Originally from [place], now lives in [place]. She lives with her son. Her grandson and his wife live nearby.    Review of Systems: Unable to obtain due to dementia.   Physical Exam: Vital signs in last 24 hours: Temp:  [98.2 F (36.8 C)-98.4 F (36.9 C)] 98.2 F (36.8 C) (02/19 0427) Pulse Rate:  [75-85] 75 (02/19 0427) Resp:  [14-16] 16 (02/19 0427) BP: (158-159)/(70-87) 159/87 (02/19 0427) SpO2:  [92 %-100 %] 100 % (02/19 0427) Last BM Date: 05/04/17 General:  Resting comfortable with eyes closed, no distress noted Lungs:  Clear throughout to auscultation.   Heart:  Irregularly irregular  Abdomen:  Soft, nontender and nondistended. No masses, hepatosplenomegaly or hernias noted. Normal bowel sounds, without guarding, and without rebound.   Rectal:  Deferred  Extremities:  Without edema. Neurologic:  Unable to assess orientation due to dementia   Intake/Output from previous day: 02/18 0701 - 02/19 0700 In: 400 [NG/GT:400] Out: -  Intake/Output this shift: No intake/output data recorded.  Lab Results: Recent Labs    05/03/17 0336 05/04/17 0720 05/05/17 0537  WBC 17.7* 18.8* 17.2*  HGB 10.4* 9.9* 10.0*  HCT 35.2* 34.4* 36.0  PLT 806* 927* 968*   BMET Recent Labs    05/04/17 0720 05/04/17 1349 05/05/17 0537  NA 162* 158* 156*  K 4.5 4.0 3.6  CL 119* 118* 114*  CO2 31 31 31   GLUCOSE 148* 158* 122*  BUN 57* 51* 46*  CREATININE 0.87 0.89 0.75  CALCIUM 8.5* 8.2* 8.3*   Lab Results  Component Value Date   ALT 32 04/21/2017   AST 32  04/25/2017   ALKPHOS 87 05/06/2017   BILITOT 4.2 (H) 04/20/2017    Studies/Results: Ct Head Wo Contrast  Result Date: 05/05/2017 CLINICAL DATA:  Fall 05/06/2017.  Altered mental status. EXAM: CT HEAD WITHOUT CONTRAST TECHNIQUE: Contiguous axial images were obtained from the base of the skull through the vertex without intravenous contrast. COMPARISON:  04/23/2017 FINDINGS: Brain: There is atrophy and chronic small vessel disease changes. No acute intracranial abnormality. Specifically, no hemorrhage, hydrocephalus, mass lesion, acute infarction, or significant intracranial injury. Vascular: No hyperdense vessel or unexpected calcification. Skull: No acute calvarial abnormality. Sinuses/Orbits: Diffuse sinus disease with mucosal thickening diffusely and air-fluid levels in the left maxillary and right sphenoid sinus. Other: None IMPRESSION: No acute intracranial abnormality. Atrophy, chronic microvascular disease. Acute on chronic sinusitis. Electronically Signed   By: Rolm Baptise M.D.   On: 05/05/2017 08:56   Dg Chest Port 1 View  Result Date: 05/04/2017 CLINICAL DATA:  NG tube placement EXAM: PORTABLE CHEST 1 VIEW COMPARISON:  04/22/2017 FINDINGS:  Minimal blunting of the left costophrenic angle, raising the possibility of a trace left pleural effusion. No frank interstitial edema. No pneumothorax. The heart is top-normal in size. Enteric tube terminates in the proximal gastric body. IMPRESSION: Enteric tube terminates in the proximal gastric body. Electronically Signed   By: Julian Hy M.D.   On: 05/04/2017 12:59    Impression: 84 year old female with multiple co-morbidities, admitted with failure to thrive, malnutrition, dehydration, UTI, worsening hypernatremia. GI consulted for PEG tube placement consideration as she was unable to tolerate NG tube for enteral feeds, free water flushes, and medications. Unfortunately, she was found to be in poor living conditions at home and now interim  guardianship has been provided by DSS, contact person Glennie Hawk [phone], ext [phone]).   I reviewed chart from prior hospitalizations, and she has had considerable decline in health. EF 15-20%, on chronic anticoagulation secondary to afib with last dose 2/18 in the evening. There was also some question of duodenitis or PUD on CT imaging in Dec 2018, and elevated LFTs at that time were felt to be secondary to heart failure and were trending down. Last LFTs 2/10 on admission with isolated elevated bilirubin at 4.2.   Discussed with DSS the risks and benefits of PEG tube placement. Patient may not be the best candidate for this endoscopic procedure due to multiple comorbidities; will discuss further with Dr. Oneida Alar. Regardless, Eliquis would need to be on hold at least 48 hours prior to procedure. In this scenario, I query if palliative care should assess patient again in light of multiple health issues.   Plan: Oral meds on hold as of now due to inability for oral intake Would recommend palliative see patient again (signed off on 04/30/17) To discuss further with Dr. Oneida Alar Eliquis remains on hold for now with last dose 2/18 Recheck HFP  Any consent would need to be obtained from Glennie Hawk with DSS, interim guardian for patient (recently named on 2/18)  Annitta Needs, PhD, ANP-BC [facility]     LOS: 9 days    05/05/2017, 1:24 PM

## 2017-05-05 NOTE — Progress Notes (Signed)
PATIENT PULLED OUT NG TUBE. I ALONG WITH ANOTHER RN TRIED TO PLACED ONE IN BUT COULD NOT.  BOTH NARES ARE RAW RED AND TRAUMATIZED..  I CALLED DR. ORTIZ AND HE SAID TO LEAVE NG TUBE OUT FOR NOW AND LET AM DOCTOR FOLLOW-UP.

## 2017-05-06 ENCOUNTER — Other Ambulatory Visit: Payer: Self-pay | Admitting: Pharmacist

## 2017-05-06 DIAGNOSIS — N390 Urinary tract infection, site not specified: Secondary | ICD-10-CM

## 2017-05-06 DIAGNOSIS — E039 Hypothyroidism, unspecified: Secondary | ICD-10-CM

## 2017-05-06 DIAGNOSIS — I5022 Chronic systolic (congestive) heart failure: Secondary | ICD-10-CM

## 2017-05-06 DIAGNOSIS — L89151 Pressure ulcer of sacral region, stage 1: Secondary | ICD-10-CM

## 2017-05-06 LAB — CBC
HEMATOCRIT: 32.7 % — AB (ref 36.0–46.0)
HEMATOCRIT: 32.6 % — AB (ref 36.0–46.0)
Hemoglobin: 9.2 g/dL — ABNORMAL LOW (ref 12.0–15.0)
Hemoglobin: 9.4 g/dL — ABNORMAL LOW (ref 12.0–15.0)
MCH: 24.8 pg — AB (ref 26.0–34.0)
MCH: 25.3 pg — ABNORMAL LOW (ref 26.0–34.0)
MCHC: 28.1 g/dL — ABNORMAL LOW (ref 30.0–36.0)
MCHC: 28.8 g/dL — AB (ref 30.0–36.0)
MCV: 88.1 fL (ref 78.0–100.0)
MCV: 87.6 fL (ref 78.0–100.0)
PLATELETS: 972 10*3/uL — AB (ref 150–400)
Platelets: 927 10*3/uL (ref 150–400)
RBC: 3.71 MIL/uL — AB (ref 3.87–5.11)
RBC: 3.72 MIL/uL — ABNORMAL LOW (ref 3.87–5.11)
RDW: 17.9 % — ABNORMAL HIGH (ref 11.5–15.5)
RDW: 17.9 % — AB (ref 11.5–15.5)
WBC: 16.1 10*3/uL — ABNORMAL HIGH (ref 4.0–10.5)
WBC: 13.6 10*3/uL — ABNORMAL HIGH (ref 4.0–10.5)

## 2017-05-06 LAB — BASIC METABOLIC PANEL
Anion gap: 10 (ref 5–15)
BUN: 38 mg/dL — AB (ref 6–20)
CHLORIDE: 116 mmol/L — AB (ref 101–111)
CO2: 29 mmol/L (ref 22–32)
Calcium: 8.1 mg/dL — ABNORMAL LOW (ref 8.9–10.3)
Creatinine, Ser: 0.74 mg/dL (ref 0.44–1.00)
GFR calc Af Amer: >60 mL/min (ref >60)
GFR calc non Af Amer: >60 mL/min (ref >60)
GLUCOSE: 110 mg/dL — AB (ref 65–99)
POTASSIUM: 3.8 mmol/L (ref 3.5–5.1)
Sodium: 155 mmol/L — ABNORMAL HIGH (ref 135–145)

## 2017-05-06 MED ORDER — FENTANYL CITRATE (PF) 100 MCG/2ML IJ SOLN
12.5000 ug | INTRAMUSCULAR | Status: AC | PRN
  Administered 2017-05-06 – 2017-05-12 (×3): 12.5 ug via INTRAVENOUS
  Filled 2017-05-06 (×3): qty 2

## 2017-05-06 MED ORDER — METOPROLOL TARTRATE 5 MG/5ML IV SOLN
2.5000 mg | Freq: Three times a day (TID) | INTRAVENOUS | Status: DC
  Administered 2017-05-06 – 2017-05-08 (×4): 2.5 mg via INTRAVENOUS
  Filled 2017-05-06 (×5): qty 5

## 2017-05-06 MED ORDER — LORAZEPAM 2 MG/ML IJ SOLN
1.0000 mg | Freq: Once | INTRAMUSCULAR | Status: AC
  Administered 2017-05-06: 1 mg via INTRAMUSCULAR
  Filled 2017-05-06: qty 1

## 2017-05-06 MED ORDER — LEVOTHYROXINE SODIUM 100 MCG IV SOLR
50.0000 ug | Freq: Every day | INTRAVENOUS | Status: DC
  Administered 2017-05-06 – 2017-05-08 (×3): 50 ug via INTRAVENOUS
  Filled 2017-05-06 (×8): qty 5

## 2017-05-06 NOTE — Plan of Care (Signed)
Page to call grand daughter-in-law, Tina West. Call to Tina West, we talk about Tina West's health.  I share that at this point, I believe she would qualify for residential hospice.  Tina West states that she was told previously that Tina West would not qualify for residential hospice.  We talked about her labs including low albumin at 2.1. Tina West agrees to meet face-to-face tomorrow 2/21 at 1330.  She states she is considering residential hospice, and asks about DSS guardianship.  I share the temporary guardianship does not allow to make end-of-life decisions.  I share that if Tina West elects residential hospice, I do not believe DSS would disagree.  I share that we will assist her with DSS communication. No charge.

## 2017-05-06 NOTE — Progress Notes (Signed)
Pulled out NGT

## 2017-05-06 NOTE — Progress Notes (Signed)
Upon initial assessment pt IV infiltrated. 3 RN's attempted to establish new access with no success. 6 attempts total. Unable to give night dose of rocephin or restart IV fluids. This RN did not give PO meds due to pt holding applesauce in her mouth. Took pt multiple verbal commands to swallow applesauce. This RN afraid of possible aspiration.   Will continue to monitor  Margaret Pyle, RN

## 2017-05-06 NOTE — Progress Notes (Signed)
CRITICAL VALUE ALERT  Critical Value:  Platelets 927  Date & Time Notied:  05-06-17  Provider Notified: Dyann Kief, MD  Orders Received/Actions taken: continue to monitor.

## 2017-05-06 NOTE — Progress Notes (Signed)
PROGRESS NOTE    Tina West  GYK:[id number] DOB: 1928/10/25 DOA: 05/12/2017 PCP: Claretta Fraise, MD   Brief Narrative:   This is an 84 year old female who was admitted from home on 2/10 following a fall.  She was found to have very poor living conditions at home and an APS report has been opened.  She has been diagnosed with failure to thrive with severe protein calorie malnutrition as well as some dehydration in the setting of Alzheimer's dementia.  She has multiple medical comorbidities including systolic heart failure with an EF of 15%.  She was initially noted to have some hyponatremia that has now switched to hypernatremia.  Additionally, she appears to have poor swallow capacity and speech saw today with recommendations for dysphagia 1 diet. She had NGT placed yesterday, but pulled this out. GI consulted to evaluate patient who does not recommend PEG tube at this time. She is noted to have a UTI for which she has been started on IV Rocephin with urine notable for E. coli and Proteus.  She continues to have trouble with hypernatremia for which D5W rate has been increased.  Assessment & Plan:   Principal Problem:   FTT (failure to thrive) in adult Active Problems:   Other specified hypothyroidism   Alzheimer's dementia   Hypothyroidism   Hypokalemia   Hypomagnesemia   Acute on chronic systolic CHF (congestive heart failure) (HCC)   Dehydration   Palliative care encounter   Pressure injury of skin   1. Failure to thrive/severe protein calorie malnutrition with concurrent dysphagia. Speech therapy advise for high risk of aspiration, but will allow for dysphagia 1 and nectar thick liquids when fully awake/alert. Not a candidate for PEG. Has continue to actively decline. Palliative would have meeting in am; patient prognosis is poor and will benefit of full comfort care management. 2. Severe hypernatremia suspicious for DI.  Continue D5 water at 125 cc/h.  Continue to monitor with  repeat BMP. Still with elevated sodium level. 3. AK I- overall improved.  Continue IVF's 4. UTI- due to E Coli (resistant to ciprofloxacin) and Proteus (resistant to Nitrofurantoin); no fever. Will complete 7 days of rocephin today. No planning further antibiotics currently.  5. Chronic systolic heart failure with EF 15-20%. Without alarming signs for hypervolemia. Continue D5W. Follow daily weights.  6. Acute on chronic AMS in the setting of Alzheimer's dementia. In a patient with hypernatremia and UTI. Stop Aricept, patient unable to safely swallow meds. 7. Hypothyroidism. Will continue IV synthroid for now  8. History of atrial fibrillation. Was previously on Eliquis, now changed to full dose Lovenox due to poor swallowing; not a candidate for PEG. Continue rate control with lopressor (now IV) 9. Pressure injuries of skin to bilateral buttocks, lumbar spine, and feet: in the setting of poor nutrition and lack of adequate mobility prior to admission. Will continue preventive measures and constant repositioning.   DVT prophylaxis: Lovenox  Code Status: Partial; ok with intubation Family Communication: None at bedside Disposition Plan: Patient has guardianship with the department of social services and is eligible to go to [facility] in [place] once she is stable for discharge. Due to further active decline in her condition palliative care has reach to her POA and planning meeting tomorrow to determine best [place] and future interventions.   Consultants:   Palliative Care  Procedures:   See below for x-ray reports   Antimicrobials:   Rocephin 2/14->2/20 (completed 7 days of antibiotics)   Subjective: Patient continued to  be confused and having difficulty taking medications by mouth or any significant intake for nutrition purposes.  Speech therapy has found her at high risk for aspiration. Continue to have hypernatremia.  Objective: Vitals:   05/05/17 1400 05/05/17 2300 05/06/17 0645  05/06/17 1508  BP: (!) 160/80 (!) 160/92 (!) 162/74 136/82  Pulse: 77 73 (!) 129 (!) 129  Resp: 18 (!) 21 17 (!) 24  Temp: 98.1 F (36.7 C) 97.8 F (36.6 C) 99.1 F (37.3 C) 98.9 F (37.2 C)  TempSrc:  Axillary  Axillary  SpO2: 100% 95% 93% 100%  Weight:      Height:        Intake/Output Summary (Last 24 hours) at 05/06/2017 1840 Last data filed at 05/06/2017 0500 Gross per 24 hour  Intake 10 ml  Output 300 ml  Net -290 ml   Filed Weights   05/11/2017 1432 05/01/17 0649 05/01/17 1350  Weight: 53.5 kg (117 lb 15.1 oz) 55.1 kg (121 lb 7.6 oz) 59.8 kg (131 lb 13.4 oz)    Examination: General exam: Patient continued to be confused, oriented only to person and at this moment having difficulty taking any of her medications by mouth.  Continued to have any minimal p.o. intake as part of nutrition. Respiratory system: Good air movement bilaterally, no using accessory muscles, normal respiratory effort, good O2 sat on room air. Cardiovascular system: S1 and S2, positive systolic ejection murmur, no rubs, no gallops, no JVD. Gastrointestinal system: Soft, nontender, nondistended, positive bowel sounds. Central nervous system: No focal neurological deficits appreciated, cranial nerves grossly intact. Patient oriented only to person and with difficulties following simple commands. Extremities: Trace edema bilaterally, no cyanosis, no clubbing.  Patient with generalized weakness and a muscle strength of 3 out of 5 due to poor effort and deconditioning. Skin: Stage I decubitus ulcers appreciated, no signs of super imposed infection.  Data Reviewed: I have personally reviewed following labs and imaging studies  CBC: Recent Labs  Lab 05/03/17 0336 05/04/17 0720 05/05/17 0537 05/06/17 0621 05/06/17 1604  WBC 17.7* 18.8* 17.2* 16.1* 13.6*  HGB 10.4* 9.9* 10.0* 9.2* 9.4*  HCT 35.2* 34.4* 36.0 32.7* 32.6*  MCV 84.2 86.4 88.5 88.1 87.6  PLT 806* 927* 968* 972* 416*   Basic Metabolic  Panel: Recent Labs  Lab 05/04/17 0720 05/04/17 1349 05/05/17 0537 05/05/17 1641 05/06/17 0621  NA 162* 158* 156* 150* 155*  K 4.5 4.0 3.6 3.9 3.8  CL 119* 118* 114* 113* 116*  CO2 31 31 31 28 29   GLUCOSE 148* 158* 122* 138* 110*  BUN 57* 51* 46* 41* 38*  CREATININE 0.87 0.89 0.75 0.69 0.74  CALCIUM 8.5* 8.2* 8.3* 7.9* 8.1*   GFR: Estimated Creatinine Clearance: 47.7 mL/min (by C-G formula based on SCr of 0.74 mg/dL).   Liver Function Tests: Recent Labs  Lab 05/05/17 1641  AST 53*  ALT 70*  ALKPHOS 90  BILITOT 1.0  PROT 4.8*  ALBUMIN 2.1*     Recent Results (from the past 240 hour(s))  Culture, Urine     Status: Abnormal   Collection Time: 04/30/17 11:07 AM  Result Value Ref Range Status   Specimen Description   Final    URINE, CLEAN CATCH Performed at [facility], [address] [place], [place] [address]    Special Requests   Final    NONE Performed at [facility], [address], [place], [place] [address]    Culture (A)  Final    >=100,000 COLONIES/mL ESCHERICHIA  COLI 50,000 COLONIES/mL PROTEUS MIRABILIS    Report Status 05/04/2017 FINAL  Final   Organism ID, Bacteria ESCHERICHIA COLI (A)  Final   Organism ID, Bacteria PROTEUS MIRABILIS (A)  Final      Susceptibility   Escherichia coli - MIC*    AMPICILLIN 8 SENSITIVE Sensitive     CEFAZOLIN <=4 SENSITIVE Sensitive     CEFTRIAXONE <=1 SENSITIVE Sensitive     CIPROFLOXACIN >=4 RESISTANT Resistant     GENTAMICIN <=1 SENSITIVE Sensitive     IMIPENEM <=0.25 SENSITIVE Sensitive     NITROFURANTOIN <=16 SENSITIVE Sensitive     TRIMETH/SULFA <=20 SENSITIVE Sensitive     AMPICILLIN/SULBACTAM 4 SENSITIVE Sensitive     PIP/TAZO <=4 SENSITIVE Sensitive     Extended ESBL NEGATIVE Sensitive     * >=100,000 COLONIES/mL ESCHERICHIA COLI   Proteus mirabilis - MIC*    AMPICILLIN <=2 SENSITIVE Sensitive     CEFAZOLIN <=4 SENSITIVE Sensitive     CEFTRIAXONE <=1 SENSITIVE Sensitive     CIPROFLOXACIN <=0.25  SENSITIVE Sensitive     GENTAMICIN <=1 SENSITIVE Sensitive     IMIPENEM 2 SENSITIVE Sensitive     NITROFURANTOIN 128 RESISTANT Resistant     TRIMETH/SULFA <=20 SENSITIVE Sensitive     AMPICILLIN/SULBACTAM <=2 SENSITIVE Sensitive     PIP/TAZO <=4 SENSITIVE Sensitive     * 50,000 COLONIES/mL PROTEUS MIRABILIS     Radiology Studies: Ct Head Wo Contrast  Result Date: 05/05/2017 CLINICAL DATA:  Fall 04/23/2017.  Altered mental status. EXAM: CT HEAD WITHOUT CONTRAST TECHNIQUE: Contiguous axial images were obtained from the base of the skull through the vertex without intravenous contrast. COMPARISON:  04/17/2017 FINDINGS: Brain: There is atrophy and chronic small vessel disease changes. No acute intracranial abnormality. Specifically, no hemorrhage, hydrocephalus, mass lesion, acute infarction, or significant intracranial injury. Vascular: No hyperdense vessel or unexpected calcification. Skull: No acute calvarial abnormality. Sinuses/Orbits: Diffuse sinus disease with mucosal thickening diffusely and air-fluid levels in the left maxillary and right sphenoid sinus. Other: None IMPRESSION: No acute intracranial abnormality. Atrophy, chronic microvascular disease. Acute on chronic sinusitis. Electronically Signed   By: Rolm Baptise M.D.   On: 05/05/2017 08:56    Scheduled Meds: . enoxaparin (LOVENOX) injection  60 mg Subcutaneous Q12H  . feeding supplement (ENSURE ENLIVE)  237 mL Oral BID BM  . free water  400 mL Per Tube Q8H  . levothyroxine  50 mcg Intravenous Daily  . magic mouthwash  5 mL Oral QID   And  . lidocaine  5 mL Mouth/Throat QID  . metoprolol tartrate  2.5 mg Intravenous Q8H  . multivitamin with minerals  1 tablet Oral Daily   Continuous Infusions: . cefTRIAXone (ROCEPHIN)  IV Stopped (05/06/17 1628)  . dextrose 125 mL/hr at 05/06/17 1140  . feeding supplement (JEVITY 1.2 CAL) 1,000 mL (05/04/17 1534)     LOS: 10 days    Time spent: 25 minutes    Barton Dubois, MD [facility] Pager [phone]  If 7PM-7AM, please contact night-coverage [email or web address] Password [email or web address] 05/06/2017, 6:40 PM

## 2017-05-06 NOTE — Progress Notes (Signed)
MD paged about need for midline insertion due to multiple failed attempts of peripheral IV insertions. Also received order for IM injection of ativan. Pt moaning and very restless.  Margaret Pyle, RN

## 2017-05-06 NOTE — Care Management Important Message (Signed)
Important Message  Patient Details  Name: Tina West MRN: [id number] Date of Birth: [birth year]   Medicare Important Message Given:  Yes    Sherald Barge, RN 05/06/2017, 11:26 AM

## 2017-05-06 NOTE — Progress Notes (Signed)
Pt not taking Po's. Pocketing food,meds. MD aware. Consulting back with Pallative Care.

## 2017-05-06 NOTE — Patient Outreach (Signed)
[facility]) Care Management  05/06/2017  Tina West February 08, 1929 [id number]  84 year old female referred to [facility] for transportation and medication related issues. [facility] services requested for medication management.  PMHx includes, but not limited to, recent diagnosis of HFrEF (EF 67-12% 4/'58), alcoholic cardiomyopathy, CAD, atrial fibrillation on chronic anticoagulation with CHADSVASC score 5, falls, HTN, hypothyroidism, PAD, dementia, and anxiety.  Patient also with several admissions in the last few months for GIB discharged to SNF, ASA and anticoagulation held temporarily,   Noted patient is now hospitalized due to fall with FTT.  Patient found in poor living conditions and APS case opened.  Per Education officer, museum notes, patient may be discharged to [facility].    [facility] pharmacy will coordinate care with [facility] inpatient liaison and [facility] social worker and follow-up as warranted after discharge.    Ralene Bathe, PharmD, [facility] [phone]

## 2017-05-06 NOTE — Progress Notes (Signed)
[facility] called. They will cal back with ETA.

## 2017-05-06 NOTE — Progress Notes (Signed)
Physical Therapy Treatment Patient Details Name: Tina West MRN: [id number] DOB: [birth year] Today's Date: 05/06/2017    History of Present Illness 84 year old woman admitted from home on 2/10 following a fall.  She has multiple medical comorbidities including systolic heart failure with an ejection fraction of 15%, had a cardiac catheterization in January with multi coronary artery disease and decision was made to treat medically.  She was found to have very poor living conditions at home and APS report has been opened.    PT Comments    Patient continues to be confused and lethargic during therapy but is agreeable to work. Patient requires Max Assist to Total Assist for bed mobility and supine to sit transfers. Session focused on improving activity tolerance to upright activity and repositioning patient for optimal alignment and posture. Her resting position continues to be semi-supine rotated to left side and complained of pain along left neck when PROM for repositioning was attempted. Patient's progress continues to be limited by complaints of pain and confused status. PT will continue to attempt further mobility and indepedence.      Follow Up Recommendations  SNF;Supervision/Assistance - 24 hour     Equipment Recommendations  None recommended by PT    Recommendations for Other Services       Precautions / Restrictions Precautions Precautions: Fall Restrictions Weight Bearing Restrictions: No    Mobility  Bed Mobility Overal bed mobility: Needs Assistance Bed Mobility: Rolling;Supine to Sit Rolling: Max assist   Supine to sit: Max assist;HOB elevated     General bed mobility comments: max to total assist for positioning and basic bed mobiltiy  Transfers    General transfer comment: did not attempt transfers today due to pateitn lethargy, pain, and weakness, unsure of patient's actual PLOF before admission.  Ambulation/Gait    General Gait Details: did not  attempt transfers today due to pateitn lethargy, pain, and weakness, unsure of patient's actual PLOF before admission.       Balance Overall balance assessment: History of Falls;Needs assistance Sitting-balance support: Feet supported;Single extremity supported Sitting balance-Leahy Scale: Poor   Postural control: Left lateral lean;Posterior lean   Standing balance-Leahy Scale: Zero Standing balance comment: did not attempt due to patient's pain and weakness       Cognition Arousal/Alertness: Awake/alert;Lethargic(confused) Behavior During Therapy: WFL for tasks assessed/performed Overall Cognitive Status: Difficult to assess    General Comments: generally confused and lethargic, frequent verbal cugin required to remain alert      Exercises General Exercises - Lower Extremity Ankle Circles/Pumps: AAROM;10 reps;Supine;Both        Pertinent Vitals/Pain Pain Assessment: Faces Faces Pain Scale: Hurts a little bit Pain Location: left neck Pain Descriptors / Indicators: Aching Pain Intervention(s): Limited activity within patient's tolerance;Monitored during session           PT Goals (current goals can now be found in the care plan section) Acute Rehab PT Goals Patient Stated Goal: not sure, patient willing to participate but unable to verbalize a goal specifically PT Goal Formulation: With patient Time For Goal Achievement: 05/12/17 Potential to Achieve Goals: Fair Progress towards PT goals: Not progressing toward goals - comment(more lethargic and confused )    Frequency    Min 3X/week      PT Plan Current plan remains appropriate       AM-PAC PT "6 Clicks" Daily Activity  Outcome Measure  Difficulty turning over in bed (including adjusting bedclothes, sheets and blankets)?: Unable Difficulty moving from lying on back to  sitting on the side of the bed? : Unable Difficulty sitting down on and standing up from a chair with arms (e.g., wheelchair, bedside  commode, etc,.)?: Unable Help needed moving to and from a bed to chair (including a wheelchair)?: Total Help needed walking in hospital room?: Total Help needed climbing 3-5 steps with a railing? : Total 6 Click Score: 6    End of Session   Activity Tolerance: Patient limited by lethargy;Patient tolerated treatment well Patient left: in bed;with call bell/phone within reach;with bed alarm set Nurse Communication: Mobility status;Other (comment) PT Visit Diagnosis: History of falling (Z91.81);Muscle weakness (generalized) (M62.81);Other abnormalities of gait and mobility (R26.89);Adult, failure to thrive (R62.7);Pain Pain - Right/Left: Left Pain - part of body: (neck, muscle tightness)     Time: 0936-1000 PT Time Calculation (min) (ACUTE ONLY): 24 min  Charges:  $Therapeutic Exercise: 23-37 mins                    G Codes:       Kipp Brood, PT, DPT Physical Therapist with [facility]  05/06/2017 6:05 PM

## 2017-05-06 NOTE — Progress Notes (Signed)
Daily Progress Note   Patient Name: Tina West       Date: 05/06/2017 DOB: 06/14/1928  Age: 84 y.o. MRN#: [id number] Attending Physician: Barton Dubois, MD Primary Care Physician: Claretta Fraise, MD Admit Date: 04/24/2017  Reason for Consultation/Follow-up: Establishing goals of care and Psychosocial/spiritual support  Subjective: [name] is resting quietly in bed.  She will briefly make eye contact, and does speak to me today.  She tells me "please, please".  She tells me frequently "I love you".  She also asks, "please love my son".  She has advanced dementia, and is unable to make any decisions for herself.  Call to grand daughter-in-law, healthcare power of attorney, Larisa Lanius, left voicemail message.  [name] would clearly benefit from residential hospice services.  PMT will continue to reach out to [place] related to these choices.  Temporary guardianship does not allow for DSS social workers to make end-of-life choices.  Please refer to Mrs.Eblin's advance directives found under the document list.  She did initial some choices, but DID NOT initial the choice that elected for tube feeding.  I believe that [name]'s advance directives clearly state that she would not want life prolonging measures if she were in this state.  PMT will try to support family in this decision.  Length of Stay: 10  Current Medications: Scheduled Meds:  . aspirin EC  81 mg Oral Daily  . atorvastatin  20 mg Oral q1800  . digoxin  0.125 mg Oral Daily  . donepezil  5 mg Oral QHS  . enoxaparin (LOVENOX) injection  60 mg Subcutaneous Q12H  . feeding supplement (ENSURE ENLIVE)  237 mL Oral BID BM  . free water  400 mL Per Tube Q8H  . levothyroxine  150 mcg Oral QAC breakfast  .  magic mouthwash  5 mL Oral QID   And  . lidocaine  5 mL Mouth/Throat QID  . metoprolol tartrate  50 mg Oral BID  . multivitamin with minerals  1 tablet Oral Daily  . sodium chloride flush  3 mL Intravenous Q12H    Continuous Infusions: . sodium chloride    . cefTRIAXone (ROCEPHIN)  IV Stopped (05/04/17 2137)  . dextrose 125 mL/hr at 05/06/17 1140  . feeding supplement (JEVITY 1.2 CAL) 1,000 mL (05/04/17 1534)    PRN Meds: sodium chloride,  acetaminophen **OR** acetaminophen, hydrALAZINE, LORazepam, ondansetron **OR** ondansetron (ZOFRAN) IV, senna-docusate, sodium chloride flush  Physical Exam  Constitutional: No distress.  Appears weak and frail, acutely/chronically ill.  Makes and briefly keeps eye contact.  Pulmonary/Chest: Effort normal. No respiratory distress.  Abdominal: Soft. She exhibits no distension.  Musculoskeletal: She exhibits no edema.  Neurological: She is alert.  Unable to determine orientation.  Skin: Skin is warm and dry.  Nursing note and vitals reviewed.           Vital Signs: BP (!) 162/74 (BP Location: Right Arm)   Pulse (!) 129   Temp 99.1 F (37.3 C)   Resp 17   Ht 5\' 9"  (1.753 m)   Wt 59.8 kg (131 lb 13.4 oz)   SpO2 93%   BMI 19.47 kg/m  SpO2: SpO2: 93 % O2 Device: O2 Device: Nasal Cannula O2 Flow Rate: O2 Flow Rate (L/min): 3 L/min  Intake/output summary:   Intake/Output Summary (Last 24 hours) at 05/06/2017 1436 Last data filed at 05/06/2017 0500 Gross per 24 hour  Intake 10 ml  Output 300 ml  Net -290 ml   LBM: Last BM Date: 05/04/17 Baseline Weight: Weight: 68 kg (150 lb) Most recent weight: Weight: 59.8 kg (131 lb 13.4 oz)       Palliative Assessment/Data:    Flowsheet Rows     Most Recent Value  Intake Tab  Referral Department  Hospitalist  Unit at Time of Referral  Med/Surg Unit  Palliative Care Primary Diagnosis  Cardiac  Date Notified  04/27/17  Palliative Care Type  New Palliative care  Reason for referral   Clarify Goals of Care  Date of Admission  04/19/2017  Date first seen by Palliative Care  04/27/17  # of days Palliative referral response time  0 Day(s)  # of days IP prior to Palliative referral  1  Clinical Assessment  Psychosocial & Spiritual Assessment  Palliative Care Outcomes      Patient Active Problem List   Diagnosis Date Noted  . Pressure injury of skin 04/29/2017  . Palliative care encounter   . FTT (failure to thrive) in adult 04/20/2017  . Hypokalemia 05/07/2017  . Hypomagnesemia 04/25/2017  . Acute on chronic systolic CHF (congestive heart failure) ([place]) 05/05/2017  . Dehydration 04/20/2017  . PAD (peripheral artery disease) ([place]) 03/16/2017  . Acute systolic CHF (congestive heart failure) ([place]) 03/14/2017  . Demand ischemia ([place]) 03/13/2017  . Acute on chronic diastolic (congestive) heart failure ([place]) 03/13/2017  . Thrombocytosis ([place]) 03/13/2017  . Protein-calorie malnutrition, severe 03/13/2017  . A-fib ([place]) 03/12/2017  . Left leg cellulitis 03/12/2017  . Hypothyroidism 03/12/2017  . Other specified hypothyroidism 01/12/2015  . Alzheimer's dementia 01/12/2015  . CARDIOMYOPATHY, ALCOHOLIC 50/11/3816  . Essential hypertension 04/05/2009  . PERSONAL HISTORY OF SUDDEN CARDIAC ARREST 04/05/2009    Palliative Care Assessment & Plan   Patient Profile: 84 y.o. female  with past medical history of dementia, mixed HF with an EF of 15-20%, PAD, obesity, and anxiety who was admitted on 05/12/2017 after a fall at home.  She was reported to be found on a mattress on the floor incontinent of urine and feces.  Admission work up showed a WBC of 25 and BNP of 2443.  Clinically she appeared volume depleted.    Assessment: Failure to thrive, dementia, UTI; being treated with IV antibiotics, lost IV site but was able to access midline IV.  Continues to be unable to take adequate nutrition, dysphasia 1 diet  ordered, but patient continues to pocket food.  GI does not recommend PEG  tube.  Recommendations/Plan:  Continue CODE STATUS discussions.  Goals of Care and Additional Recommendations:  Limitations on Scope of Treatment: Continue to treat the treatable, no CPR, drugs and intubation only  Code Status:    Code Status Orders  (From admission, onward)        Start     Ordered   04/28/17 1321  Limited resuscitation (code)  Continuous    Question Answer Comment  In the event of cardiac or respiratory ARREST: Initiate Code Blue, Call Rapid Response Yes   In the event of cardiac or respiratory ARREST: Perform CPR No   In the event of cardiac or respiratory ARREST: Perform Intubation/Mechanical Ventilation Yes   In the event of cardiac or respiratory ARREST: Use NIPPV/BiPAp only if indicated Yes   In the event of cardiac or respiratory ARREST: Administer ACLS medications if indicated Yes   In the event of cardiac or respiratory ARREST: Perform Defibrillation or Cardioversion if indicated No      04/28/17 1321    Code Status History    Date Active Date Inactive Code Status Order ID Comments User Context   04/28/2017 13:15 04/28/2017 13:21 Partial Code [id number]  Lorenso Quarry Inpatient   04/18/2017 18:09 04/28/2017 13:15 Full Code [id number]  Erline Hau, MD Inpatient   03/12/2017 18:14 03/27/2017 17:21 Full Code [id number]  Debbe Odea, MD ED    Advance Directive Documentation     Most Recent Value  Type of Advance Directive  Healthcare Power of Attorney  Pre-existing out of facility DNR order (yellow form or pink MOST form)  No data  "MOST" Form in Place?  No data       Prognosis:   < 3 months, or less would not be surprising based on current functional status severity of heart failure with a EF of 15% approximately, albumin of 2.1, high risk for recurrent aspiration.  Discharge Planning:  To be determined, seeking placement residential SNF  Care plan was discussed with nursing staff, case management, social worker, and  Dr. Dyann Kief.   Thank you for allowing the Palliative Medicine Team to assist in the care of this patient.   Time In:  1305 Time Out:  1345 Total Time  40 minutes Prolonged Time Billed  no       Greater than 50%  of this time was spent counseling and coordinating care related to the above assessment and plan.  Drue Novel, NP  Please contact Palliative Medicine Team phone at [phone] for questions and concerns.

## 2017-05-06 NOTE — Progress Notes (Addendum)
REVIEWED. AGREE. NO ADDITIONAL RECOMMENDATIONS.  Subjective:  Resting. NT states she would not swallow food, holds it in her mouth.   Objective: Vital signs in last 24 hours: Temp:  [97.8 F (36.6 C)-99.1 F (37.3 C)] 99.1 F (37.3 C) (02/20 0645) Pulse Rate:  [73-129] 129 (02/20 0645) Resp:  [17-21] 17 (02/20 0645) BP: (160-162)/(74-92) 162/74 (02/20 0645) SpO2:  [93 %-100 %] 93 % (02/20 0645) Last BM Date: 05/04/17 General:   Sleeping. Arouses with touch. NAD Head:  Normocephalic and atraumatic. Abdomen:  Soft, nondistended.   Extremities:  Without clubbing, deformity or edema. Neurologic:  drowsy  Intake/Output from previous day: 02/19 0701 - 02/20 0700 In: 10 [I.V.:10] Out: 800 [Urine:800] Intake/Output this shift: No intake/output data recorded.  Lab Results: CBC Recent Labs    05/04/17 0720 05/05/17 0537 05/06/17 0621  WBC 18.8* 17.2* 16.1*  HGB 9.9* 10.0* 9.2*  HCT 34.4* 36.0 32.7*  MCV 86.4 88.5 88.1  PLT 927* 968* 972*   BMET Recent Labs    05/05/17 0537 05/05/17 1641 05/06/17 0621  NA 156* 150* 155*  K 3.6 3.9 3.8  CL 114* 113* 116*  CO2 31 28 29   GLUCOSE 122* 138* 110*  BUN 46* 41* 38*  CREATININE 0.75 0.69 0.74  CALCIUM 8.3* 7.9* 8.1*   LFTs Recent Labs    05/05/17 1641  BILITOT 1.0  BILIDIR 0.3  IBILI 0.7  ALKPHOS 90  AST 53*  ALT 70*  PROT 4.8*  ALBUMIN 2.1*   No results for input(s): LIPASE in the last 72 hours. PT/INR No results for input(s): LABPROT, INR in the last 72 hours.    Imaging Studies: Dg Chest 2 View  Result Date: 04/18/2017 CLINICAL DATA:  weakness EXAM: CHEST  2 VIEW COMPARISON:  03/19/2017 FINDINGS: Normal cardiac silhouette. Dextroscoliosis of the thoracic spine. Lungs are clear. No pneumothorax. Small LEFT effusion is noted on lateral radiograph. IMPRESSION: 1. Small LEFT effusion. 2. Dextroscoliosis of thoracic spine Electronically Signed   By: Suzy Bouchard M.D.   On: 05/04/2017 12:11   Ct Head Wo  Contrast  Result Date: 05/05/2017 CLINICAL DATA:  Fall 05/11/2017.  Altered mental status. EXAM: CT HEAD WITHOUT CONTRAST TECHNIQUE: Contiguous axial images were obtained from the base of the skull through the vertex without intravenous contrast. COMPARISON:  05/07/2017 FINDINGS: Brain: There is atrophy and chronic small vessel disease changes. No acute intracranial abnormality. Specifically, no hemorrhage, hydrocephalus, mass lesion, acute infarction, or significant intracranial injury. Vascular: No hyperdense vessel or unexpected calcification. Skull: No acute calvarial abnormality. Sinuses/Orbits: Diffuse sinus disease with mucosal thickening diffusely and air-fluid levels in the left maxillary and right sphenoid sinus. Other: None IMPRESSION: No acute intracranial abnormality. Atrophy, chronic microvascular disease. Acute on chronic sinusitis. Electronically Signed   By: Rolm Baptise M.D.   On: 05/05/2017 08:56   Ct Head Wo Contrast  Result Date: 04/25/2017 CLINICAL DATA:  84 year old female with fall and head injury. EXAM: CT HEAD WITHOUT CONTRAST TECHNIQUE: Contiguous axial images were obtained from the base of the skull through the vertex without intravenous contrast. COMPARISON:  None. FINDINGS: Brain: No evidence of acute infarction, hemorrhage, hydrocephalus, extra-axial collection or mass lesion/mass effect. Atrophy, chronic small-vessel white matter ischemic changes and remote right basal ganglia infarct noted. Vascular: Atherosclerotic calcifications noted. Skull: Normal. Negative for fracture or focal lesion. Sinuses/Orbits: No acute finding. Other: None. IMPRESSION: 1. No evidence of acute intracranial abnormality 2. Atrophy, chronic small-vessel white matter ischemic changes and remote right basal ganglia infarct. Electronically  Signed   By: Margarette Canada M.D.   On: 04/21/2017 12:04   Dg Chest Port 1 View  Result Date: 05/04/2017 CLINICAL DATA:  NG tube placement EXAM: PORTABLE CHEST 1 VIEW  COMPARISON:  04/17/2017 FINDINGS: Minimal blunting of the left costophrenic angle, raising the possibility of a trace left pleural effusion. No frank interstitial edema. No pneumothorax. The heart is top-normal in size. Enteric tube terminates in the proximal gastric body. IMPRESSION: Enteric tube terminates in the proximal gastric body. Electronically Signed   By: Julian Hy M.D.   On: 05/04/2017 12:59  [2 weeks]   Assessment: 84 year old female with multiple co-morbidities, admitted with failure to thrive, malnutrition, dehydration, UTI, worsening hypernatremia. GI consulted for PEG tube placement consideration as she was unable to tolerate NG tube for enteral feeds, free water flushes, and medications. Unfortunately, she was found to be in poor living conditions at home and now interim guardianship has been provided by DSS, contact person Glennie Hawk [phone], ext [phone]).   I reviewed chart from prior hospitalizations, and she has had considerable decline in health. EF 15-20%, on chronic anticoagulation secondary to afib.  There was also some question of duodenitis or PUD on CT imaging in Dec 2018, and elevated LFTs at that time were felt to be secondary to heart failure and were trending down. Last LFTs 2/10 on admission with isolated elevated bilirubin at 4.2 but normal as of yesterday.   Patient not felt to be a PEG candidate due to high risk of pulling it out before tract matures (4-6 weeks).     Plan: 1. Consider having palliative see patient again especially if oral intake does not improve with correction of hypernatremia.  2. Management of thrombocytosis per attending.  3. Will follow peripherally. Call with questions.   Laureen Ochs. Bernarda Caffey [facility] [phone] 2/20/201910:35 AM     LOS: 10 days

## 2017-05-07 DIAGNOSIS — Z7189 Other specified counseling: Secondary | ICD-10-CM

## 2017-05-07 NOTE — Progress Notes (Signed)
Daily Progress Note   Patient Name: Tina West       Date: 05/07/2017 DOB: 20-Feb-1929  Age: 84 y.o. MRN#: [id number] Attending Physician: Barton Dubois, MD Primary Care Physician: Claretta Fraise, MD Admit Date: 05/07/2017  Reason for Consultation/Follow-up: Establishing goals of care, Hospice Evaluation and Psychosocial/spiritual support  Subjective: Tina West is resting quietly in bed.  She greets me making and keeping eye contact.  She looks much improved from yesterday.  Present today at bedside is grand daughter-in-law Abigail Butts and Tina West's son Wille Glaser.  We talked about Tina West's improvements.  Abigail Butts states that she feels this is a Corporate investment banker.  Abigail Butts shares that she will not be here over the weekend and she expects [name] condition to deteriorate.  She shares that often, Tina West will not interact or take food or liquids from people who are not family.  Abigail Butts and I talk about the MOST form.  Listed in the document section of epic is a signed and notarized healthcare power of attorney listing Alantra Popoca as Tina West's healthcare surrogate.  (03/16/17) Abigail Butts completes the most form electing for comfort measures, no further IVs, no further antibiotics.  Call to [place] social worker, Billey Co at [phone].  Left voicemail message requesting return call.  Conference with nursing staff related to plan of care, prognosis. Conference with social worker related to disposition. Conference with hospitalist, Dr. Dyann Kief, related to plan of care, CODE STATUS, disposition.  Length of Stay: 11  Current Medications: Scheduled Meds:  . enoxaparin (LOVENOX) injection  60 mg Subcutaneous Q12H  . feeding supplement (ENSURE ENLIVE)  237 mL Oral BID BM    . free water  400 mL Per Tube Q8H  . levothyroxine  50 mcg Intravenous Daily  . magic mouthwash  5 mL Oral QID   And  . lidocaine  5 mL Mouth/Throat QID  . metoprolol tartrate  2.5 mg Intravenous Q8H  . multivitamin with minerals  1 tablet Oral Daily    Continuous Infusions: . dextrose 125 mL/hr at 05/07/17 1050    PRN Meds: acetaminophen **OR** acetaminophen, fentaNYL (SUBLIMAZE) injection, hydrALAZINE, LORazepam, ondansetron **OR** ondansetron (ZOFRAN) IV, senna-docusate  Physical Exam  Constitutional: No distress.  HENT:  Head: Atraumatic.  Pulmonary/Chest: Effort normal. No respiratory distress.  Abdominal: Soft. She exhibits no distension.  Musculoskeletal:  She exhibits no edema.  Neurological: She is alert.  Skin: Skin is warm and dry.  Nursing note and vitals reviewed.           Vital Signs: BP (!) 144/58 (BP Location: Right Arm)   Pulse (!) 55   Temp 98.2 F (36.8 C) (Axillary)   Resp 20   Ht 5\' 9"  (1.753 m)   Wt 59.8 kg (131 lb 13.4 oz)   SpO2 100%   BMI 19.47 kg/m  SpO2: SpO2: 100 % O2 Device: O2 Device: Nasal Cannula O2 Flow Rate: O2 Flow Rate (L/min): 2 L/min  Intake/output summary:   Intake/Output Summary (Last 24 hours) at 05/07/2017 1515 Last data filed at 05/07/2017 1503 Gross per 24 hour  Intake 6920.42 ml  Output 850 ml  Net 6070.42 ml   LBM: Last BM Date: 05/06/17 Baseline Weight: Weight: 68 kg (150 lb) Most recent weight: Weight: 59.8 kg (131 lb 13.4 oz)       Palliative Assessment/Data:    Flowsheet Rows     Most Recent Value  Intake Tab  Referral Department  Hospitalist  Unit at Time of Referral  Med/Surg Unit  Palliative Care Primary Diagnosis  Cardiac  Date Notified  04/27/17  Palliative Care Type  New Palliative care  Reason for referral  Clarify Goals of Care  Date of Admission  05/09/2017  Date first seen by Palliative Care  04/27/17  # of days Palliative referral response time  0 Day(s)  # of days IP prior to Palliative  referral  1  Clinical Assessment  Psychosocial & Spiritual Assessment  Palliative Care Outcomes      Patient Active Problem List   Diagnosis Date Noted  . Pressure injury of skin 04/29/2017  . Palliative care encounter   . FTT (failure to thrive) in adult 05/08/2017  . Hypokalemia 04/29/2017  . Hypomagnesemia 04/27/2017  . Acute on chronic systolic CHF (congestive heart failure) ([place]) 04/22/2017  . Dehydration 04/23/2017  . PAD (peripheral artery disease) ([place]) 03/16/2017  . Acute systolic CHF (congestive heart failure) ([place]) 03/14/2017  . Demand ischemia ([place]) 03/13/2017  . Acute on chronic diastolic (congestive) heart failure ([place]) 03/13/2017  . Thrombocytosis ([place]) 03/13/2017  . Protein-calorie malnutrition, severe 03/13/2017  . A-fib ([place]) 03/12/2017  . Left leg cellulitis 03/12/2017  . Hypothyroidism 03/12/2017  . Other specified hypothyroidism 01/12/2015  . Alzheimer's dementia 01/12/2015  . CARDIOMYOPATHY, ALCOHOLIC 20/25/4270  . Essential hypertension 04/05/2009  . PERSONAL HISTORY OF SUDDEN CARDIAC ARREST 04/05/2009    Palliative Care Assessment & Plan   Patient Profile: 84 y.o.femalewith past medical history of dementia, mixed HF with an EF of 15-20%, PAD, obesity, and anxietywho was admitted on 2/10/2019after a fall at home. She was reported to be found on a mattress on the floor incontinent of urine and feces. Admission work up showed a WBC of 25 and BNP of 2443. Clinically she appeared volume depleted.   Assessment: Failure to thrive, dementia, UTI; being treated with IV antibiotics, lost IV site but was able to access midline IV.  Continues to be unable to take adequate nutrition, dysphasia 1 diet ordered, but patient continues to pocket food.  GI does not recommend PEG tube.  Recommendations/Plan:  Call to [place] social worker Antonietta Breach.  Left message.  I will continue to reach out to [facility] tomorrow.  Continue to work for disposition.   Family would like placement and residential hospice.  I am not sure that Tina West would qualify at this  time.  I believe that she would qualify for residential hospice after 2 days without IV fluid, as she is unable to eat and drink enough to maintain life at this point.  Goals of Care and Additional Recommendations:  Limitations on Scope of Treatment: Family is requesting full comfort care, most form completed, will verify with temporary guardianship, DSS 2/22.  Code Status:    Code Status Orders  (From admission, onward)        Start     Ordered   05/07/17 1354  Do not attempt resuscitation (DNR)  Continuous    Question Answer Comment  In the event of cardiac or respiratory ARREST Do not call a "code blue"   In the event of cardiac or respiratory ARREST Do not perform Intubation, CPR, defibrillation or ACLS   In the event of cardiac or respiratory ARREST Use medication by any route, position, wound care, and other measures to relive pain and suffering. May use oxygen, suction and manual treatment of airway obstruction as needed for comfort.      05/07/17 1353    Code Status History    Date Active Date Inactive Code Status Order ID Comments User Context   04/28/2017 13:21 05/07/2017 13:53 Partial Code [id number]  Lorenso Quarry Inpatient   04/28/2017 13:15 04/28/2017 13:21 Partial Code [id number]  Lorenso Quarry Inpatient   04/29/2017 18:09 04/28/2017 13:15 Full Code [id number]  Erline Hau, MD Inpatient   03/12/2017 18:14 03/27/2017 17:21 Full Code [id number]  Debbe Odea, MD ED    Advance Directive Documentation     Most Recent Value  Type of Advance Directive  Healthcare Power of Attorney  Pre-existing out of facility DNR order (yellow form or pink MOST form)  No data  "MOST" Form in Place?  No data       Prognosis:   < 6 weeks, or less would not be surprising based on functional status, albumin 2.1, frailty, history of 2 hospitalizations in  6 months.  Discharge Planning:  To be determined, family is requesting residential hospice.  Care plan was discussed with nursing staff, social worker, and Dr. Dyann Kief.  Thank you for allowing the Palliative Medicine Team to assist in the care of this patient.   Time In: 1310 Time Out: 1450 Total Time 100 minutes Prolonged Time Billed  yes       Greater than 50%  of this time was spent counseling and coordinating care related to the above assessment and plan.  Drue Novel, NP  Please contact Palliative Medicine Team phone at [phone] for questions and concerns.
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
## 2017-05-07 NOTE — Care Management Note (Signed)
Case Management Note  Patient Details  Name: Tina West MRN: [id number] Date of Birth: [birth year]  If discussed at Long Length of Stay Meetings, dates discussed:  05/07/17  Additional Comments:  Sherald Barge, RN 05/07/2017, 2:05 PM

## 2017-05-07 NOTE — Progress Notes (Signed)
Pt given two cups of thickened water and tea by family member. She was able to consume those without difficulty, no choking, coughing, or sob. Pt then requested a jello cup and ate that as well, also without difficulty.

## 2017-05-08 MED ORDER — BISACODYL 10 MG RE SUPP: 10.0000 mg | Freq: Every day | RECTAL | Status: DC | PRN
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
## 2017-05-08 NOTE — Clinical Social Work Note (Signed)
Patient Information   Patient Name Anavi, Branscum ([id number]) Sex Female DOB Mar 17, 1929 SSN [id number]   Room Bed  A333 A333-01  Patient Demographics   Address [place] [address] Phone [phone] (Home)  Patient Ethnicity & Race   Ethnic Group Patient Race  Not Hispanic or Latino White or Caucasian  Emergency Contact(s)   Name Relation Home Work Mobile  [place] Grandaughter   [phone]  Ravon, [place] [phone]  [phone]  Documents on File    Status Date Received Description  Documents for the Patient  EMR Medication Summary  03/09/10   EMR Problem Summary  02/22/10   EMR Patient Summary  02/28/10   HIM ROI Authorization  11/23/12 [employer] HEDIS Review  HIM ROI Authorization  10/10/13 [employer] Medicare Risk Adjustment Review 2015  [place] E-Signature HIPAA Notice of Privacy     [place] E-Signature HIPAA Notice of Privacy [place] Not Received    Driver's License Received 61/60/73 [facility]  Insurance Card Received 07/14/14 [place]  Advance Directives/Living Will/HCPOA/POA Not Received    Advanced Beneficiary Notice (ABN) Not Received    E-Signature AOB Spanish Not Received    Other Photo ID Not Received    HIM ROI Authorization  02/05/15 [place] Dept requests last 2 visits  Release of Information  02/16/15   Insurance Card Received 04/18/15 [email or web address]  Advance Directives/Living Will/HCPOA/POA Received 03/18/17   Advance Directives/Living Will/HCPOA/POA Received 03/28/17   AMB Intake Forms/Questionnaires Received 04/20/17 02/19 [facility] REFERRAL HUMANA  AMB HH/NH/Hospice Received 03/27/17 D/S PLAN&SUMMARY [facility] [place]  AMB HH/NH/Hospice Received 04/14/17 D/S [facility] [place]  AMB HH/NH/Hospice Received 04/17/17 TRANSITION OF CARE [facility]  AMB HH/NH/Hospice Received 04/15/17 ADMISSON RECORD [facility]  AMB HH/NH/Hospice Received 03/27/17 D/C  MEDICATION/TRANSFER RECORD [facility]  HIM ROI Authorization  05/05/17 [facility]  HIM Release of Information Output  04/17/17 Notes Individual  HIM Release of Information Output  05/05/17 Notes Individual, Orders/Results Individual, [facility] Fax Coversheet  Documents for the Encounter  AOB (Assignment of Insurance Benefits) Received 04/22/2017 Unable to sign due to AMS  E-signature AOB     MEDICARE RIGHTS Received 04/30/2017 Unable to sign due to AMS  E-signature Medicare Rights     Cardiac Monitoring Strip Received 04/28/2017   EMS Run Sheet Received 04/24/2017   Cardiac Monitoring Strip Shift Summary Received 04/25/2017   ED Patient Billing Extract   ED PB Billing Extract  EKG Received (Deleted) 04/27/17   EKG Received 04/27/17   Admission Information   Attending Provider Admitting Provider Admission Type Admission Date/Time  Barton Dubois, MD Erline Hau, MD Emergency 05/12/2017 0945  Discharge Date Hospital Service Auth/Cert Status Service Area   Internal Medicine Incomplete [place]  Unit Room/Bed Admission Status   AP-DEPT 300 A333/A333-01 Admission (Confirmed)   Admission   Complaint  [facility] Account   Name Acct ID Class Status Primary Coverage  [place], [place] [id number] Inpatient Open B and E PPO      Guarantor Account (for [place] [id number])   Name Relation to Pt Service Area Active? Acct Type  Achilles Dunk Self CHSA Yes Personal/Family  Address Phone    [address] [place], [place] [address] [phone]        Coverage Information (for Hospital Account [id number])   F/O Payor/Plan Precert #  [employer] [place] MEDICARE CHOICE PPO   Subscriber  Subscriber #  Jaskir[place], Pata [id number]  Address Phone  [place] [address] [phone]

## 2017-05-08 NOTE — Progress Notes (Signed)
Daily Progress Note   Patient Name: Tina West       Date: 05/08/2017 DOB: [birth year]  Age: 84 y.o. MRN#: [id number] Attending Physician: Barton Dubois, MD Primary Care Physician: Claretta Fraise, MD Admit Date: 05/01/2017  Reason for Consultation/Follow-up: Establishing goals of care, Inpatient hospice referral, Psychosocial/spiritual support and Withdrawal of life-sustaining treatment  Subjective: Call from Tina West.  We talked about temporary guardianship for Tina West.  We also talked about healthcare power of attorney listed in [place] from 03/16/17.  Tina West states she will pass information to her supervisor, Tina West who will call with questions.  Tina West calls.  We have further discussion related to Tina West's case, disposition.  Tina West is resting quietly in bed.  She will make an briefly keep eye contact.  She is calm and cooperative.  She looks more weak and frail than yesterday.  There is no family at bedside at this time.  She is able to take a sip of liquid, but tells me she does not want anything to eat.  Conference with nursing staff related to care and disposition. Conference with hospitalist, Tina West related to care and disposition. Return call from Tina West who states that Tina agrees to residential hospice referral with Tina West. Conference with Tina West related to hospice referral. Tina West daughter-in-law Tina West at [phone] (or [phone]).  We talked about referral to residential hospice.  Family is agreeable.  Tina West states that her husband is having a difficult time with his grandmother's decline and expected passing.  She shares that she has given him "hard choices for loving People"  booklet.   Length of Stay: 12  Current Medications: Scheduled Meds:  . feeding supplement (ENSURE ENLIVE)  237 mL Oral BID BM  . levothyroxine  50 mcg Intravenous Daily  . magic mouthwash  5 mL Oral QID   And  . lidocaine  5 mL Mouth/Throat QID  . metoprolol tartrate  2.5 mg Intravenous Q8H    Continuous Infusions:   PRN Meds: acetaminophen **OR** acetaminophen, fentaNYL (SUBLIMAZE) injection, hydrALAZINE, LORazepam, ondansetron **OR** ondansetron (ZOFRAN) IV, senna-docusate  Physical Exam  Constitutional: No distress.  Appears frail, acutely/chronically ill  HENT:  Head: Atraumatic.  Temporal wasting  Cardiovascular: Normal rate.  Pulmonary/Chest: Effort normal. No respiratory distress.  Abdominal: Soft. She  exhibits no distension.  Musculoskeletal: She exhibits no edema.  Muscle wasting  Neurological: She is alert.  Oriented to self only, makes and briefly keeps eye contact  Skin: Skin is warm and dry.  Psychiatric:  Known dementia, but able to make her basic needs known at this time.  Nursing note and vitals reviewed.           Vital Signs: BP 113/77 (BP Location: Right Arm)   Pulse (!) 156 Comment: per pulse oximeter, unable to palpate a brachial pulse or radial pulse with consistency.    Temp 98.4 F (36.9 C) (Oral)   Resp 20   Ht 5\' 9"  (1.753 m)   Wt 59.8 kg (131 lb 13.4 oz)   SpO2 98%   BMI 19.47 kg/m  SpO2: SpO2: 98 % O2 Device: O2 Device: Nasal Cannula O2 Flow Rate: O2 Flow Rate (L/min): 2 L/min  Intake/output summary:   Intake/Output Summary (Last 24 hours) at 05/08/2017 1228 Last data filed at 05/08/2017 1109 Gross per 24 hour  Intake 2861.25 ml  Output 1050 ml  Net 1811.25 ml   LBM: Last BM Date: 05/07/17 Baseline Weight: Weight: 68 kg (150 lb) Most recent weight: Weight: 59.8 kg (131 lb 13.4 oz)       Palliative Assessment/Data:    Flowsheet Rows     Most Recent Value  Intake Tab  Referral Department  Hospitalist  Unit at Time of  Referral  Med/Surg Unit  Palliative Care Primary Diagnosis  Cardiac  Date Notified  04/27/17  Palliative Care Type  New Palliative care  Reason for referral  Clarify Goals of Care  Date of Admission  04/22/2017  Date first seen by Palliative Care  04/27/17  # of days Palliative referral response time  0 Day(s)  # of days IP prior to Palliative referral  1  Clinical Assessment  Psychosocial & Spiritual Assessment  Palliative Care Outcomes      Patient Active Problem List   Diagnosis Date Noted  . Counseling regarding advanced care planning and goals of care   . Encounter for hospice care discussion   . Pressure injury of skin 04/29/2017  . Palliative care encounter   . FTT (failure to thrive) in adult 04/18/2017  . Hypokalemia 05/11/2017  . Hypomagnesemia 04/30/2017  . Acute on chronic systolic CHF (congestive heart failure) ([place]) 05/12/2017  . Dehydration 04/24/2017  . PAD (peripheral artery disease) ([place]) 03/16/2017  . Acute systolic CHF (congestive heart failure) ([place]) 03/14/2017  . Demand ischemia ([place]) 03/13/2017  . Acute on chronic diastolic (congestive) heart failure ([place]) 03/13/2017  . Thrombocytosis ([place]) 03/13/2017  . Protein-calorie malnutrition, severe 03/13/2017  . A-fib ([place]) 03/12/2017  . Left leg cellulitis 03/12/2017  . Hypothyroidism 03/12/2017  . Other specified hypothyroidism 01/12/2015  . Alzheimer's dementia 01/12/2015  . CARDIOMYOPATHY, ALCOHOLIC 18/29/9371  . Essential hypertension 04/05/2009  . PERSONAL HISTORY OF SUDDEN CARDIAC ARREST 04/05/2009    Palliative Care Assessment & Plan   Patient Profile: 84 y.o.femalewith past medical history of dementia, mixed HF with an EF of 15-20%, PAD, obesity, and anxietywho was admitted on 2/10/2019after a fall at home. She was reported to be found on a mattress on the floor incontinent of urine and feces. Admission work up showed a WBC of 25 and BNP of 2443. Clinically she appeared volume  depleted.  Assessment: Failure to thrive,dementia, UTI;being treated with IV antibiotics, lost IV site but was able to access midline IV. Continues to be unable to take adequate nutrition,  dysphasia 1 diet ordered, but patient continues to pocket food. GI does not recommend PEG tube.  Recommendations/Plan:  Call to [place].  Return call from supervisor Tina West.  We talked about hospice residential placement, and Tina's a role with temporary guardianship, and end-of-life choices.  Family would like placement in residential hospice.  I believe that she would qualify for residential hospice after 2 days without IV fluid, as she is unable to eat and drink enough to maintain life at this point.  Goals of Care and Additional Recommendations:  Limitations on Scope of Treatment: Full Comfort Care  Code Status:    Code Status Orders  (From admission, onward)        Start     Ordered   05/07/17 1354  Do not attempt resuscitation (DNR)  Continuous    Question Answer Comment  In the event of cardiac or respiratory ARREST Do not call a "code blue"   In the event of cardiac or respiratory ARREST Do not perform Intubation, CPR, defibrillation or ACLS   In the event of cardiac or respiratory ARREST Use medication by any route, position, wound care, and other measures to relive pain and suffering. May use oxygen, suction and manual treatment of airway obstruction as needed for comfort.      05/07/17 1353    Code Status History    Date Active Date Inactive Code Status Order ID Comments User Context   04/28/2017 13:21 05/07/2017 13:53 Partial Code [id number]  Lorenso Quarry Inpatient   04/28/2017 13:15 04/28/2017 13:21 Partial Code [id number]  Lorenso Quarry Inpatient   05/01/2017 18:09 04/28/2017 13:15 Full Code [id number]  Erline Hau, MD Inpatient   03/12/2017 18:14 03/27/2017 17:21 Full Code [id number]  Debbe Odea, MD ED     Advance Directive Documentation     Most Recent Value  Type of Advance Directive  Healthcare Power of Attorney  Pre-existing out of facility DNR order (yellow form or pink MOST form)  No data  "MOST" Form in Place?  No data       Prognosis:   < 4 weeks, would not be surprising based on albumin 2.1, frailty, functional status, 2 recent hospitalizations.  Discharge Planning:  Family is requesting comfort and dignity at end of life, residential hospice with [facility].   Care plan was discussed with nursing staff, social West, Tina West, [place] social workers [place] and Bobette West.  Thank you for allowing the Palliative Medicine Team to assist in the care of this patient.   Time In: 0920 Time Out: 1030 Total Time 70 minutes Prolonged Time Billed  yes       Greater than 50%  of this time was spent counseling and coordinating care related to the above assessment and plan.  Drue Novel, NP  Please contact Palliative Medicine Team phone at [phone] for questions and concerns.

## 2017-05-08 NOTE — Clinical Social Work Note (Signed)
At request of Palliative Care NP a referral was made to [facility].     Tina West, Clydene Pugh, LCSW

## 2017-05-08 NOTE — Progress Notes (Signed)
PROGRESS NOTE    Tina West  URK:[id number] DOB: Jul 24, 1928 DOA: 05/12/2017 PCP: Claretta Fraise, MD   Brief Narrative:   This is an 84 year old female who was admitted from home on 2/10 following a fall.  She was found to have very poor living conditions at home and an APS report has been opened.  She has been diagnosed with failure to thrive with severe protein calorie malnutrition as well as some dehydration in the setting of Alzheimer's dementia.  She has multiple medical comorbidities including systolic heart failure with an EF of 15%.  She was initially noted to have some hyponatremia that has now switched to hypernatremia.  Additionally, she appears to have poor swallow capacity and speech saw today with recommendations for dysphagia 1 diet. She had NGT placed yesterday, but pulled this out. GI consulted to evaluate patient who does not recommend PEG tube at this time. She is noted to have a UTI for which she has been started on IV Rocephin with urine notable for E. coli and Proteus.  She continues to have trouble with hypernatremia for which D5W rate has been increased.  Assessment & Plan:   Principal Problem:   FTT (failure to thrive) in adult Active Problems:   Other specified hypothyroidism   Alzheimer's dementia   Hypothyroidism   Hypokalemia   Hypomagnesemia   Acute on chronic systolic CHF (congestive heart failure) (HCC)   Dehydration   Palliative care encounter   Pressure injury of skin   Counseling regarding advanced care planning and goals of care   Encounter for hospice care discussion   1. Failure to thrive/severe protein calorie malnutrition with concurrent dysphagia. Speech therapy advise for high risk of aspiration, but will allow for dysphagia 1 and nectar thick liquids when fully awake/alert. Not a candidate for PEG. Now pursuing comfort measures.  2. Severe hypernatremia suspicious for DI.  Following [place] discussion will pursuit comfort care and will stop  IVF's. Will check BMET in am to assess true capacity of maintaining adequate hydration and nutrition without support.  3. AKI- overall improved. IVF's discontinued. Patient encourage to do her best with PO hydration and nutrition.  4. UTI- due to E Coli (resistant to ciprofloxacin) and Proteus (resistant to Nitrofurantoin); no fever. Will complete 7 days of rocephin on 2/20. Will observe off abx's. Currently afebrile.  5. Chronic systolic heart failure with EF 15-20%. Without alarming signs for hypervolemia. Follow daily weights. IVF's now discontinued.  6. Acute on chronic AMS in the setting of Alzheimer's dementia. In a patient with hypernatremia and UTI. Aricept discontinued. Will pursuit comfort care. 7. Hypothyroidism. Synthroid Now discontinued, in the setting of full comfort care. 8. History of atrial fibrillation. Was previously on Eliquis, now pursuing comfort care as per [place] discussion by palliative care, will focus on comfort care and stop any meds not intended for comfort.  9. Pressure injuries of skin to bilateral buttocks, lumbar spine, and feet: in the setting of poor nutrition and lack of adequate mobility prior to admission. Will continue preventive measures and constant repositioning.   DVT prophylaxis: Lovenox  Code Status: Partial; ok with intubation Family Communication: None at bedside Disposition Plan: Patient has guardianship with the department of social services and is eligible to go [facility] in [place] once she is stable for discharge. Due to further active decline in her condition palliative care consulted and has discuss with POA; decision has been to pursued comfort care and no heroic intervention.  IV fluids has been  discontinue and her diet advanced to dysphagia 1.  Will follow clinical response.   Consultants:   Palliative Care  Procedures:   See below for x-ray reports   Antimicrobials:   Rocephin 2/14->2/20 (completed 7 days of  antibiotics)   Subjective: Alert, awake and oriented x1; very poor insight.  Has been able to tolerate some of her Ensure and water without coughing or difficulties.  Still overall without significant nutritional intake and very deconditioned physically speaking.  Objective: Vitals:   05/07/17 2049 05/08/17 0442 05/08/17 1129 05/08/17 1326  BP: 97/73 113/77  (!) 148/75  Pulse: 88 90 (!) 156 (!) 117  Resp: 20 20  18   Temp: 97.8 F (36.6 C) 98.4 F (36.9 C)  97.6 F (36.4 C)  TempSrc: Oral Oral  Oral  SpO2: 100% 100% 98% 100%  Weight:      Height:        Intake/Output Summary (Last 24 hours) at 05/08/2017 1502 Last data filed at 05/08/2017 1327 Gross per 24 hour  Intake 2861.25 ml  Output 1050 ml  Net 1811.25 ml   Filed Weights   04/18/2017 1432 05/01/17 0649 05/01/17 1350  Weight: 53.5 kg (117 lb 15.1 oz) 55.1 kg (121 lb 7.6 oz) 59.8 kg (131 lb 13.4 oz)    Examination: General exam: Oriented x1, still confused and with very poor insight.  More conservative and awake.  Per nursing staff able to tolerate Ensure and some liquids without coughing or difficulties.  Denies chest pain, abdominal pain, dysuria or any other complaints.   Respiratory system: Good air movement bilaterally, no using accessory muscles, normal respiratory effort.  Cardiovascular system: S1 and S2, positive systolic ejection murmur, no rubs, no gallops, no JVD. Gastrointestinal system: Soft, nontender, positive bowel sounds, no distention appreciated. Central nervous system: Cranial nerves grossly intact, patient oriented to person only.  Able to move 4 limbs spontaneously (with increase movement in her upper extremities bilaterally in comparison to her lower extremities) Extremities: Muscle strength 3 out of 5 bilaterally in her lower extremities secondary to poor effort and deconditioning; trace edema appreciated.  No cyanosis Skin: Multiple stage I pressure injuries appreciated with no signs of superimposed  infection, affecting bilateral buttocks, lumbar spine area and both heels.  Data Reviewed: I have personally reviewed following labs and imaging studies  CBC: Recent Labs  Lab 05/03/17 0336 05/04/17 0720 05/05/17 0537 05/06/17 0621 05/06/17 1604  WBC 17.7* 18.8* 17.2* 16.1* 13.6*  HGB 10.4* 9.9* 10.0* 9.2* 9.4*  HCT 35.2* 34.4* 36.0 32.7* 32.6*  MCV 84.2 86.4 88.5 88.1 87.6  PLT 806* 927* 968* 972* 443*   Basic Metabolic Panel: Recent Labs  Lab 05/04/17 0720 05/04/17 1349 05/05/17 0537 05/05/17 1641 05/06/17 0621  NA 162* 158* 156* 150* 155*  K 4.5 4.0 3.6 3.9 3.8  CL 119* 118* 114* 113* 116*  CO2 31 31 31 28 29   GLUCOSE 148* 158* 122* 138* 110*  BUN 57* 51* 46* 41* 38*  CREATININE 0.87 0.89 0.75 0.69 0.74  CALCIUM 8.5* 8.2* 8.3* 7.9* 8.1*   GFR: Estimated Creatinine Clearance: 47.7 mL/min (by C-G formula based on SCr of 0.74 mg/dL).   Liver Function Tests: Recent Labs  Lab 05/05/17 1641  AST 53*  ALT 70*  ALKPHOS 90  BILITOT 1.0  PROT 4.8*  ALBUMIN 2.1*     Recent Results (from the past 240 hour(s))  Culture, Urine     Status: Abnormal   Collection Time: 04/30/17 11:07 AM  Result Value  Ref Range Status   Specimen Description   Final    URINE, CLEAN CATCH Performed at [facility], [address] [place], [place] [address]    Special Requests   Final    NONE Performed at [facility], [address], [place], [place] [address]    Culture (A)  Final    >=100,000 COLONIES/mL ESCHERICHIA COLI 50,000 COLONIES/mL PROTEUS MIRABILIS    Report Status 05/04/2017 FINAL  Final   Organism ID, Bacteria ESCHERICHIA COLI (A)  Final   Organism ID, Bacteria PROTEUS MIRABILIS (A)  Final      Susceptibility   Escherichia coli - MIC*    AMPICILLIN 8 SENSITIVE Sensitive     CEFAZOLIN <=4 SENSITIVE Sensitive     CEFTRIAXONE <=1 SENSITIVE Sensitive     CIPROFLOXACIN >=4 RESISTANT Resistant     GENTAMICIN <=1 SENSITIVE Sensitive     IMIPENEM <=0.25 SENSITIVE  Sensitive     NITROFURANTOIN <=16 SENSITIVE Sensitive     TRIMETH/SULFA <=20 SENSITIVE Sensitive     AMPICILLIN/SULBACTAM 4 SENSITIVE Sensitive     PIP/TAZO <=4 SENSITIVE Sensitive     Extended ESBL NEGATIVE Sensitive     * >=100,000 COLONIES/mL ESCHERICHIA COLI   Proteus mirabilis - MIC*    AMPICILLIN <=2 SENSITIVE Sensitive     CEFAZOLIN <=4 SENSITIVE Sensitive     CEFTRIAXONE <=1 SENSITIVE Sensitive     CIPROFLOXACIN <=0.25 SENSITIVE Sensitive     GENTAMICIN <=1 SENSITIVE Sensitive     IMIPENEM 2 SENSITIVE Sensitive     NITROFURANTOIN 128 RESISTANT Resistant     TRIMETH/SULFA <=20 SENSITIVE Sensitive     AMPICILLIN/SULBACTAM <=2 SENSITIVE Sensitive     PIP/TAZO <=4 SENSITIVE Sensitive     * 50,000 COLONIES/mL PROTEUS MIRABILIS     Radiology Studies: No results found.  Scheduled Meds: . feeding supplement (ENSURE ENLIVE)  237 mL Oral BID BM  . magic mouthwash  5 mL Oral QID   And  . lidocaine  5 mL Mouth/Throat QID   Continuous Infusions:    LOS: 12 days    Time spent: 25 minutes    Barton Dubois, MD [facility] Pager [phone]  If 7PM-7AM, please contact night-coverage [email or web address] Password [facility] 05/08/2017, 3:02 PM

## 2017-05-09 DIAGNOSIS — E870 Hyperosmolality and hypernatremia: Secondary | ICD-10-CM

## 2017-05-09 LAB — BASIC METABOLIC PANEL
ANION GAP: 8 (ref 5–15)
BUN: 33 mg/dL — ABNORMAL HIGH (ref 6–20)
CALCIUM: 7.9 mg/dL — AB (ref 8.9–10.3)
CO2: 30 mmol/L (ref 22–32)
Chloride: 109 mmol/L (ref 101–111)
Creatinine, Ser: 0.67 mg/dL (ref 0.44–1.00)
Glucose, Bld: 117 mg/dL — ABNORMAL HIGH (ref 65–99)
Potassium: 3.7 mmol/L (ref 3.5–5.1)
SODIUM: 147 mmol/L — AB (ref 135–145)

## 2017-05-09 NOTE — Progress Notes (Signed)
PROGRESS NOTE    Tina West  WCH:[id number] DOB: 31-Mar-1928 DOA: 04/22/2017 PCP: Claretta Fraise, MD   Brief Narrative:   This is an 84 year old female who was admitted from home on 2/10 following a fall.  She was found to have very poor living conditions at home and an APS report has been opened.  She has been diagnosed with failure to thrive with severe protein calorie malnutrition as well as some dehydration in the setting of Alzheimer's dementia.  She has multiple medical comorbidities including systolic heart failure with an EF of 15%.  She was initially noted to have some hyponatremia that has now switched to hypernatremia.  Additionally, she appears to have poor swallow capacity and speech saw today with recommendations for dysphagia 1 diet. She had NGT placed yesterday, but pulled this out. GI consulted to evaluate patient who does not recommend PEG tube at this time. She is noted to have a UTI for which she has been started on IV Rocephin with urine notable for E. coli and Proteus.  She continues to have trouble with hypernatremia for which D5W rate has been increased.  Assessment & Plan:   Principal Problem:   FTT (failure to thrive) in adult Active Problems:   Other specified hypothyroidism   Alzheimer's dementia   Hypothyroidism   Hypokalemia   Hypomagnesemia   Acute on chronic systolic CHF (congestive heart failure) (HCC)   Dehydration   Palliative care encounter   Pressure injury of skin   Counseling regarding advanced care planning and goals of care   Encounter for hospice care discussion   1. Failure to thrive/severe protein calorie malnutrition with concurrent dysphagia. Speech therapy advise for high risk of aspiration, but will allow for dysphagia 1 and nectar thick liquids when fully awake/alert. Not a candidate for PEG. Now pursuing comfort measures only.  2. Severe hypernatremia suspicious for DI.  Following GOC discussion will pursuit comfort care and only.  IVF's and artifical support/meds discontinued on 2/21 evening. No further labs.  3. AKI- overall improved. IVF's discontinued. Patient encourage to do her best with PO hydration and nutrition.  4. UTI- due to E Coli (resistant to ciprofloxacin) and Proteus (resistant to Nitrofurantoin); no fever. Will complete 7 days of rocephin on 2/20. Will observe off abx's. Currently afebrile.  5. Chronic systolic heart failure with EF 15-20%. Without alarming signs for hypervolemia. Follow daily weights. IVF's now discontinued.  6. Acute on chronic AMS in the setting of Alzheimer's dementia. In a patient with hypernatremia and UTI. Aricept discontinued. Will pursuit comfort care. 7. Hypothyroidism. Synthroid Now discontinued, in the setting of full comfort care. 8. History of atrial fibrillation. Was previously on Eliquis, now pursuing comfort care as per [facility] discussion by palliative care, will focus on comfort care and stop any meds not intended for comfort.  9. Pressure injuries of skin to bilateral buttocks, lumbar spine, and feet: in the setting of poor nutrition and lack of adequate mobility prior to admission. Will continue preventive measures and constant repositioning.   DVT prophylaxis: Lovenox  Code Status: Partial; ok with intubation Family Communication: None at bedside Disposition Plan: Patient has guardianship with the department of social services and is eligible to go to [facility] in [place] once she is stable for discharge. Due to further active decline in her condition palliative care consulted and has discuss with POA; decision has been to pursued comfort care and no heroic intervention.  IV fluids has been discontinue and her diet advanced to dysphagia  1.  Will follow clinical response.   Consultants:   Palliative Care  Procedures:   See below for x-ray reports   Antimicrobials:   Rocephin 2/14->2/20 (completed 7 days of antibiotics)   Subjective: AAOX1, in no major distress.  Very poor insight. Drinking some ensure, otherwise not having significant PO intake.   Objective: Vitals:   05/08/17 1326 05/08/17 2036 05/08/17 2100 05/09/17 0629  BP: (!) 148/75 118/84  134/84  Pulse: (!) 117 85  83  Resp: 18     Temp: 97.6 F (36.4 C) 97.7 F (36.5 C)  98 F (36.7 C)  TempSrc: Oral Oral  Oral  SpO2: 100% 100% 99% 100%  Weight:      Height:        Intake/Output Summary (Last 24 hours) at 05/09/2017 1505 Last data filed at 05/09/2017 0900 Gross per 24 hour  Intake 360 ml  Output 500 ml  Net -140 ml   Filed Weights   05/04/2017 1432 05/01/17 0649 05/01/17 1350  Weight: 53.5 kg (117 lb 15.1 oz) 55.1 kg (121 lb 7.6 oz) 59.8 kg (131 lb 13.4 oz)    Examination: General exam: Oriented X 1, confused and with poor insight. No CP, no SOB, no abd pain, no dysuria. Continue to be incontinent. Patient drinking ensures, otherwise not significant intake.  Rest of her physical exam unchanged from 05/08/17; see below for details: Respiratory system: Good air movement bilaterally, no using accessory muscles, normal respiratory effort.  Cardiovascular system: S1 and S2, positive systolic ejection murmur, no rubs, no gallops, no JVD. Gastrointestinal system: Soft, nontender, positive bowel sounds, no distention appreciated. Central nervous system: Cranial nerves grossly intact, patient oriented to person only.  Able to move 4 limbs spontaneously (with increase movement in her upper extremities bilaterally in comparison to her lower extremities) Extremities: Muscle strength 3 out of 5 bilaterally in her lower extremities secondary to poor effort and deconditioning; trace edema appreciated.  No cyanosis Skin: Multiple stage I pressure injuries appreciated with no signs of superimposed infection, affecting bilateral buttocks, lumbar spine area and both heels.  Data Reviewed: I have personally reviewed following labs and imaging studies  CBC: Recent Labs  Lab 05/03/17 0336  05/04/17 0720 05/05/17 0537 05/06/17 0621 05/06/17 1604  WBC 17.7* 18.8* 17.2* 16.1* 13.6*  HGB 10.4* 9.9* 10.0* 9.2* 9.4*  HCT 35.2* 34.4* 36.0 32.7* 32.6*  MCV 84.2 86.4 88.5 88.1 87.6  PLT 806* 927* 968* 972* 093*   Basic Metabolic Panel: Recent Labs  Lab 05/04/17 1349 05/05/17 0537 05/05/17 1641 05/06/17 0621 05/09/17 0712  NA 158* 156* 150* 155* 147*  K 4.0 3.6 3.9 3.8 3.7  CL 118* 114* 113* 116* 109  CO2 31 31 28 29 30   GLUCOSE 158* 122* 138* 110* 117*  BUN 51* 46* 41* 38* 33*  CREATININE 0.89 0.75 0.69 0.74 0.67  CALCIUM 8.2* 8.3* 7.9* 8.1* 7.9*   GFR: Estimated Creatinine Clearance: 47.7 mL/min (by C-G formula based on SCr of 0.67 mg/dL).   Liver Function Tests: Recent Labs  Lab 05/05/17 1641  AST 53*  ALT 70*  ALKPHOS 90  BILITOT 1.0  PROT 4.8*  ALBUMIN 2.1*     Recent Results (from the past 240 hour(s))  Culture, Urine     Status: Abnormal   Collection Time: 04/30/17 11:07 AM  Result Value Ref Range Status   Specimen Description   Final    URINE, CLEAN CATCH Performed at [facility], [address] [place], [place] [address]  Special Requests   Final    NONE Performed at [facility], [address] [place], [place] [address]    Culture (A)  Final    >=100,000 COLONIES/mL ESCHERICHIA COLI 50,000 COLONIES/mL PROTEUS MIRABILIS    Report Status 05/04/2017 FINAL  Final   Organism ID, Bacteria ESCHERICHIA COLI (A)  Final   Organism ID, Bacteria PROTEUS MIRABILIS (A)  Final      Susceptibility   Escherichia coli - MIC*    AMPICILLIN 8 SENSITIVE Sensitive     CEFAZOLIN <=4 SENSITIVE Sensitive     CEFTRIAXONE <=1 SENSITIVE Sensitive     CIPROFLOXACIN >=4 RESISTANT Resistant     GENTAMICIN <=1 SENSITIVE Sensitive     IMIPENEM <=0.25 SENSITIVE Sensitive     NITROFURANTOIN <=16 SENSITIVE Sensitive     TRIMETH/SULFA <=20 SENSITIVE Sensitive     AMPICILLIN/SULBACTAM 4 SENSITIVE Sensitive     PIP/TAZO <=4 SENSITIVE Sensitive     Extended ESBL  NEGATIVE Sensitive     * >=100,000 COLONIES/mL ESCHERICHIA COLI   Proteus mirabilis - MIC*    AMPICILLIN <=2 SENSITIVE Sensitive     CEFAZOLIN <=4 SENSITIVE Sensitive     CEFTRIAXONE <=1 SENSITIVE Sensitive     CIPROFLOXACIN <=0.25 SENSITIVE Sensitive     GENTAMICIN <=1 SENSITIVE Sensitive     IMIPENEM 2 SENSITIVE Sensitive     NITROFURANTOIN 128 RESISTANT Resistant     TRIMETH/SULFA <=20 SENSITIVE Sensitive     AMPICILLIN/SULBACTAM <=2 SENSITIVE Sensitive     PIP/TAZO <=4 SENSITIVE Sensitive     * 50,000 COLONIES/mL PROTEUS MIRABILIS     Radiology Studies: No results found.  Scheduled Meds: . feeding supplement (ENSURE ENLIVE)  237 mL Oral BID BM  . magic mouthwash  5 mL Oral QID   And  . lidocaine  5 mL Mouth/Throat QID   Continuous Infusions:    LOS: 13 days    Time spent: 25 minutes    Barton Dubois, MD [facility] Pager [phone]  If 7PM-7AM, please contact night-coverage [email or web address] Password [facility] 05/09/2017, 3:05 PM

## 2017-05-10 MED ORDER — RESOURCE THICKENUP CLEAR PO POWD
ORAL | Status: DC | PRN
  Filled 2017-05-10: qty 125

## 2017-05-10 NOTE — Progress Notes (Signed)
PROGRESS NOTE    Tina West  UVO:[id number] DOB: 01-07-1929 DOA: 04/19/2017 PCP: Claretta Fraise, MD   Brief Narrative:   This is an 84 year old female who was admitted from home on 2/10 following a fall.  She was found to have very poor living conditions at home and an APS report has been opened.  She has been diagnosed with failure to thrive with severe protein calorie malnutrition as well as some dehydration in the setting of Alzheimer's dementia.  She has multiple medical comorbidities including systolic heart failure with an EF of 15%.  She was initially noted to have some hyponatremia that has now switched to hypernatremia.  Additionally, she appears to have poor swallow capacity and speech saw today with recommendations for dysphagia 1 diet. She had NGT placed yesterday, but pulled this out. GI consulted to evaluate patient who does not recommend PEG tube at this time. She is noted to have a UTI for which she has been started on IV Rocephin with urine notable for E. coli and Proteus.  She continues to have trouble with hypernatremia for which D5W rate has been increased.  Assessment & Plan:   Principal Problem:   FTT (failure to thrive) in adult Active Problems:   Other specified hypothyroidism   Alzheimer's dementia   Hypothyroidism   Hypokalemia   Hypomagnesemia   Acute on chronic systolic CHF (congestive heart failure) (HCC)   Dehydration   Palliative care encounter   Pressure injury of skin   Counseling regarding advanced care planning and goals of care   Encounter for hospice care discussion   1. Failure to thrive/severe protein calorie malnutrition with concurrent dysphagia. Speech therapy advise for high risk of aspiration, but will allow for dysphagia 1 and nectar thick liquids when fully awake/alert. Not a candidate for PEG. Now pursuing comfort measures only.  2. Severe hypernatremia suspicious for DI.  Following GOC discussion will pursuit comfort care and only.  IVF's and artifical support/meds discontinued on 2/21 evening. No further labs.  3. AKI- overall improved. IVF's discontinued. Patient encourage to do her best with PO hydration and nutrition.  4. UTI- due to E Coli (resistant to ciprofloxacin) and Proteus (resistant to Nitrofurantoin); no fever. Will complete 7 days of rocephin on 2/20. Will observe off abx's. Currently afebrile.  5. Chronic systolic heart failure with EF 15-20%. Without alarming signs for hypervolemia. Follow daily weights. IVF's now discontinued.  6. Acute on chronic AMS in the setting of Alzheimer's dementia. In a patient with hypernatremia and UTI. Aricept discontinued. Will pursuit comfort care. 7. Hypothyroidism. Synthroid Now discontinued, in the setting of full comfort care. 8. History of atrial fibrillation. Was previously on Eliquis, now pursuing comfort care as per [facility] discussion by palliative care, will focus on comfort care and stop any meds not intended for comfort.  9. Pressure injuries of skin to bilateral buttocks, lumbar spine, and feet: in the setting of poor nutrition and lack of adequate mobility prior to admission. Will continue preventive measures and constant repositioning.   DVT prophylaxis: Lovenox  Code Status: Partial; ok with intubation Family Communication: None at bedside Disposition Plan: Patient has guardianship with the department of social services and is eligible to go to [facility] in [place] once she is stable for discharge. Due to further active decline in her condition palliative care consulted and has discuss with POA; decision has been to pursued comfort care and no heroic intervention.  IV fluids has been discontinue and her diet advanced to dysphagia  1.  Will follow clinical response.   Consultants:   Palliative Care  Procedures:   See below for x-ray reports   Antimicrobials:   Rocephin 2/14->2/20 (completed 7 days of antibiotics)  Subjective: Oriented x1, no CP, no nausea, no  vomiting and no SOB.   Objective: Vitals:   05/09/17 1500 05/09/17 2035 05/09/17 2155 05/10/17 0552  BP: 125/79  136/71 118/83  Pulse: 84  (!) 110   Resp:      Temp: 98.1 F (36.7 C)  98.9 F (37.2 C) 98 F (36.7 C)  TempSrc: Oral  Oral Oral  SpO2: 100% 98% 100% 100%  Weight:      Height:        Intake/Output Summary (Last 24 hours) at 05/10/2017 1602 Last data filed at 05/10/2017 1249 Gross per 24 hour  Intake 780 ml  Output 800 ml  Net -20 ml   Filed Weights   04/29/2017 1432 05/01/17 0649 05/01/17 1350  Weight: 53.5 kg (117 lb 15.1 oz) 55.1 kg (121 lb 7.6 oz) 59.8 kg (131 lb 13.4 oz)    Examination: General exam: oriented X1, no CP, no SOB. Denies nausea and vomiting. Afebrile.  Rest of her physical exam unchanged from 05/09/17; see below for details: Respiratory system: Good air movement bilaterally, no using accessory muscles, normal respiratory effort.  Cardiovascular system: S1 and S2, positive systolic ejection murmur, no rubs, no gallops, no JVD. Gastrointestinal system: Soft, nontender, positive bowel sounds, no distention appreciated. Central nervous system: Cranial nerves grossly intact, patient oriented to person only.  Able to move 4 limbs spontaneously (with increase movement in her upper extremities bilaterally in comparison to her lower extremities) Extremities: Muscle strength 3 out of 5 bilaterally in her lower extremities secondary to poor effort and deconditioning; trace edema appreciated.  No cyanosis Skin: Multiple stage I pressure injuries appreciated with no signs of superimposed infection, affecting bilateral buttocks, lumbar spine area and both heels.  Data Reviewed: I have personally reviewed following labs and imaging studies  CBC: Recent Labs  Lab 05/04/17 0720 05/05/17 0537 05/06/17 0621 05/06/17 1604  WBC 18.8* 17.2* 16.1* 13.6*  HGB 9.9* 10.0* 9.2* 9.4*  HCT 34.4* 36.0 32.7* 32.6*  MCV 86.4 88.5 88.1 87.6  PLT 927* 968* 972* 927*    Basic Metabolic Panel: Recent Labs  Lab 05/04/17 1349 05/05/17 0537 05/05/17 1641 05/06/17 0621 05/09/17 0712  NA 158* 156* 150* 155* 147*  K 4.0 3.6 3.9 3.8 3.7  CL 118* 114* 113* 116* 109  CO2 31 31 28 29 30   GLUCOSE 158* 122* 138* 110* 117*  BUN 51* 46* 41* 38* 33*  CREATININE 0.89 0.75 0.69 0.74 0.67  CALCIUM 8.2* 8.3* 7.9* 8.1* 7.9*   GFR: Estimated Creatinine Clearance: 47.7 mL/min (by C-G formula based on SCr of 0.67 mg/dL).   Liver Function Tests: Recent Labs  Lab 05/05/17 1641  AST 53*  ALT 70*  ALKPHOS 90  BILITOT 1.0  PROT 4.8*  ALBUMIN 2.1*   Radiology Studies: No results found.  Scheduled Meds: . feeding supplement (ENSURE ENLIVE)  237 mL Oral BID BM  . magic mouthwash  5 mL Oral QID   And  . lidocaine  5 mL Mouth/Throat QID   Continuous Infusions:    LOS: 14 days    Time spent: 25 minutes    Barton Dubois, MD [facility] Pager [phone]  If 7PM-7AM, please contact night-coverage [email or web address] Password [facility] 05/10/2017, 4:02 PM

## 2017-05-11 ENCOUNTER — Encounter: Payer: Self-pay | Admitting: *Deleted

## 2017-05-11 ENCOUNTER — Other Ambulatory Visit: Payer: Self-pay | Admitting: Pharmacist

## 2017-05-11 ENCOUNTER — Other Ambulatory Visit: Payer: Self-pay | Admitting: *Deleted

## 2017-05-11 NOTE — Progress Notes (Addendum)
Daily Progress Note   Patient Name: Tina West       Date: 05/11/2017 DOB: Dec 22, 1928  Age: 84 y.o. MRN#: [id number] Attending Physician: Barton Dubois, MD Primary Care Physician: Claretta Fraise, MD Admit Date: 04/17/2017  Reason for Consultation/Follow-up: Establishing goals of care and Psychosocial/spiritual support  Subjective: Mrs. Baratta is resting quietly in bed.  She greets me making and keeping eye contact.  She is calm and cooperative, pleasant.  There is no family at bedside at this time.  Mrs. Slone is able to make full sentences, make her basic needs known.  She is able to eat ice cream without overt signs and symptoms of choking/aspiration.  She is able to hold the ice cream cup in her hand and feed herself.  Call to grand daughter-in-law, Charrise Lardner.  We talked about her grandmother's improvements.  Abigail Butts is agreeable for Mrs. Salemi to go to [facility].  She continues to have concerns about "what is next".  Conference with in-house Education officer, museum.  We talked about canceling the residential hospice referral, placement for likely [facility].    Conference with DSS social worker, [place].  She states that DSS temporary guardianship is also in agreement for [facility], stumbling block his prior source.  At this point, no one has access to Mrs. Cichy's funds.  Social worker Kayleen Memos states full guardianship hearing is scheduled for 3/5.  Conference with hospitalist, Dr. Dyann Kief on next rounds.   Length of Stay: 15  Current Medications: Scheduled Meds:  . feeding supplement (ENSURE ENLIVE)  237 mL Oral BID BM  . magic mouthwash  5 mL Oral QID   And  . lidocaine  5 mL Mouth/Throat QID    Continuous Infusions:   PRN  Meds: acetaminophen **OR** acetaminophen, bisacodyl, fentaNYL (SUBLIMAZE) injection, hydrALAZINE, LORazepam, ondansetron **OR** ondansetron (ZOFRAN) IV, RESOURCE THICKENUP CLEAR  Physical Exam  Constitutional: No distress.  Makes and keeps eye contact, calm and cooperative, known dementia.  HENT:  Head: Atraumatic.  Cardiovascular: Normal rate.  Pulmonary/Chest: Effort normal. No respiratory distress.  Abdominal: Soft. She exhibits no distension.  Musculoskeletal: She exhibits no edema.  Frail and thin  Neurological: She is alert.  Known dementia  Skin: Skin is warm and dry.  Nursing note and vitals reviewed.  Vital Signs: BP 123/75 (BP Location: Right Arm)   Pulse (!) 110   Temp 98 F (36.7 C) (Oral)   Resp 14   Ht 5\' 9"  (1.753 m)   Wt 59.8 kg (131 lb 13.4 oz)   SpO2 100%   BMI 19.47 kg/m  SpO2: SpO2: 100 % O2 Device: O2 Device: Nasal Cannula O2 Flow Rate: O2 Flow Rate (L/min): 2 L/min  Intake/output summary:   Intake/Output Summary (Last 24 hours) at 05/11/2017 1151 Last data filed at 05/11/2017 0272 Gross per 24 hour  Intake 1020 ml  Output 200 ml  Net 820 ml   LBM: Last BM Date: 05/10/17 Baseline Weight: Weight: 68 kg (150 lb) Most recent weight: Weight: 59.8 kg (131 lb 13.4 oz)       Palliative Assessment/Data:    Flowsheet Rows     Most Recent Value  Intake Tab  Referral Department  Hospitalist  Unit at Time of Referral  Med/Surg Unit  Palliative Care Primary Diagnosis  Cardiac  Date Notified  04/27/17  Palliative Care Type  New Palliative care  Reason for referral  Clarify Goals of Care  Date of Admission  05/08/2017  Date first seen by Palliative Care  04/27/17  # of days Palliative referral response time  0 Day(s)  # of days IP prior to Palliative referral  1  Clinical Assessment  Psychosocial & Spiritual Assessment  Palliative Care Outcomes      Patient Active Problem List   Diagnosis Date Noted  . Counseling regarding advanced  care planning and goals of care   . Encounter for hospice care discussion   . Pressure injury of skin 04/29/2017  . Palliative care encounter   . FTT (failure to thrive) in adult 04/24/2017  . Hypokalemia 04/22/2017  . Hypomagnesemia 05/14/2017  . Acute on chronic systolic CHF (congestive heart failure) ([place]) 04/29/2017  . Dehydration 04/17/2017  . PAD (peripheral artery disease) ([place]) 03/16/2017  . Acute systolic CHF (congestive heart failure) ([place]) 03/14/2017  . Demand ischemia ([place]) 03/13/2017  . Acute on chronic diastolic (congestive) heart failure ([place]) 03/13/2017  . Thrombocytosis ([place]) 03/13/2017  . Protein-calorie malnutrition, severe 03/13/2017  . A-fib ([place]) 03/12/2017  . Left leg cellulitis 03/12/2017  . Hypothyroidism 03/12/2017  . Other specified hypothyroidism 01/12/2015  . Alzheimer's dementia 01/12/2015  . CARDIOMYOPATHY, ALCOHOLIC 53/66/4403  . Essential hypertension 04/05/2009  . PERSONAL HISTORY OF SUDDEN CARDIAC ARREST 04/05/2009    Palliative Care Assessment & Plan   Patient Profile: 84 y.o.femalewith past medical history of dementia, mixed HF with an EF of 15-20%, PAD, obesity, and anxietywho was admitted on 2/10/2019after a fall at home. She was reported to be found on a mattress on the floor incontinent of urine and feces. Admission work up showed a WBC of 25 and BNP of 2443. Clinically she appeared volume depleted.  Assessment: Failure to thrive,dementia, UTI;being treated with IV antibiotics, lost IV site but was able to access midline IV. Continues to be unable to take adequate nutrition, dysphasia 1 diet ordered, but patient continues to pocket food. GI does not recommend PEG tube.  Recommendations/Plan:  At this point, Mrs. Rikard does not qualify for residential hospice.   Family is agreeable to placement at [facility].  Conference with DSS social worker, they are also agreeable to placement at [facility].  There is  concern about payer source.  In-house social work Dentist with [employer] work.  Goals of Care and Additional Recommendations:  Limitations on  Scope of Treatment: Full Comfort Care  Code Status:    Code Status Orders  (From admission, onward)        Start     Ordered   05/07/17 1354  Do not attempt resuscitation (DNR)  Continuous    Question Answer Comment  In the event of cardiac or respiratory ARREST Do not call a "code blue"   In the event of cardiac or respiratory ARREST Do not perform Intubation, CPR, defibrillation or ACLS   In the event of cardiac or respiratory ARREST Use medication by any route, position, wound care, and other measures to relive pain and suffering. May use oxygen, suction and manual treatment of airway obstruction as needed for comfort.      05/07/17 1353    Code Status History    Date Active Date Inactive Code Status Order ID Comments User Context   04/28/2017 13:21 05/07/2017 13:53 Partial Code [id number]  Lorenso Quarry Inpatient   04/28/2017 13:15 04/28/2017 13:21 Partial Code [id number]  Lorenso Quarry Inpatient   04/28/2017 18:09 04/28/2017 13:15 Full Code [id number]  Erline Hau, MD Inpatient   03/12/2017 18:14 03/27/2017 17:21 Full Code [id number]  Debbe Odea, MD ED    Advance Directive Documentation     Most Recent Value  Type of Advance Directive  Healthcare Power of Attorney  Pre-existing out of facility DNR order (yellow form or pink MOST form)  No data  "MOST" Form in Place?  No data       Prognosis:   < 3 months, or less would not be surprising based on frailty, albumin 2.1, functional status, family's desire to focus on comfort and dignity at end of life.  Discharge Planning:  To be determined, likely [place] was discussed with nursing staff, in house social worker, [employer], Dr. Dyann Kief.  Thank you for allowing the Palliative Medicine Team to assist in the  care of this patient.   Time In: 0940 Time Out: 1030 Total Time 50 miutes Prolonged Time Billed  yes        Greater than 50%  of this time was spent counseling and coordinating care related to the above assessment and plan.  Drue Novel, NP  Please contact Palliative Medicine Team phone at [phone] for questions and concerns.

## 2017-05-11 NOTE — Progress Notes (Signed)
PROGRESS NOTE    Tina West  MWN:[id number] DOB: Mar 24, 1928 DOA: 04/23/2017 PCP: Claretta Fraise, MD   Brief Narrative:   This is an 84 year old female who was admitted from home on 2/10 following a fall.  She was found to have very poor living conditions at home and an APS report has been opened.  She has been diagnosed with failure to thrive with severe protein calorie malnutrition as well as some dehydration in the setting of Alzheimer's dementia.  She has multiple medical comorbidities including systolic heart failure with an EF of 15%.  She was initially noted to have some hyponatremia that has now switched to hypernatremia.  Additionally, she appears to have poor swallow capacity and speech saw today with recommendations for dysphagia 1 diet. She had NGT placed yesterday, but pulled this out. GI consulted to evaluate patient who does not recommend PEG tube at this time. She is noted to have a UTI for which she has been started on IV Rocephin with urine notable for E. coli and Proteus.  She continues to have trouble with hypernatremia for which D5W rate has been increased.  Assessment & Plan:   Principal Problem:   FTT (failure to thrive) in adult Active Problems:   Other specified hypothyroidism   Alzheimer's dementia   Hypothyroidism   Hypokalemia   Hypomagnesemia   Acute on chronic systolic CHF (congestive heart failure) (HCC)   Dehydration   Palliative care encounter   Pressure injury of skin   Counseling regarding advanced care planning and goals of care   Encounter for hospice care discussion   1. Failure to thrive/severe protein calorie malnutrition with concurrent dysphagia. Speech therapy advise for high risk of aspiration, but will allow for dysphagia 1 and nectar thick liquids when fully awake/alert. Not a candidate for PEG. Now pursuing comfort measures only.  2. Severe hypernatremia suspicious for DI.  Following GOC discussion will pursuit comfort care and only.  IVF's and artifical support/meds discontinued on 2/21 evening. No further labs.  3. AKI- overall improved. IVF's discontinued. Patient encourage to do her best with PO hydration and nutrition.  4. UTI- due to E Coli (resistant to ciprofloxacin) and Proteus (resistant to Nitrofurantoin); no fever. Will complete 7 days of rocephin on 2/20. Will observe off abx's. Currently afebrile.  5. Chronic systolic heart failure with EF 15-20%. Without alarming signs for hypervolemia. Follow daily weights. IVF's now discontinued.  6. Acute on chronic AMS in the setting of Alzheimer's dementia. In a patient with hypernatremia and UTI. Aricept discontinued. Will pursuit comfort care. 7. Hypothyroidism. Synthroid Now discontinued, in the setting of full comfort care. 8. History of atrial fibrillation. Was previously on Eliquis, now pursuing comfort care as per [place] discussion by palliative care, will focus on comfort care and stop any meds not intended for comfort.  9. Pressure injuries of skin to bilateral buttocks, lumbar spine, and feet: in the setting of poor nutrition and lack of adequate mobility prior to admission. Will continue preventive measures and constant repositioning.   DVT prophylaxis: Lovenox  Code Status: Partial; ok with intubation Family Communication: None at bedside Disposition Plan: Patient has guardianship with the department of social services and is eligible to go to [facility] in [place] once she is stable for discharge. Due to further active decline in her condition palliative care consulted and has discuss with POA; decision has been to pursued comfort care and no heroic intervention.  IV fluids has been discontinue and her diet advanced to dysphagia  1.  Will follow clinical response.   Consultants:   Palliative Care  Procedures:   See below for x-ray reports   Antimicrobials:   Rocephin 2/14->2/20 (completed 7 days of antibiotics)  Subjective: No fever, no CP, weak and  deconditioned. reports no CP or SOB.   Objective: Vitals:   05/10/17 2031 05/10/17 2132 05/11/17 0654 05/11/17 1427  BP:  137/77 123/75 116/85  Pulse:  65 (!) 110 83  Resp:    16  Temp:  98 F (36.7 C)  98.7 F (37.1 C)  TempSrc:  Oral  Oral  SpO2: 99% 100% 100% 99%  Weight:      Height:        Intake/Output Summary (Last 24 hours) at 05/11/2017 1634 Last data filed at 05/11/2017 1354 Gross per 24 hour  Intake 840 ml  Output -  Net 840 ml   Filed Weights   05/04/2017 1432 05/01/17 0649 05/01/17 1350  Weight: 53.5 kg (117 lb 15.1 oz) 55.1 kg (121 lb 7.6 oz) 59.8 kg (131 lb 13.4 oz)    Examination: General exam: oriented X1, in no major distress. Mainly drinking some ensure.  Rest of her physical exam unchanged from 05/10/17; see below for details: Respiratory system: Good air movement bilaterally, no using accessory muscles, normal respiratory effort.  Cardiovascular system: S1 and S2, positive systolic ejection murmur, no rubs, no gallops, no JVD. Gastrointestinal system: Soft, nontender, positive bowel sounds, no distention appreciated. Central nervous system: Cranial nerves grossly intact, patient oriented to person only.  Able to move 4 limbs spontaneously (with increase movement in her upper extremities bilaterally in comparison to her lower extremities) Extremities: Muscle strength 3 out of 5 bilaterally in her lower extremities secondary to poor effort and deconditioning; trace edema appreciated.  No cyanosis Skin: Multiple stage I pressure injuries appreciated with no signs of superimposed infection, affecting bilateral buttocks, lumbar spine area and both heels.  Data Reviewed: I have personally reviewed following labs and imaging studies  CBC: Recent Labs  Lab 05/05/17 0537 05/06/17 0621 05/06/17 1604  WBC 17.2* 16.1* 13.6*  HGB 10.0* 9.2* 9.4*  HCT 36.0 32.7* 32.6*  MCV 88.5 88.1 87.6  PLT 968* 972* 616*   Basic Metabolic Panel: Recent Labs  Lab  05/05/17 0537 05/05/17 1641 05/06/17 0621 05/09/17 0712  NA 156* 150* 155* 147*  K 3.6 3.9 3.8 3.7  CL 114* 113* 116* 109  CO2 31 28 29 30   GLUCOSE 122* 138* 110* 117*  BUN 46* 41* 38* 33*  CREATININE 0.75 0.69 0.74 0.67  CALCIUM 8.3* 7.9* 8.1* 7.9*   GFR: Estimated Creatinine Clearance: 47.7 mL/min (by C-G formula based on SCr of 0.67 mg/dL).   Liver Function Tests: Recent Labs  Lab 05/05/17 1641  AST 53*  ALT 70*  ALKPHOS 90  BILITOT 1.0  PROT 4.8*  ALBUMIN 2.1*   Radiology Studies: No results found.  Scheduled Meds: . feeding supplement (ENSURE ENLIVE)  237 mL Oral BID BM  . magic mouthwash  5 mL Oral QID   And  . lidocaine  5 mL Mouth/Throat QID   Continuous Infusions:    LOS: 15 days    Time spent: 25 minutes    Barton Dubois, MD [facility] Pager [phone]  If 7PM-7AM, please contact night-coverage [email or web address] Password [facility] 05/11/2017, 4:34 PM

## 2017-05-11 NOTE — Patient Outreach (Signed)
Pt hospitalized 04/22/2017 and continues to be hospitalized and plan is for long term skilled nursing facility, guardianship has been obtained by Social services. Pt has been hospitalized greater than 10 days,  RN CM sent in basket to [facility] pharmacist and [facility] CSW, hospital liason informing of case closure.  RN CM faxed primary MD Dr. Cletus Gash stacks case closure letter.  PLAN Close case  Jacqlyn Larsen [facility], [place] Coordinator [phone]

## 2017-05-11 NOTE — Patient Outreach (Signed)
[facility]) Care Management  05/11/2017  Jalisha Enneking 1929/02/01 [id number]  Per [facility] RN, discharge plans for patient will be long term SNF.  [facility] pharmacy will close case at this time.  I am happy to assist in the future as needed.   Ralene Bathe, PharmD, [place] [phone]

## 2017-05-11 NOTE — Care Management Important Message (Signed)
Important Message  Patient Details  Name: Tina West MRN: [id number] Date of Birth: [birth year]   Medicare Important Message Given:  Yes    Sherald Barge, RN 05/11/2017, 2:07 PM

## 2017-05-12 MED ORDER — METOPROLOL TARTRATE 5 MG/5ML IV SOLN
5.0000 mg | Freq: Once | INTRAVENOUS | Status: AC
  Administered 2017-05-12: 5 mg via INTRAVENOUS
  Filled 2017-05-12: qty 5

## 2017-05-12 NOTE — Progress Notes (Signed)
Daily Progress Note   Patient Name: Tina West       Date: 05/12/2017 DOB: 1928-11-21  Age: 84 y.o. MRN#: [id number] Attending Physician: Tina Dubois, MD Primary Care Physician: Tina Fraise, MD Admit Date: 05/03/2017  Reason for Consultation/Follow-up: Establishing goals of care and Psychosocial/spiritual support  Subjective: Conference with DSS social worker this morning.  Tina West has had a major decline overnight, today she will likely qualify for residential hospice.   She opens her eyes to voice, able to make her basic needs known, unable to raise her arms.  Unable to make full sentences.  Takes only sips of food or liquid.  Conference with in-house social worker related to residential hospice referral.  Conference with the guardian ad litem, Tina West.   Length of Stay: 16  Current Medications: Scheduled Meds:  . feeding supplement (ENSURE ENLIVE)  237 mL Oral BID BM  . magic mouthwash  5 mL Oral QID   And  . lidocaine  5 mL Mouth/Throat QID    Continuous Infusions:   PRN Meds: acetaminophen **OR** acetaminophen, bisacodyl, hydrALAZINE, LORazepam, ondansetron **OR** ondansetron (ZOFRAN) IV, RESOURCE THICKENUP CLEAR  Physical Exam  Constitutional: She appears distressed.  Appears weak and frail, acutely/chronically ill  HENT:  Head: Atraumatic.  Temporal wasting  Pulmonary/Chest: Effort normal. No respiratory distress.  Abdominal: Soft. She exhibits distension.  Musculoskeletal: She exhibits no edema.  Neurological:  Opens eyes to voice, able to make basic needs known.  Skin: Skin is warm and dry.  Wounds as noted by nursing  Nursing note and vitals reviewed.           Vital Signs: BP 114/67 (BP Location: Right Arm)   Pulse (!) 137   Temp  97.6 F (36.4 C) (Oral)   Resp 16   Ht 5\' 9"  (1.753 m)   Wt 59.8 kg (131 lb 13.4 oz)   SpO2 98%   BMI 19.47 kg/m  SpO2: SpO2: 98 % O2 Device: O2 Device: Not Delivered O2 Flow Rate: O2 Flow Rate (L/min): 2 L/min  Intake/output summary:   Intake/Output Summary (Last 24 hours) at 05/12/2017 1106 Last data filed at 05/12/2017 4665 Gross per 24 hour  Intake 385 ml  Output 1950 ml  Net -1565 ml   LBM: Last BM Date: 05/10/17 Baseline Weight: Weight: 68 kg (150  lb) Most recent weight: Weight: 59.8 kg (131 lb 13.4 oz)       Palliative Assessment/Data:    Flowsheet Rows     Most Recent Value  Intake Tab  Referral Department  Hospitalist  Unit at Time of Referral  Med/Surg Unit  Palliative Care Primary Diagnosis  Cardiac  Date Notified  04/27/17  Palliative Care Type  New Palliative care  Reason for referral  Clarify Goals of Care  Date of Admission  04/24/2017  Date first seen by Palliative Care  04/27/17  # of days Palliative referral response time  0 Day(s)  # of days IP prior to Palliative referral  1  Clinical Assessment  Psychosocial & Spiritual Assessment  Palliative Care Outcomes      Patient Active Problem List   Diagnosis Date Noted  . Counseling regarding advanced care planning and goals of care   . Encounter for hospice care discussion   . Pressure injury of skin 04/29/2017  . Palliative care encounter   . FTT (failure to thrive) in adult 05/06/2017  . Hypokalemia 04/22/2017  . Hypomagnesemia 05/14/2017  . Acute on chronic systolic CHF (congestive heart failure) ([place]) 04/23/2017  . Dehydration 05/03/2017  . PAD (peripheral artery disease) ([place]) 03/16/2017  . Acute systolic CHF (congestive heart failure) ([place]) 03/14/2017  . Demand ischemia ([place]) 03/13/2017  . Acute on chronic diastolic (congestive) heart failure ([place]) 03/13/2017  . Thrombocytosis ([place]) 03/13/2017  . Protein-calorie malnutrition, severe 03/13/2017  . A-fib ([place]) 03/12/2017  . Left leg  cellulitis 03/12/2017  . Hypothyroidism 03/12/2017  . Other specified hypothyroidism 01/12/2015  . Alzheimer's dementia 01/12/2015  . CARDIOMYOPATHY, ALCOHOLIC 95/28/4132  . Essential hypertension 04/05/2009  . PERSONAL HISTORY OF SUDDEN CARDIAC ARREST 04/05/2009    Palliative Care Assessment & Plan   Patient Profile: 84 y.o.femalewith past medical history of dementia, mixed HF with an EF of 15-20%, PAD, obesity, and anxietywho was admitted on 2/10/2019after a fall at home. She was reported to be found on a mattress on the floor incontinent of urine and feces. Admission work up showed a WBC of 25 and BNP of 2443. Clinically she appeared volume depleted.  Assessment: Failure to thrive,dementia, UTI;being treated with IV antibiotics, lost IV site but was able to access midline IV. Continues to be unable to take adequate nutrition, dysphasia 1 diet ordered, but patient continues to pocket food. GI does not recommend PEG tube.  Recommendations/Plan:  Tina West has had a major decline overnight, today she will likely qualify for residential hospice.   Conference with DSS social worker, they are also agreeable to placement at [facility], but will defer prefer residential hospice.  There is concern about payer source.  In-house social work Dentist with [employer] work  Goals of Care and Additional Recommendations:  Limitations on Scope of Treatment: Full Comfort Care  Code Status:    Code Status Orders  (From admission, onward)        Start     Ordered   05/07/17 1354  Do not attempt resuscitation (DNR)  Continuous    Question Answer Comment  In the event of cardiac or respiratory ARREST Do not call a "code blue"   In the event of cardiac or respiratory ARREST Do not perform Intubation, CPR, defibrillation or ACLS   In the event of cardiac or respiratory ARREST Use medication by any route, position, wound care, and other measures to relive pain and  suffering. May use oxygen, suction and manual treatment of  airway obstruction as needed for comfort.      05/07/17 1353    Code Status History    Date Active Date Inactive Code Status Order ID Comments User Context   04/28/2017 13:21 05/07/2017 13:53 Partial Code [id number]  Lorenso Quarry Inpatient   04/28/2017 13:15 04/28/2017 13:21 Partial Code [id number]  Lorenso Quarry Inpatient   04/28/2017 18:09 04/28/2017 13:15 Full Code [id number]  Erline Hau, MD Inpatient   03/12/2017 18:14 03/27/2017 17:21 Full Code [id number]  Debbe Odea, MD ED    Advance Directive Documentation     Most Recent Value  Type of Advance Directive  Healthcare Power of Attorney  Pre-existing out of facility DNR order (yellow form or pink MOST form)  No data  "MOST" Form in Place?  No data       Prognosis:   < 2 weeks  Discharge Planning:  Requesting residential hospice, would likely qualify today.  Care plan was discussed with nursing staff, in-house social worker, ad litem guardian, DSS social worker, Dr. Dyann Kief on next rounds  Thank you for allowing the Palliative Medicine Team to assist in the care of this patient.   Time In: 1020 Time Out: 1120 Total Time 60 minutes Prolonged Time Billed  yes       Greater than 50%  of this time was spent counseling and coordinating care related to the above assessment and plan.  Drue Novel, NP  Please contact Palliative Medicine Team phone at [phone] for questions and concerns.

## 2017-05-12 NOTE — Care Management Note (Signed)
Case Management Note  Patient Details  Name: Tina West MRN: [id number] Date of Birth: [birth year]  If discussed at Long Length of Stay Meetings, dates discussed:  05/12/2017  Additional Comments:  Sherald Barge, RN 05/12/2017, 2:25 PM

## 2017-05-12 NOTE — Progress Notes (Signed)
Night shift floor coverage note.  The staff has reported that the patient is currently tachycardic with her pulse in the 160s-170s.  Advised to give as needed lorazepam.  A single dose of metoprolol 5 mg IVP ordered to reduce anxiety of tachycardia.  Tennis Must, MD

## 2017-05-12 NOTE — Progress Notes (Signed)
PROGRESS NOTE    Marriah West  BDZ:[id number] DOB: [birth year] DOA: 04/29/2017 PCP: Claretta Fraise, MD   Brief Narrative:   This is an 84 year old female who was admitted from home on 2/10 following a fall.  She was found to have very poor living conditions at home and an APS report has been opened.  She has been diagnosed with failure to thrive with severe protein calorie malnutrition as well as some dehydration in the setting of Alzheimer's dementia.  She has multiple medical comorbidities including systolic heart failure with an EF of 15%.  She was initially noted to have some hyponatremia that has now switched to hypernatremia.  Additionally, she appears to have poor swallow capacity and speech saw today with recommendations for dysphagia 1 diet. She had NGT placed yesterday, but pulled this out. GI consulted to evaluate patient who does not recommend PEG tube at this time. She is noted to have a UTI for which she has been started on IV Rocephin with urine notable for E. coli and Proteus.   Despite IVF's and initial aggressive support, patient continue declining and has now been transitioned to comfort care and hospice.   Assessment & Plan:   Principal Problem:   FTT (failure to thrive) in adult Active Problems:   Other specified hypothyroidism   Alzheimer's dementia   Hypothyroidism   Hypokalemia   Hypomagnesemia   Acute on chronic systolic CHF (congestive heart failure) (HCC)   Dehydration   Palliative care encounter   Pressure injury of skin   Counseling regarding advanced care planning and goals of care   Encounter for hospice care discussion   1. Failure to thrive/severe protein calorie malnutrition with concurrent dysphagia. Speech therapy advise for high risk of aspiration, but will allow for dysphagia 1 and nectar thick liquids when fully awake/alert. Not a candidate for PEG. Now pursuing comfort measures only. Will keep her comfortable and follow evaluation by hospice  group. 2. Severe hypernatremia suspicious for DI.  Following GOC discussion will pursuit comfort care and only. IVF's and artifical support/meds discontinued on 2/21 evening. No further labs.  3. AKI- overall improved. IVF's discontinued. Patient encourage to do her best with PO hydration and nutrition.  4. UTI- due to E Coli (resistant to ciprofloxacin) and Proteus (resistant to Nitrofurantoin); no fever. Will complete 7 days of rocephin on 2/20. Will observe off abx's. Currently afebrile.  5. Chronic systolic heart failure with EF 15-20%. Without alarming signs for hypervolemia. Follow daily weights. IVF's now discontinued.  6. Acute on chronic AMS in the setting of Alzheimer's dementia. In a patient with hypernatremia and UTI. Aricept discontinued. Will pursuit comfort care. 7. Hypothyroidism. Synthroid Now discontinued, in the setting of full comfort care. 8. History of atrial fibrillation. Was previously on Eliquis, now pursuing comfort care as per [place] discussion by palliative care, will focus on comfort care and stop any meds not intended for comfort.  9. Pressure injuries of skin to bilateral buttocks, lumbar spine, and feet: in the setting of poor nutrition and lack of adequate mobility prior to admission. Will continue preventive measures and constant repositioning.   DVT prophylaxis: none  Code Status: Patient is ull DNR; just comfort care. Family Communication: None at bedside Disposition Plan: Patient has guardianship with the department of social services and was eligible to go to [facility] in [place] once she is stable for discharge. Due to further active decline in her condition, palliative care consulted and discussion with POA was done; decision has been  made to pursued comfort care and no heroic intervention.  IV fluids were discontinued and patient allowed to have dysphagia 1 diet.  Patient initially with slow but sure decline; no lethargic/obtunded and not eating or drinking much.  Appears appropriate for residential hospice.   Consultants:   Palliative Care  Procedures:   See below for x-ray reports   Antimicrobials:   Rocephin 2/14->2/20 (completed 7 days of antibiotics)  Subjective: No fever, no nausea.  Patient is lethargic and just opening eyes when aroused. Not following commands or performing much interaction. (significant decline in comparison to exam on 2/25)  Objective: Vitals:   05/11/17 2100 05/12/17 0500 05/12/17 0613 05/12/17 1334  BP: 114/71 114/67  (!) 111/91  Pulse: 90 (!) 169 (!) 137 83  Resp: 16 16  18   Temp: 98.3 F (36.8 C) 97.6 F (36.4 C)  97.7 F (36.5 C)  TempSrc: Oral Oral  Axillary  SpO2: 100% 98%  100%  Weight:      Height:        Intake/Output Summary (Last 24 hours) at 05/12/2017 1705 Last data filed at 05/12/2017 3009 Gross per 24 hour  Intake 145 ml  Output 1950 ml  Net -1805 ml   Filed Weights   04/27/2017 1432 05/01/17 0649 05/01/17 1350  Weight: 53.5 kg (117 lb 15.1 oz) 55.1 kg (121 lb 7.6 oz) 59.8 kg (131 lb 13.4 oz)    Examination: General exam: Lethargic, able to open her eyes on request, no following commands and no moving limbs. Mild distress appreciated.  Respiratory system: Scattered rhonchi, no wheezing, no using accessory muscles, no frank crackles.   Cardiovascular system: Tachycardic, no rubs, no gallops, positive systolic ejection murmur. Gastrointestinal system: Soft, no guarding, positive bowel sounds, mild distention appreciated on exam.. Central nervous system: Patient no following commands, however no movement in the limbs, seeping with assistance and does vaguely opening eyes when aroused.  Unable to properly assess further neurologic exam due to current mentation. Extremities: no moving limbs currently. Skin: skin exam unchanged from 2/25, no ras, no petechiae. Multiple stage I pressure injuries appreciated with no signs of superimposed infection, affecting bilateral buttocks, lumbar spine  area and both heels.  Data Reviewed: I have personally reviewed following labs and imaging studies  CBC: Recent Labs  Lab 05/06/17 0621 05/06/17 1604  WBC 16.1* 13.6*  HGB 9.2* 9.4*  HCT 32.7* 32.6*  MCV 88.1 87.6  PLT 972* 233*   Basic Metabolic Panel: Recent Labs  Lab 05/06/17 0621 05/09/17 0712  NA 155* 147*  K 3.8 3.7  CL 116* 109  CO2 29 30  GLUCOSE 110* 117*  BUN 38* 33*  CREATININE 0.74 0.67  CALCIUM 8.1* 7.9*   GFR: Estimated Creatinine Clearance: 47.7 mL/min (by C-G formula based on SCr of 0.67 mg/dL).   Radiology Studies: No results found.  Scheduled Meds: . feeding supplement (ENSURE ENLIVE)  237 mL Oral BID BM  . magic mouthwash  5 mL Oral QID   And  . lidocaine  5 mL Mouth/Throat QID   Continuous Infusions:    LOS: 16 days    Time spent: 25 minutes    Barton Dubois, MD [facility] Pager [phone]  If 7PM-7AM, please contact night-coverage [email or web address] Password [facility] 05/12/2017, 5:05 PM

## 2017-05-12 NOTE — Progress Notes (Signed)
Olevia Bowens, MD notified of patient's HR 150s-160s irregular. HR has been 90s-110 irregular. New orders received.

## 2017-05-12 NOTE — Clinical Social Work Note (Signed)
Telephone call with [place] [employer]/APS. She stated was headed in to a meeting to discuss patient with supervisor and county attorney.  LCSW advised that [facility] could take patient if they could be guaranteed funds.  [place] to folllow up with LCSW in regards to plan for patient.   Khayden Herzberg, Clydene Pugh, LCSW

## 2017-05-13 DIAGNOSIS — R748 Abnormal levels of other serum enzymes: Secondary | ICD-10-CM

## 2017-05-13 DIAGNOSIS — E038 Other specified hypothyroidism: Secondary | ICD-10-CM

## 2017-05-13 DIAGNOSIS — I5022 Chronic systolic (congestive) heart failure: Secondary | ICD-10-CM

## 2017-05-13 DIAGNOSIS — R778 Other specified abnormalities of plasma proteins: Secondary | ICD-10-CM

## 2017-05-13 DIAGNOSIS — G309 Alzheimer's disease, unspecified: Secondary | ICD-10-CM

## 2017-05-13 DIAGNOSIS — R7989 Other specified abnormal findings of blood chemistry: Secondary | ICD-10-CM

## 2017-05-13 MED ORDER — OXYCODONE HCL 20 MG/ML PO CONC
5.0000 mg | ORAL | Status: DC
Start: 2017-05-13 — End: 2017-05-13
  Filled 2017-05-13: qty 1

## 2017-05-13 MED ORDER — POLYVINYL ALCOHOL 1.4 % OP SOLN: 2.0000 [drp] | OPHTHALMIC | Status: DC | PRN

## 2017-05-13 MED ORDER — FENTANYL CITRATE (PF) 100 MCG/2ML IJ SOLN
12.5000 ug | INTRAMUSCULAR | Status: DC | PRN
  Administered 2017-05-13: 12.5 ug via INTRAVENOUS
  Filled 2017-05-13: qty 2

## 2017-05-13 MED ORDER — ATROPINE SULFATE 1 % OP SOLN
2.0000 [drp] | Freq: Three times a day (TID) | OPHTHALMIC | Status: DC | PRN
  Filled 2017-05-13: qty 2

## 2017-05-13 MED ORDER — OXYCODONE HCL 20 MG/ML PO CONC: 5.0000 mg | ORAL | Status: DC | PRN

## 2017-05-13 MED ORDER — MORPHINE SULFATE (CONCENTRATE) 10 MG/0.5ML PO SOLN
2.5000 mg | ORAL | Status: DC
Start: 2017-05-13 — End: 2017-05-13

## 2017-05-13 MED ORDER — MORPHINE SULFATE 10 MG/5ML PO SOLN: 2.5000 mg | ORAL | Status: DC | PRN

## 2017-05-15 NOTE — Clinical Social Work Note (Signed)
Late entry for 05/12/17: LCSW sent updated clinicals to [place].

## 2017-05-15 NOTE — Clinical Social Work Note (Signed)
LCSW left a message requesting return contact regarding referral with Beth at [facility].    Kariah Loredo, Clydene Pugh, LCSW

## 2017-05-15 NOTE — Progress Notes (Signed)
Daily Progress Note   Patient Name: Tina West       Date: 11-Jun-2017 DOB: Jan 22, 1929  Age: 84 y.o. MRN#: [id number] Attending Physician: Orson Eva, MD Primary Care Physician: Claretta Fraise, MD Admit Date: 04/24/2017  Reason for Consultation/Follow-up: Establishing goals of care, Inpatient hospice referral and Terminal Care  Subjective: FULL COMFORT CARE as agreed on by DSS SW, Billey Co, (interim guardianship), Sallye Lat HCPOA/grand daughter in law, and Simone Curia, guardian ad litem. Discussion rt GOA, full comfort care, and disposition with hospitalist, Dr. Carles Collet.   Tina West is lying in the bed.  She does not interact with me in any way, and appears to be actively dying.  Her condition is reviewed with in house SW, [place], and granddaughter/HC POA Abigail Butts.  All parties are agreeable for comfort measures only.  Orders for comfort medication in place.  Conference with in-house Education officer, museum. Conference with grand daughter-in-law Abigail Butts via phone. Conference with DSS social worker, Billey Co via phone. Left voicemail message for guardian ad litem Windell Hummingbird.   Length of Stay: 17  Current Medications: Scheduled Meds:  . feeding supplement (ENSURE ENLIVE)  237 mL Oral BID BM  . magic mouthwash  5 mL Oral QID   And  . lidocaine  5 mL Mouth/Throat QID    Continuous Infusions:   PRN Meds: acetaminophen **OR** acetaminophen, bisacodyl, fentaNYL (SUBLIMAZE) injection, hydrALAZINE, LORazepam, ondansetron **OR** ondansetron (ZOFRAN) IV, RESOURCE THICKENUP CLEAR  Physical Exam  Constitutional: No distress.  Appears frail, acutely/chronically ill, actively dying  HENT:  Head: Atraumatic.  Temporal wasting  Cardiovascular: Normal rate.    Pulmonary/Chest: Effort normal. No respiratory distress.  Abdominal: Soft. She exhibits no distension.  Musculoskeletal: She exhibits no edema.  Neurological:  Does not open eyes to voice or touch, does not make eye contact, does not try to make her needs known  Skin: Skin is warm and dry.  Wounds as noted by nursing  Nursing note and vitals reviewed.           Vital Signs: BP 109/77 (BP Location: Right Arm)   Pulse 80   Temp (!) 101 F (38.3 C) (Axillary)   Resp 18   Ht 5\' 9"  (2.811 m)   Wt 59.8 kg (131 lb 13.4 oz)   SpO2 100%   BMI 19.47  kg/m  SpO2: SpO2: 100 % O2 Device: O2 Device: Nasal Cannula O2 Flow Rate: O2 Flow Rate (L/min): 2 L/min  Intake/output summary:   Intake/Output Summary (Last 24 hours) at 2017-05-19 1047 Last data filed at 05/19/2017 6761 Gross per 24 hour  Intake 0 ml  Output -  Net 0 ml   LBM: Last BM Date: 05/10/17 Baseline Weight: Weight: 68 kg (150 lb) Most recent weight: Weight: 59.8 kg (131 lb 13.4 oz)       Palliative Assessment/Data:    Flowsheet Rows     Most Recent Value  Intake Tab  Referral Department  Hospitalist  Unit at Time of Referral  Med/Surg Unit  Palliative Care Primary Diagnosis  Cardiac  Date Notified  04/27/17  Palliative Care Type  New Palliative care  Reason for referral  Clarify Goals of Care  Date of Admission  05/09/2017  Date first seen by Palliative Care  04/27/17  # of days Palliative referral response time  0 Day(s)  # of days IP prior to Palliative referral  1  Clinical Assessment  Psychosocial & Spiritual Assessment  Palliative Care Outcomes      Patient Active Problem List   Diagnosis Date Noted  . Counseling regarding advanced care planning and goals of care   . Encounter for hospice care discussion   . Pressure injury of skin 04/29/2017  . Palliative care encounter   . FTT (failure to thrive) in adult 04/25/2017  . Hypokalemia 05/09/2017  . Hypomagnesemia 04/22/2017  . Acute on chronic  systolic CHF (congestive heart failure) ([place]) 05/12/2017  . Dehydration 05/02/2017  . PAD (peripheral artery disease) ([place]) 03/16/2017  . Acute systolic CHF (congestive heart failure) ([place]) 03/14/2017  . Demand ischemia ([place]) 03/13/2017  . Acute on chronic diastolic (congestive) heart failure ([place]) 03/13/2017  . Thrombocytosis ([place]) 03/13/2017  . Protein-calorie malnutrition, severe 03/13/2017  . A-fib ([place]) 03/12/2017  . Left leg cellulitis 03/12/2017  . Hypothyroidism 03/12/2017  . Other specified hypothyroidism 01/12/2015  . Alzheimer's dementia 01/12/2015  . CARDIOMYOPATHY, ALCOHOLIC 95/11/3265  . Essential hypertension 04/05/2009  . PERSONAL HISTORY OF SUDDEN CARDIAC ARREST 04/05/2009    Palliative Care Assessment & Plan   Patient Profile: 84 y.o.femalewith past medical history of dementia, mixed HF with an EF of 15-20%, PAD, obesity, and anxietywho was admitted on 2/10/2019after a fall at home. She was reported to be found on a mattress on the floor incontinent of urine and feces. Admission work up showed a WBC of 25 and BNP of 2443. Clinically she appeared volume depleted.  Assessment: Failure to thrive,dementia, UTI;being treated with IV antibiotics, lost IV site but was able to access midline IV. Continues to be unable to take adequate nutrition, dysphasia 1 diet ordered, but patient continues to pocket food. GI does not recommend PEG tube.  Recommendations/Plan:  Tina West has had a major decline overnight, today she will likely qualifyfor residential hospice.  Conference with DSS social worker, they are also agreeable to placement at [facility], but will defer prefer residential hospice. There is concern about payer source. In-house social work Dentist with [employer] work.   Goals of Care and Additional Recommendations:  Limitations on Scope of Treatment: Full Comfort Care  Code Status:    Code Status Orders  (From admission,  onward)        Start     Ordered   05/07/17 1354  Do not attempt resuscitation (DNR)  Continuous    Question Answer Comment  In  the event of cardiac or respiratory ARREST Do not call a "code blue"   In the event of cardiac or respiratory ARREST Do not perform Intubation, CPR, defibrillation or ACLS   In the event of cardiac or respiratory ARREST Use medication by any route, position, wound care, and other measures to relive pain and suffering. May use oxygen, suction and manual treatment of airway obstruction as needed for comfort.      05/07/17 1353    Code Status History    Date Active Date Inactive Code Status Order ID Comments User Context   04/28/2017 13:21 05/07/2017 13:53 Partial Code [id number]  Lorenso Quarry Inpatient   04/28/2017 13:15 04/28/2017 13:21 Partial Code [id number]  Lorenso Quarry Inpatient   05/06/2017 18:09 04/28/2017 13:15 Full Code [id number]  Erline Hau, MD Inpatient   03/12/2017 18:14 03/27/2017 17:21 Full Code [id number]  Debbe Odea, MD ED    Advance Directive Documentation     Most Recent Value  Type of Advance Directive  Healthcare Power of Attorney  Pre-existing out of facility DNR order (yellow form or pink MOST form)  No data  "MOST" Form in Place?  No data       Prognosis:   < 2 weeks, possibly hours to days.   Discharge Planning:  requesting comfort and dignity at end of life, residential Hospice at [facility] was discussed with nursing staff, social worker in house, Dr. Carles Collet, DSS social worker.  Thank you for allowing the Palliative Medicine Team to assist in the care of this patient.   Time In:  0940 Time Out:  1040 Total Time  60 minutes Prolonged Time Billed  yes       Greater than 50%  of this time was spent counseling and coordinating care related to the above assessment and plan.  Drue Novel, NP  Please contact Palliative Medicine Team phone at [phone] for questions and  concerns.

## 2017-05-15 NOTE — Death Summary Note (Signed)
DEATH SUMMARY   Patient Details  Name: Tina West MRN: [id number] DOB: Jan 29, 1929  Admission/Discharge Information   Admit Date:  04/28/17  Date of Death:    Time of Death:    Length of Stay: June 03, 2022  Referring Physician: Claretta Fraise, MD   Reason(s) for Hospitalization  Failure to Thrive  Diagnoses  Preliminary cause of death:  Secondary Diagnoses (including complications and co-morbidities):  1. Failure to thrive/severe protein calorie malnutrition with concurrent dysphagia. Speech therapy advise for high risk of aspiration, but will allow for dysphagia 1 and nectar thick liquids when fully awake/alert. Not a candidate for PEG.  Palliative medicine was consulted.  Multiple discussions were undertaken with the patient's guardian as well as Tina West. Now pursuing comfort measures only. Will keep her comfortable  2. Severe hypernatremia I.  Following GOC discussion will pursuit comfort care and only. IVF's and artifical support/meds discontinued on 2/21 evening. No further labs.  3. AKI- overall improved. IVF's discontinued. Patient encourage to do her best with PO hydration and nutrition.  4. UTI- due to E Coli (resistant to ciprofloxacin) and Proteus (resistant to Nitrofurantoin); no fever. Complete 7 days of rocephin on 2/20. Will observe off abx's. Currently afebrile.  5. Chronic systolic heart failure with EF 15-20%. Without alarming signs for hypervolemia. Follow daily weights. IVF's now discontinued.  6. Acute on chronic AMS in the setting of Alzheimer's dementia. In a patient with hypernatremia and UTI. Aricept discontinued. Will pursuit comfort care. 7. Hypothyroidism. Synthroid Now discontinued, in the setting of full comfort care. 8. History of atrial fibrillation. Was previously on Eliquis, now pursuing comfort care as per [place] discussion by palliative care, will focus on comfort care and stop any meds not intended for comfort.  9. Pressure injuries of skin to bilateral  buttocks, lumbar spine, and feet: in the setting of poor nutrition and lack of adequate mobility prior to admission. Will continue preventive measures and constant repositioning.     Brief Hospital Course (including significant findings, care, treatment, and services provided and events leading to death)  Tina West is a 84 y.o. year old female who  was admitted from home on 04-28-22 following a fall. She was found to have very poor living conditions at home and an APS report has been opened. She has been diagnosed with failure to thrive with severe protein calorie malnutrition as well as some dehydration in the setting of Alzheimer's dementia. She has multiple medical comorbidities including systolic heart failure with an EF of 15%.  She was initially noted to have some hyponatremia that has now switched to hypernatremia.  Additionally, she appears to have poor swallow capacity and speech saw today with recommendations for dysphagia 1 diet. She had NGT placed yesterday, but pulled this out. GI consulted to evaluate patient who does not recommend PEG tube at this time. She is noted to have a UTI for which she has been started on IV Rocephin with urine notable for E. coli and Proteus.   Despite IVF's and initial aggressive support, patient continue declining and has now been transitioned to comfort care and hospice.   During the hospitalization, palliative medicine was consulted and assisted with this transition and symptom management.      Pertinent Labs and Studies  Significant Diagnostic Studies Dg Chest 2 View  Result Date: 2017/04/28 CLINICAL DATA:  weakness EXAM: CHEST  2 VIEW COMPARISON:  03/19/2017 FINDINGS: Normal cardiac silhouette. Dextroscoliosis of the thoracic spine. Lungs are clear. No pneumothorax. Small LEFT effusion is  noted on lateral radiograph. IMPRESSION: 1. Small LEFT effusion. 2. Dextroscoliosis of thoracic spine Electronically Signed   By: Suzy Bouchard M.D.   On:  05/05/2017 12:11   Ct Head Wo Contrast  Result Date: 05/05/2017 CLINICAL DATA:  Fall 04/23/2017.  Altered mental status. EXAM: CT HEAD WITHOUT CONTRAST TECHNIQUE: Contiguous axial images were obtained from the base of the skull through the vertex without intravenous contrast. COMPARISON:  04/28/2017 FINDINGS: Brain: There is atrophy and chronic small vessel disease changes. No acute intracranial abnormality. Specifically, no hemorrhage, hydrocephalus, mass lesion, acute infarction, or significant intracranial injury. Vascular: No hyperdense vessel or unexpected calcification. Skull: No acute calvarial abnormality. Sinuses/Orbits: Diffuse sinus disease with mucosal thickening diffusely and air-fluid levels in the left maxillary and right sphenoid sinus. Other: None IMPRESSION: No acute intracranial abnormality. Atrophy, chronic microvascular disease. Acute on chronic sinusitis. Electronically Signed   By: Rolm Baptise M.D.   On: 05/05/2017 08:56   Ct Head Wo Contrast  Result Date: 04/27/2017 CLINICAL DATA:  84 year old female with fall and head injury. EXAM: CT HEAD WITHOUT CONTRAST TECHNIQUE: Contiguous axial images were obtained from the base of the skull through the vertex without intravenous contrast. COMPARISON:  None. FINDINGS: Brain: No evidence of acute infarction, hemorrhage, hydrocephalus, extra-axial collection or mass lesion/mass effect. Atrophy, chronic small-vessel white matter ischemic changes and remote right basal ganglia infarct noted. Vascular: Atherosclerotic calcifications noted. Skull: Normal. Negative for fracture or focal lesion. Sinuses/Orbits: No acute finding. Other: None. IMPRESSION: 1. No evidence of acute intracranial abnormality 2. Atrophy, chronic small-vessel white matter ischemic changes and remote right basal ganglia infarct. Electronically Signed   By: Margarette Canada M.D.   On: 05/12/2017 12:04   Dg Chest Port 1 View  Result Date: 05/04/2017 CLINICAL DATA:  NG tube  placement EXAM: PORTABLE CHEST 1 VIEW COMPARISON:  04/21/2017 FINDINGS: Minimal blunting of the left costophrenic angle, raising the possibility of a trace left pleural effusion. No frank interstitial edema. No pneumothorax. The heart is top-normal in size. Enteric tube terminates in the proximal gastric body. IMPRESSION: Enteric tube terminates in the proximal gastric body. Electronically Signed   By: Julian Hy M.D.   On: 05/04/2017 12:59    Microbiology No results found for this or any previous visit (from the past 240 hour(s)).  Lab Basic Metabolic Panel: Recent Labs  Lab 05/09/17 0712  NA 147*  K 3.7  CL 109  CO2 30  GLUCOSE 117*  BUN 33*  CREATININE 0.67  CALCIUM 7.9*   Liver Function Tests: No results for input(s): AST, ALT, ALKPHOS, BILITOT, PROT, ALBUMIN in the last 168 hours. No results for input(s): LIPASE, AMYLASE in the last 168 hours. No results for input(s): AMMONIA in the last 168 hours. CBC: Recent Labs  Lab 05/06/17 1604  WBC 13.6*  HGB 9.4*  HCT 32.6*  MCV 87.6  PLT 927*   Cardiac Enzymes: No results for input(s): CKTOTAL, CKMB, CKMBINDEX, TROPONINI in the last 168 hours. Sepsis Labs: Recent Labs  Lab 05/06/17 1604  WBC 13.6*    Procedures/Operations  none   Tina West 05-15-17, 12:48 PM

## 2017-05-15 NOTE — Progress Notes (Signed)
[facility] called and Tina West took call and said not eligible to donate.  Tina West, Grandaughter inl law has left and body will be taken to morgue until family decides on funeral home.

## 2017-05-15 NOTE — Consult Note (Signed)
   [facility] CM Inpatient Consult   05-17-17  Tina West 1928/12/30 [id number]  Chart reviewed and reveals that the patient has had a decline in her status and now a residential hospice is being explored.  Patient was previously active with [facility].  Will follow if appropriate.  Natividad Brood, RN BSN [facility] Liaison  [phone] business mobile phone Toll free office [phone]

## 2017-05-15 NOTE — Progress Notes (Signed)
Grandaughter-in-law in room and stated patient passed.  No carotid or apical pulse.  T[place]ania Foley second Radiation protection practitioner.  Texted Dr. Carles Collet.

## 2017-05-15 DEATH — deceased

## 2017-05-18 ENCOUNTER — Ambulatory Visit: Payer: Self-pay | Admitting: Licensed Clinical Social Worker

## 2017-05-20 ENCOUNTER — Encounter: Payer: Self-pay | Admitting: Licensed Clinical Social Worker

## 2017-05-20 ENCOUNTER — Other Ambulatory Visit: Payer: Self-pay | Admitting: Licensed Clinical Social Worker

## 2017-05-20 NOTE — Patient Outreach (Signed)
Assessment:  CSW received communication from [employer], [facility]. Tina West informed CSW that recently client had died.  Since client has expired, [facility] CSW will discharge client on 05/20/17 from [place] services.   Plan:  CSW is discharging Tina West on 05/20/17 from [facility] services since client has expired.  CSW to inform Tina West, Case Management Assistant, that Tina West discharged client on 05/20/17 from [facility] services.  CSW to fax physician case closure letter to Tina West informing Tina West that Tina West discharged client on 05/20/17 from [facility] CSW services.  Tina West.Tina West MSW, LCSW Licensed Clinical Social Worker [facility] Care Management [phone]

## 2019-09-08 IMAGING — DX DG CHEST 1V PORT
1 series · 1 of 1 positions shown · non-contrast
Comparison: 03/12/2017

CLINICAL DATA: Shortness of Breath

EXAM:
PORTABLE CHEST 1 VIEW

[chest ap]
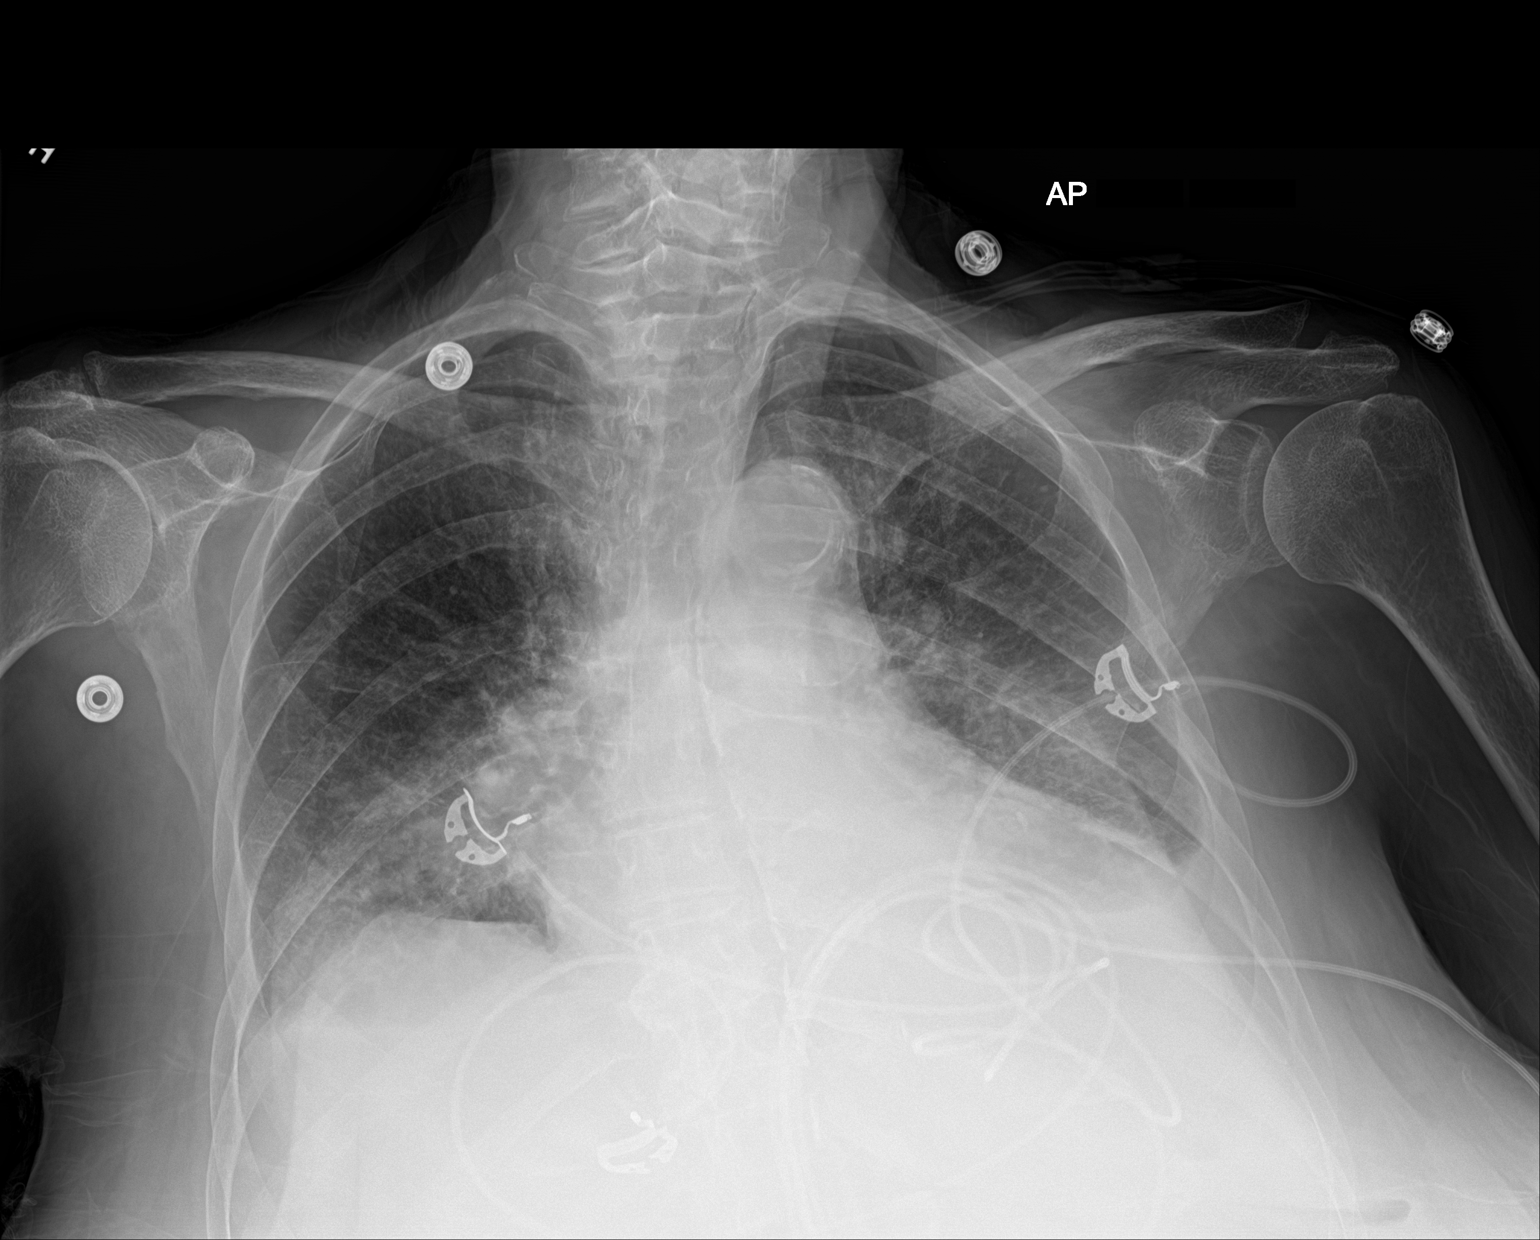

[1 of 1 positions shown; findings below may reference images not displayed]

FINDINGS: Cardiomegaly with vascular congestion. Small bilateral effusions
with bibasilar and perihilar opacities. This could reflect edema or
infection. No acute bony abnormality.
IMPRESSION: Cardiomegaly. Bilateral perihilar and lower lobe edema versus
infection. Small bilateral effusions.
